# Patient Record
Sex: Male | Born: 1935
Health system: Southern US, Community
[De-identification: ages and names within clinical notes are randomized; demographics above are authoritative.]

## PROBLEM LIST (undated history)

## (undated) DIAGNOSIS — Z950 Presence of cardiac pacemaker: Secondary | ICD-10-CM

## (undated) DIAGNOSIS — H269 Unspecified cataract: Secondary | ICD-10-CM

## (undated) DIAGNOSIS — C801 Malignant (primary) neoplasm, unspecified: Secondary | ICD-10-CM

## (undated) DIAGNOSIS — I2510 Atherosclerotic heart disease of native coronary artery without angina pectoris: Secondary | ICD-10-CM

## (undated) DIAGNOSIS — Z8719 Personal history of other diseases of the digestive system: Secondary | ICD-10-CM

## (undated) DIAGNOSIS — N189 Chronic kidney disease, unspecified: Secondary | ICD-10-CM

## (undated) DIAGNOSIS — G4733 Obstructive sleep apnea (adult) (pediatric): Secondary | ICD-10-CM

## (undated) DIAGNOSIS — E785 Hyperlipidemia, unspecified: Secondary | ICD-10-CM

## (undated) DIAGNOSIS — I10 Essential (primary) hypertension: Secondary | ICD-10-CM

## (undated) DIAGNOSIS — G4730 Sleep apnea, unspecified: Secondary | ICD-10-CM

## (undated) DIAGNOSIS — C4490 Unspecified malignant neoplasm of skin, unspecified: Secondary | ICD-10-CM

## (undated) DIAGNOSIS — I4891 Unspecified atrial fibrillation: Secondary | ICD-10-CM

## (undated) DIAGNOSIS — M1990 Unspecified osteoarthritis, unspecified site: Secondary | ICD-10-CM

## (undated) DIAGNOSIS — K219 Gastro-esophageal reflux disease without esophagitis: Secondary | ICD-10-CM

## (undated) HISTORY — DX: Personal history of other diseases of the digestive system: Z87.19

## (undated) HISTORY — PX: JOINT REPLACEMENT: SHX530

## (undated) HISTORY — DX: Chronic kidney disease, unspecified: N18.9

## (undated) HISTORY — PX: UPPER GASTROINTESTINAL ENDOSCOPY: SHX188

## (undated) HISTORY — PX: CORONARY ARTERY BYPASS GRAFT: SHX141

## (undated) HISTORY — PX: TONSILLECTOMY: SUR1361

## (undated) HISTORY — PX: EYE SURGERY: SHX253

## (undated) HISTORY — PX: COLONOSCOPY: SHX174

## (undated) HISTORY — DX: Essential (primary) hypertension: I10

## (undated) HISTORY — DX: Unspecified atrial fibrillation: I48.91

## (undated) HISTORY — DX: Presence of cardiac pacemaker: Z95.0

## (undated) HISTORY — DX: Unspecified malignant neoplasm of skin, unspecified: C44.90

## (undated) HISTORY — DX: Gastro-esophageal reflux disease without esophagitis: K21.9

## (undated) HISTORY — DX: Malignant (primary) neoplasm, unspecified: C80.1

## (undated) HISTORY — DX: Unspecified osteoarthritis, unspecified site: M19.90

## (undated) HISTORY — DX: Obstructive sleep apnea (adult) (pediatric): G47.33

## (undated) HISTORY — PX: SPINE SURGERY: SHX786

## (undated) HISTORY — DX: Unspecified cataract: H26.9

## (undated) HISTORY — DX: Atherosclerotic heart disease of native coronary artery without angina pectoris: I25.10

## (undated) HISTORY — DX: Hyperlipidemia, unspecified: E78.5

## (undated) HISTORY — DX: Sleep apnea, unspecified: G47.30

---

## 2014-08-31 ENCOUNTER — Ambulatory Visit: Admit: 2014-08-31 | Payer: MEDICARE | Attending: Adult Health

## 2014-08-31 DIAGNOSIS — M48061 Spinal stenosis, lumbar region without neurogenic claudication: Secondary | ICD-10-CM

## 2014-08-31 LAB — ABO/RH: Rh Type: POSITIVE

## 2014-08-31 LAB — CBC
Hematocrit: 45.2 % (ref 38.5–50.0)
Hemoglobin: 15.1 g/dL (ref 13.2–17.1)
MCH: 33.7 pg (ref 27.0–33.0)
MCHC: 33.4 g/dL (ref 32.0–36.0)
MCV: 100.9 fL (ref 80.0–100.0)
MPV: 7.9 fL (ref 7.5–11.5)
Platelets: 180 10*3/uL (ref 140–400)
RBC: 4.47 10*6/uL (ref 4.20–5.80)
RDW: 12.9 % (ref 11.0–15.0)
WBC: 6.7 10*3/uL (ref 3.8–10.8)

## 2014-08-31 LAB — URINALYSIS, REFLEX TO CULTURE
Bilirubin, UA: NEGATIVE
Blood, UA: NEGATIVE
Glucose, UA: NEGATIVE mg/dL
Ketones, UA: NEGATIVE mg/dL
Leukocytes, UA: NEGATIVE
Nitrite, UA: NEGATIVE
Protein, UA: NEGATIVE mg/dL
Specific Gravity, UA: 1.015 (ref 1.005–1.035)
Urobilinogen, UA: <2 mg/dL (ref 0.2–1.9)
pH, UA: 7 (ref 5.0–8.0)

## 2014-08-31 LAB — BASIC METABOLIC PANEL
Anion Gap: 6 mmol/L (ref 3–16)
BUN: 22 mg/dL (ref 7–25)
CO2: 28 mmol/L (ref 21–33)
Calcium: 9.5 mg/dL (ref 8.6–10.3)
Chloride: 105 mmol/L (ref 98–110)
Creatinine: 1.14 mg/dL (ref 0.60–1.30)
GFR MDRD Af Amer: 75 See note.
GFR MDRD Non Af Amer: 62 See note.
Glucose: 98 mg/dL (ref 70–100)
Osmolality, Calculated: 291 mOsm/kg (ref 278–305)
Potassium: 4 mmol/L (ref 3.5–5.3)
Sodium: 139 mmol/L (ref 133–146)

## 2014-08-31 LAB — DIFFERENTIAL
Basophils Absolute: 13 /uL (ref 0–200)
Basophils Relative: 0.2 % (ref 0.0–1.0)
Eosinophils Absolute: 235 /uL (ref 15–500)
Eosinophils Relative: 3.5 % (ref 0.0–8.0)
Lymphocytes Absolute: 1869 /uL (ref 850–3900)
Lymphocytes Relative: 27.9 % (ref 15.0–45.0)
Monocytes Absolute: 744 /uL (ref 200–950)
Monocytes Relative: 11.1 % (ref 0.0–12.0)
Neutrophils Absolute: 3839 /uL (ref 1500–7800)
Neutrophils Relative: 57.3 % (ref 40.0–80.0)

## 2014-08-31 LAB — PROTIME-INR
INR: 1 (ref 0.9–1.1)
Protime: 12.8 seconds (ref 11.6–14.4)

## 2014-08-31 LAB — MRSA/STAPH AUREUS DNA - PRE-SURGICAL SCREEN ONLY
MRSA, PCR: NEGATIVE
Staph Aureus, PCR: NEGATIVE

## 2014-08-31 LAB — C-REACTIVE PROTEIN: CRP: <1 mg/L (ref 1.0–10.0)

## 2014-08-31 LAB — SED RATE: Sed Rate: 2 mm/hr (ref 0–20)

## 2014-08-31 LAB — APTT: aPTT: 27.3 seconds (ref 24.3–33.1)

## 2014-08-31 LAB — ANTIBODY SCREEN: Antibody Screen: NEGATIVE

## 2014-08-31 NOTE — Unmapped (Signed)
PRE-OPERATIVE HISTORY AND PHYSICAL       Subjective:     [facility] NP / PA:   Estill Batten, NP    Date of Surgery:  09/17/14  Surgeon:  Dr. Carlis Abbott  Diagnosis:  Spinal stenosis of lumbar region without neurogenic claudication  Procedure:  L4-5 decompression with fusion and fixation and (R) synovial cyst resection using minimally invasive techniques    Patient ID: Craig Foley is a 80 y.o. male.    Patient is being seen today at the request of Dr. Carlis Abbott to render an opinion on perioperative risk optimization and to coordinate medical care as necessary prior to the following procedure: L4-5 decompression with fusion and fixation and (R) synovial cyst resection using minimally invasive techniques.    Chief Complaint   Patient presents with   ??? Pre-op Exam     Spinal stenosis of lumbar region without neurogenic claudication       History of Present Illness:  79 yo man with spinal stenosis of lumbar region without neurogenic claudication. Back pain began earlier this year in May. He states he had mild symptoms 6 months prior to that and he started to take Meloxicam. Reports the pain is sciatica type pain and travels down RLE with associated numbness/tingling. He rates current pain at 1/10 in severity and burning and stabbing in quality. He states with ambulation pain can rise to 7-8/10. It is constant in duration. He states standing still or walking exacerbates the pain and only resting improves it. He denies falls and uses no ambulation assist devices. He takes NSAIDs on occasion for the pain with poor relief. He denies N/V, fevers/chills, and weight loss. He is to undergo L4-5 decompression with fusion and fixation and (R) synovial cyst resection using minimally invasive techniques and is seen today in pre-op.    Chronic Medical Conditions, Severity, Optimization:    1. Cardiac risk/functional status: Denies CHF. +CAD with CABG x4 in 2003. He is on ASA, BB, and statin. Per his cardiologist he is OK to hold ASA pre-op. He  follows with Dr. Stephanie Acre cardiologist with [facility] with recent OV 07/2014.  ECHO 06/2012 with EF 55%, patient was in atrial flutter at the time of test. Stress showed no reversible ischemia in 2013 with EF 63%.   1a. Atrial flutter: underwent successful ablation in 2013. He is on BB and ASA 81 mg. EKG 07/2014 SB at 56 bpm with 1st degree AVB.   2. HTN: on atenolol and lisinopril.   3. HLD: on statin.   4. GERD: on omeprazole with good control of symptoms.   5. Dysphagia: states he takes omeprazole for this as well with good control of symptoms but still has dysphagia and choking on occasion. He has had esophageal dilation in the past with the most recent episode in 2010.  6. OSA: on CPAP nightly.     Duke Activity Scale:  4 - Raking leaves; weeding or pushing a power mower.     Medical History:     Past Medical History   Diagnosis Date   ??? Coronary artery disease    ??? Hypertension    ??? HLD (hyperlipidemia)    ??? Atrial flutter    ??? GERD (gastroesophageal reflux disease)    ??? OSA on CPAP    ??? Cancer      skin- BCC on (L) ear     Surgical History:     Past Surgical History   Procedure Laterality Date   ??? Coronary  artery bypass graft  2003     x4   ??? Cardiac electrophysiology study and ablation  2013     atrial flutter   ??? Esophageal dilation  2010   ??? Eye surgery Bilateral      cataracts   ??? Endoscopic release transverse carpal ligament of hand Bilateral    ??? Vein ligation and stripping     ??? Skin cancer excision       Family History:     Family History   Problem Relation Age of Onset   ??? Cancer Mother      breast   ??? Heart failure Father    ??? Diabetes Other      Social History:     History     Social History   ??? Marital Status: Married     Spouse Name: N/A     Number of Children: N/A   ??? Years of Education: N/A     Occupational History   ??? Not on file.     Social History Main Topics   ??? Smoking status: Former Smoker -- 1.00 packs/day for 20 years     Quit date: 11/30/1973   ??? Smokeless tobacco: Not on file   ???  Alcohol Use: 1.2 oz/week     2 Glasses of wine per week   ??? Drug Use: No   ??? Sexual Activity: Not on file     Other Topics Concern   ??? Not on file     Social History Narrative   ??? No narrative on file     Allergies:   No Known Allergies    Medications:     Prior to Admission medications taking for visit date 08/31/14   Medication Sig Taking? Authorizing Provider   atenolol (TENORMIN) 25 MG tablet Take 25 mg by mouth daily. Yes Historical Provider, MD   atorvastatin (LIPITOR) 20 MG tablet Take 20 mg by mouth daily. Yes Historical Provider, MD   lisinopril (PRINIVIL,ZESTRIL) 2.5 MG tablet Take 2.5 mg by mouth daily. Yes Historical Provider, MD   LORazepam (ATIVAN) 1 MG tablet Take 1 mg by mouth 3 times a day as needed for Anxiety. Yes Historical Provider, MD   omeprazole (PRILOSEC) 20 MG capsule Take 20 mg by mouth every morning before breakfast. Yes Historical Provider, MD   aspirin 81 MG EC tablet Take 81 mg by mouth daily.  Historical Provider, MD   coenzyme Q10 (CO Q-10) 10 mg capsule Take 10 mg by mouth daily.  Historical Provider, MD   fish oil-omega-3 fatty acids 300-1,000 mg capsule Take 1 g by mouth daily.  Historical Provider, MD   meloxicam (MOBIC) 15 MG tablet Take 15 mg by mouth daily.  Historical Provider, MD      Review of Systems   Constitutional: Positive for activity change. Negative for fever, chills, weight loss, weight gain, appetite change and fatigue.   HENT: Positive for dental problem, tinnitus and trouble swallowing. Negative for congestion, ear pain, hearing loss, mouth sores, nosebleeds, postnasal drip, rhinorrhea, sinus pressure, sneezing and sore throat.         Chronic tinnitus  Missing teeth, none loose  H/o dysphagia- past dilation-omeprazole helps   Eyes: Negative for pain and visual disturbance.   Respiratory: Positive for apnea and choking. Negative for cough, chest tightness, shortness of breath and wheezing.         +OSA- on CPAP  Denies orthopnea- 1 pillow  Choking with esophageal  problem   Cardiovascular:  Negative for chest pain, palpitations and leg swelling.   Gastrointestinal: Positive for heartburn. Negative for nausea, vomiting, abdominal pain, diarrhea, constipation, blood in stool and bloating.        GERD well controlled on PPI   Genitourinary: Negative for dysuria, urgency, frequency, hematuria, decreased urine volume, difficulty urinating and nocturia.   Musculoskeletal: Positive for back pain. Negative for arthralgias, gait problem, neck pain and neck stiffness.   Skin: Negative for rash and wound.   Neurological: Positive for numbness. Negative for dizziness, tremors, seizures, syncope, weakness, light-headedness and headaches.   Hematological: Bruises/bleeds easily.        Bruise easily- on ASA     Objective:   Blood pressure 150/68, pulse 54, temperature 97.7 ??F (36.5 ??C), temperature source Oral, resp. rate 12, height 5' 10 (1.778 m), weight 233 lb (105.688 kg), SpO2 100.00%.    Physical Exam   Constitutional: He is oriented to person, place, and time. He appears well-developed and well-nourished. He is active.  Non-toxic appearance. He does not have a sickly appearance. He does not appear ill. No distress.   Body mass index is 33.43 kg/(m^2).  Wife present   HENT:   Head: Normocephalic and atraumatic.   Mouth/Throat: Uvula is midline, oropharynx is clear and moist and mucous membranes are normal. He does not have dentures. Abnormal dentition.   Missing teeth, none loose per pt   Eyes: Conjunctivae, EOM and lids are normal. Pupils are equal, round, and reactive to light. No scleral icterus.   Neck: Normal range of motion. Neck supple. Carotid bruit is not present. Normal range of motion present. No thyromegaly present.   Cardiovascular: Regular rhythm, S1 normal, S2 normal and normal heart sounds.  Bradycardia present.    No murmur heard.  Pulses:       Radial pulses are 2+ on the right side, and 2+ on the left side.        Posterior tibial pulses are 2+ on the right side,  and 2+ on the left side.   Trace ankle edema (L) > (R)   Pulmonary/Chest: Effort normal and breath sounds normal.   Abdominal: Soft. Bowel sounds are normal. There is no tenderness.   Musculoskeletal:   Strength 5/5 BUE/BLE   Lymphadenopathy:        Head (right side): No submental, no submandibular, no tonsillar, no preauricular, no posterior auricular and no occipital adenopathy present.        Head (left side): No submental, no submandibular, no tonsillar, no preauricular, no posterior auricular and no occipital adenopathy present.     He has no cervical adenopathy.   Neurological: He is alert and oriented to person, place, and time. He has normal strength. No cranial nerve deficit or sensory deficit. GCS eye subscore is 4. GCS verbal subscore is 5. GCS motor subscore is 6.   Skin: Skin is warm, dry and intact.   Psychiatric: He has a normal mood and affect. His speech is normal and behavior is normal. Judgment and thought content normal. Cognition and memory are normal.       Airway:  Mallampati III (soft and hard palate and base of uvula visible), Thyromental distance 2 finger breadths, opening 3 finger breadths. Slightly decreased neck extension. Missing teeth, none loose.     Lab Review:     Lab Results   Component Value Date    WBC 6.7 08/31/2014     Study Results:    TEE 07/2012:  Findings:  Aortic root  normal size   Aortic valve morphology normal trace regurgitation  Mitral and tricuspid valve morphology normal  Mild mitral regurgitation,two small jets noted  No tricuspid regurgitation  Interatrial septum shows lipomatous hypertrophy no evidence for   PFO On color doppler bubble study not done  L.v. Function normal  No pericardial effusion  Left atrial appendage seen well no evidence for thrombus  Pulmonary vein doppler ,no systolic reversal  IMP; normal lv function  Mild mitral regurgitation  No left atrial appendage thrombus  Recommendations:  proceeed with flutter ablation    TTE 06/2012:  FINDINGS   Real  time imaging shows normal LV size, wall motion and an ejection fraction of 55%. Biatrial enlargement is noted and left atrial enlargement is moderate in severity. There is questionable RV enlargement. Mitral and aortic valves open normally.   Doppler study shows trace mitral and tricuspid insufficiency. PA pressure was not estimated due to lack of adequate Doppler signal.   CONCLUSIONS   1. Normal left ventricular function.  2. Biatrial enlargement, moderate left atrial enlargement.  3. Questionable right ventricular enlargement.  4. No valve disease.   5. Patient is in atrial flutter during this study    Stress 2013:  Impression:  1. Nonischemic clinical response to regadenoson stress.  2. Nonischemic electrocardiographic response to regadenoson stress.  3. Abnormal myocardial perfusion imaging study.  - Small basal inferolateral infarct without ischemia.  Small apicolateral nontransmural infarct without ischemia.  - Normal left ventricular cavity size. LVEDV 107 ML.  - Normal wall motion.  - Ejection fraction was calculated at 63% poststress     ASA Physical Status:  3    Assessment and Recommendations:   80 yo man with  Spinal stenosis of lumbar region without neurogenic claudication seen preoperatively to L4-5 decompression with fusion and fixation and (R) synovial cyst resection using minimally invasive techniques under [place]. Concurrent medical conditions include:    1. Cardiac risk/functional status: Denies CHF. +CAD with CABG x4 in 2003. He is on ASA, BB, and statin. Per his cardiologist he is OK to hold ASA pre-op. He follows with Dr. Stephanie Acre cardiologist with [facility] with recent OV 07/2014.  ECHO 06/2012 with EF 55%, patient was in atrial flutter at the time of test. Stress showed no reversible ischemia in 2013 with EF 63%. Denies CP, SOB, and orthopnea. Duke activity score 4; he has stairs at home and states he can climb 2 flights without symptoms. Until the spring when back pain worsened he was walking  on the treadmill on occasion but not regularly without symptoms. RCRI=1 (ischemic HD).     1a. Atrial flutter: underwent successful ablation in 2013. He is on BB and ASA 81 mg. EKG 07/2014 SB at 56 bpm with 1st degree AVB. HR 54 bpm today and RRR.    2. HTN: on atenolol and lisinopril. BP today 150/68. Advised to take BB AM of OR and to hold ACEI.    3. HLD: on statin. Advised to continue perioperatively.    4. GERD: on omeprazole with good control of symptoms. Advised to take AM of OR.    5. Dysphagia: states he takes omeprazole for this as well with good control of symptoms but still has dysphagia and choking on occasion. He has had esophageal dilation in the past with the most recent episode in 2010.    6. OSA: on CPAP nightly. Advised to bring AM of OR.     This patient's medical history, assessment and plan of  care was discussed with Dr. Monica Becton. No further testing indicated prior to surgery.     Today I obtained T&S, CBC w/diff, BMP, coags, ESR, CRP, MRSA screen, and UA/C&S.  Pre-procedural instructions given, patient verbalized understanding.    Estill Batten, CNP

## 2014-08-31 NOTE — Unmapped (Signed)
Pre-Procedure Instructions    We???re pleased that you have chosen [facility] for your upcoming procedure.  The staff serving you is professionally trained to provide the highest quality care.  We encourage you to ask questions and to let the staff know your special needs.  We want your visit to be as comfortable as possible.    Your procedure is scheduled on 10/19 at 1230 PM.  Please arrive at 1030 AM and check in at the surgery waiting room on the second floor of the main hospital.      DO NOT EAT OR DRINK ANYTHING (including gum, mints, water, etc.) after midnight the night before your procedure.  You may brush your teeth and gargle on the morning of surgery, but do not swallow any water with the exception of the following medication: Atenolol (with a small sip of water).    YOU MAY TAKE YOUR GERD, SEIZURE, ANXIETY/DEPRESSION AND PAIN MEDICATIONS, WITH A SMALL SIP OF WATER, DAY OF SURGERY, AS PRESCRIBED BY YOUR PHYSICIANS.  FOLLOW YOUR PHYSICIAN'S INSTRUCTIONS CONCERNING DISCONTINUING ASPIRIN, IBUPROFEN, NSAIDS, SUPPLEMENTS, FISH OIL, VITAMINS, AND HERBAL SUPPLEMENTS.    Please make transportation arrangements and bring a responsible adult to accompany you home and remain with you for 24 hours.    Leave valuables (money, jewelry, credit cards) at home.  If you wear glasses or contacts, bring a case for safekeeping.    Wear casual, loose fitting, and comfortable clothing.  A gown will be provided.  If you are staying overnight, bring a small overnight bag.  (Storage space is limited.)    Please remove all makeup, jewelry, body piercings, powder, perfume, and nail polish before you arrive.    Bring a list of your medications and dose including herbal.  Do not bring any pills or medications to the hospital. (Exception: transplant patients.)    Bring a photo ID and your insurance card so we can bill your insurance company directly.    Please do not bring any children under the age of 38 to the hospital.     Do not shave in the area of the surgery for 2 days prior to surgery.  If needed, a trained staff member will clip the area immediately before your surgery.    Quit smoking as far in advance of surgery as possible.  Patients who quit at least 30 days before surgery may have better outcomes.    If you are diabetic, pay close attention to your blood sugar and try to keep it in the range your doctor wants it to be in.      Discuss discontinuing herbal medications with your doctor before surgery.    Talk to your doctor about taking medication such as Aspirin, Plavix, Pradaxa or Coumadin before surgery.    Please shower at home the evening before and the morning of surgery using an antibacterial soap.    If you have a cold or are sick prior to surgery, contact your doctor before surgery.    Additional instructions:    Patient/Family provided education about surgical site infection prevention.    IF, you are positive for Staph, &/or possibly MRSA, you will need to use Bactroban intranasally for 3 days prior to surgery.  Bactroban is used twice a day in each nares for 3 days prior to surgery.  You will be contacted by a Pre Admission Testing RN at [facility] to notify you of this, and a script will be called in to your pharmacy.  You will be provided Hibiclens for preop shower. You will use this soap to your operative site for 3 days in row prior to surgery.    Patient verbalized understanding of these instructions.    1. Enter main entrance of hospital.   2. Go straight back to set of elevators.  3. Take elevator to the second floor.  4. Left off elevator to the Surgical Services Desk.    Make sure all of your health care givers are checking your ID bracelet and verifying your name and date of birth.  You will actively be involved in verifying the type of surgery you are having and the correct site.  Your health care givers should be cleaning their hands with soap and water or antibacterial foam before taking  care of you and if they do not it is ok to remind them to do so.      Antibacterial showering and good hand hygiene are essential to prevent surgical site infections.    Patient given educational information/material on preventing surgical site infections.    Patient verbalized understanding of these instructions.    Contact information:    [facility] Pre-admission Testing, Monday - Friday 8:00 am - 4:30 pm, [phone].

## 2014-09-17 ENCOUNTER — Inpatient Hospital Stay
Admit: 2014-09-17 | Discharge: 2014-09-19 | Disposition: A | Discharge status: Home Health | Payer: MEDICARE | Source: Ambulatory Visit

## 2014-09-17 ENCOUNTER — Inpatient Hospital Stay: Admit: 2014-09-17 | Payer: MEDICARE

## 2014-09-17 ENCOUNTER — Inpatient Hospital Stay: Admit: 2014-09-18 | Payer: MEDICARE

## 2014-09-17 DIAGNOSIS — M4806 Spinal stenosis, lumbar region: Secondary | ICD-10-CM

## 2014-09-17 MED ORDER — phenylephrine (NEO-SYNEPHRINE) injection
10 | INTRAMUSCULAR | Status: AC | PRN
Start: 2014-09-17 — End: 2014-09-17
  Administered 2014-09-17: 20:00:00 100 via INTRAVENOUS
  Administered 2014-09-17: 21:00:00 200 via INTRAVENOUS
  Administered 2014-09-17: 20:00:00 100 via INTRAVENOUS
  Administered 2014-09-17: 22:00:00 200 via INTRAVENOUS
  Administered 2014-09-17: 19:00:00 50 via INTRAVENOUS
  Administered 2014-09-17 (×2): 200 via INTRAVENOUS
  Administered 2014-09-17 (×2): 50 via INTRAVENOUS
  Administered 2014-09-17 (×3): 200 via INTRAVENOUS
  Administered 2014-09-17: 20:00:00 100 via INTRAVENOUS
  Administered 2014-09-17: 20:00:00 200 via INTRAVENOUS

## 2014-09-17 MED ORDER — oxyCODONE-acetaminophen (PERCOCET) 5-325 mg per tablet 2 tablet
5-325 | ORAL | Status: AC | PRN
Start: 2014-09-17 — End: 2014-09-19
  Administered 2014-09-18 – 2014-09-19 (×3): 2 via ORAL

## 2014-09-17 MED ORDER — flosealhemostaticsealant
Status: AC
Start: 2014-09-17 — End: 2014-09-17

## 2014-09-17 MED ORDER — LORazepam (ATIVAN) tablet 1 mg
1 | Freq: Three times a day (TID) | ORAL | Status: AC | PRN
Start: 2014-09-17 — End: 2014-09-19

## 2014-09-17 MED ORDER — lisinopril (PRINIVIL,ZESTRIL) tablet 2.5 mg
2.5 | Freq: Every day | ORAL | Status: AC
Start: 2014-09-17 — End: 2014-09-19
  Administered 2014-09-19: 14:00:00 2.5 mg via ORAL

## 2014-09-17 MED ORDER — vancomycin (VANCOCIN) injection
1000 | INTRAVENOUS | Status: AC
Start: 2014-09-17 — End: 2014-09-17

## 2014-09-17 MED ORDER — thrombin (bovine) SolR
5000 | TOPICAL | Status: AC | PRN
Start: 2014-09-17 — End: 2014-09-17
  Administered 2014-09-17: 21:00:00 5000

## 2014-09-17 MED ORDER — atorvastatin (LIPITOR) tablet 20 mg
20 | Freq: Every evening | ORAL | Status: AC
Start: 2014-09-17 — End: 2014-09-19
  Administered 2014-09-19: 01:00:00 20 mg via ORAL

## 2014-09-17 MED ORDER — lactated ringers irrigation solution
Status: AC | PRN
Start: 2014-09-17 — End: 2014-09-17
  Administered 2014-09-17: 21:00:00 1000

## 2014-09-17 MED ORDER — remifentanil (ULTIVA) 1 mg in sodium chloride 0.9 % 20 mL IV
2 | INTRAVENOUS | Status: AC | PRN
Start: 2014-09-17 — End: 2014-09-17
  Administered 2014-09-17: 21:00:00 0.1 mg via INTRAVENOUS

## 2014-09-17 MED ORDER — ondansetron (ZOFRAN) 4 mg/2 mL injection 4 mg
4 | Freq: Three times a day (TID) | INTRAMUSCULAR | Status: AC | PRN
Start: 2014-09-17 — End: 2014-09-19
  Administered 2014-09-18 (×2): 4 mg via INTRAVENOUS

## 2014-09-17 MED ORDER — lactated ringers infusion
INTRAVENOUS | Status: AC
Start: 2014-09-17 — End: 2014-09-17
  Administered 2014-09-17: 16:00:00 20 mL/h via INTRAVENOUS
  Administered 2014-09-17: 19:00:00 via INTRAVENOUS

## 2014-09-17 MED ORDER — phenylephrine (NEO-SYNEPHRINE) 10 mg/mL injection
10 | INTRAMUSCULAR | Status: AC
Start: 2014-09-17 — End: ?

## 2014-09-17 MED ORDER — lidocaine-EPINEPHrine 1 %-1:100,000 injection
1 | INTRAMUSCULAR | Status: AC | PRN
Start: 2014-09-17 — End: 2014-09-17
  Administered 2014-09-17: 19:00:00 10

## 2014-09-17 MED ORDER — propofol 10 mg/ml (DIPRIVAN) 10 mg/mL injection
10 | INTRAVENOUS | Status: AC
Start: 2014-09-17 — End: ?

## 2014-09-17 MED ORDER — methocarbamol (ROBAXIN) injection 1,000 mg
100 | Freq: Four times a day (QID) | INTRAMUSCULAR | Status: AC
Start: 2014-09-17 — End: 2014-09-18
  Administered 2014-09-18 (×4): 1000 mg via INTRAVENOUS

## 2014-09-17 MED ORDER — ceFAZolin (ANCEF) IVPB 2 g in D5W (duplex)
2 | Freq: Three times a day (TID) | INTRAVENOUS | Status: AC
Start: 2014-09-17 — End: 2014-09-18
  Administered 2014-09-18 (×2): 2 g via INTRAVENOUS

## 2014-09-17 MED ORDER — HYDROmorphone (DILAUDID) injection Syrg
2 | INTRAMUSCULAR | Status: AC | PRN
Start: 2014-09-17 — End: 2014-09-17
  Administered 2014-09-17 (×3): 1 via INTRAVENOUS

## 2014-09-17 MED ORDER — atenolol (TENORMIN) tablet 25 mg
25 | Freq: Every day | ORAL | Status: AC
Start: 2014-09-17 — End: 2014-09-19
  Administered 2014-09-19: 14:00:00 25 mg via ORAL

## 2014-09-17 MED ORDER — EPINEPHRINE 50MCG/10ML INTRA-OP SYRINGE FOR BRADYCARDIA
Status: AC
Start: 2014-09-17 — End: ?

## 2014-09-17 MED ORDER — bacitracin 50,000 unit injection
50000 | INTRAMUSCULAR | Status: AC
Start: 2014-09-17 — End: 2014-09-17

## 2014-09-17 MED ORDER — HYDROmorphone (DILAUDID) injection Syrg 0.5 mg
0.5 | INTRAMUSCULAR | Status: AC | PRN
Start: 2014-09-17 — End: 2014-09-19
  Administered 2014-09-18: 15:00:00 0.5 mg via INTRAVENOUS

## 2014-09-17 MED ORDER — HYDROmorphone (DILAUDID) 2 mg/mL injection Syrg
2 | INTRAMUSCULAR | Status: AC
Start: 2014-09-17 — End: ?

## 2014-09-17 MED ORDER — lactated ringers infusion
INTRAVENOUS | Status: AC
Start: 2014-09-17 — End: 2014-09-17

## 2014-09-17 MED ORDER — EPINEPHRINE 50 MCG/ 10 ML SYRINGE FOR BRADYCARDIA
Status: AC | PRN
Start: 2014-09-17 — End: 2014-09-17
  Administered 2014-09-17 (×3): 10 via INTRAVENOUS

## 2014-09-17 MED ORDER — fentaNYL (SUBLIMAZE) injection 50 mcg
50 | INTRAMUSCULAR | Status: AC | PRN
Start: 2014-09-17 — End: 2014-09-17

## 2014-09-17 MED ORDER — senna (SENOKOT) tablet 1 tablet
8.6 | Freq: Two times a day (BID) | ORAL | Status: AC
Start: 2014-09-17 — End: 2014-09-19
  Administered 2014-09-19 (×2): 1 via ORAL

## 2014-09-17 MED ORDER — HYDROmorphone (DILAUDID) injection Syrg 0.2 mg
0.5 | INTRAMUSCULAR | Status: AC | PRN
Start: 2014-09-17 — End: 2014-09-17

## 2014-09-17 MED ORDER — glycopyrrolate (ROBINUL) injection
0.2 | INTRAMUSCULAR | Status: AC | PRN
Start: 2014-09-17 — End: 2014-09-17
  Administered 2014-09-17 (×2): 0.2 via INTRAVENOUS

## 2014-09-17 MED ORDER — sodium phosphates (FLEET'S) Enema 118 mL
19-7 | Freq: Every day | RECTAL | Status: AC | PRN
Start: 2014-09-17 — End: 2014-09-19

## 2014-09-17 MED ORDER — thrombin-fibrinogen (TISSEEL-VH) 4 mL Kit
4 | TOPICAL | Status: AC | PRN
Start: 2014-09-17 — End: 2014-09-17
  Administered 2014-09-17: 21:00:00 10

## 2014-09-17 MED ORDER — HYDROmorphone (DILAUDID) injection Syrg 0.4 mg
0.5 | INTRAMUSCULAR | Status: AC | PRN
Start: 2014-09-17 — End: 2014-09-17
  Administered 2014-09-17 (×2): 0.4 mg via INTRAVENOUS

## 2014-09-17 MED ORDER — fentaNYL (SUBLIMAZE) injection
50 | INTRAMUSCULAR | Status: AC | PRN
Start: 2014-09-17 — End: 2014-09-17
  Administered 2014-09-17: 19:00:00 100 via INTRAVENOUS

## 2014-09-17 MED ORDER — ondansetron (ZOFRAN) tablet 4 mg
4 | Freq: Three times a day (TID) | ORAL | Status: AC | PRN
Start: 2014-09-17 — End: 2014-09-19

## 2014-09-17 MED ORDER — floseal hemostatic sealant
Status: AC | PRN
Start: 2014-09-17 — End: 2014-09-17
  Administered 2014-09-17: 19:00:00 10 via TOPICAL

## 2014-09-17 MED ORDER — ceFAZolin (ANCEF) IVPB 2 g in D5W (duplex)
2 | INTRAVENOUS | Status: AC | PRN
Start: 2014-09-17 — End: 2014-09-17
  Administered 2014-09-17: 19:00:00 2 g via INTRAVENOUS

## 2014-09-17 MED ORDER — methocarbamol (ROBAXIN) injection
100 | INTRAMUSCULAR | Status: AC | PRN
Start: 2014-09-17 — End: 2014-09-17
  Administered 2014-09-17: 22:00:00 1000 via INTRAVENOUS

## 2014-09-17 MED ORDER — zolpidem (AMBIEN) tablet 5 mg
5 | Freq: Every evening | ORAL | Status: AC | PRN
Start: 2014-09-17 — End: 2014-09-19

## 2014-09-17 MED ORDER — lactated ringers infusion
INTRAVENOUS | Status: AC
Start: 2014-09-17 — End: ?

## 2014-09-17 MED ORDER — lidocaine (PF) 2% (20 mg/mL) 20 mg/mL (2 %) Soln
20 | INTRAMUSCULAR | Status: AC
Start: 2014-09-17 — End: ?

## 2014-09-17 MED ORDER — sodium chloride 0.9 % infusion
INTRAVENOUS | Status: AC
Start: 2014-09-17 — End: ?

## 2014-09-17 MED ORDER — oxyCODONE-acetaminophen (PERCOCET) 5-325 mg per tablet 1 tablet
5-325 | ORAL | Status: AC | PRN
Start: 2014-09-17 — End: 2014-09-19

## 2014-09-17 MED ORDER — lidocaine (PF) 20 mg/mL (2 %) Soln
20 | INTRAVENOUS | Status: AC | PRN
Start: 2014-09-17 — End: 2014-09-17
  Administered 2014-09-17: 19:00:00 60 via INTRAVENOUS

## 2014-09-17 MED ORDER — rocuronium (ZEMURON) 10 mg/mL injection
10 | INTRAVENOUS | Status: AC
Start: 2014-09-17 — End: ?

## 2014-09-17 MED ORDER — succinylcholine (QUELICIN) injection
20 | INTRAMUSCULAR | Status: AC | PRN
Start: 2014-09-17 — End: 2014-09-17
  Administered 2014-09-17: 19:00:00 100 via INTRAVENOUS

## 2014-09-17 MED ORDER — succinylcholine (QUELICIN) 20 mg/mL injection
20 | INTRAMUSCULAR | Status: AC
Start: 2014-09-17 — End: ?

## 2014-09-17 MED ORDER — fentaNYL (SUBLIMAZE) injection 12.5 mcg
50 | INTRAMUSCULAR | Status: AC | PRN
Start: 2014-09-17 — End: 2014-09-17

## 2014-09-17 MED ORDER — gelatin adsorbable (GELFOAM) 100 sponge
100 | TOPICAL | Status: AC
Start: 2014-09-17 — End: 2014-09-17

## 2014-09-17 MED ORDER — phenylephrine (NEO-SYNEPHRINE) 10 mg in sodium chloride 0.9 % 250 mL infusion
10 | INTRAMUSCULAR | Status: AC | PRN
Start: 2014-09-17 — End: 2014-09-17
  Administered 2014-09-17: 21:00:00 70 mg via INTRAVENOUS

## 2014-09-17 MED ORDER — methocarbamol (ROBAXIN) tablet 500-1,000 mg
500 | Freq: Four times a day (QID) | ORAL | Status: AC | PRN
Start: 2014-09-17 — End: 2014-09-19

## 2014-09-17 MED ORDER — sodium chloride 0.9% 0.9 %
INTRAMUSCULAR | Status: AC
Start: 2014-09-17 — End: ?

## 2014-09-17 MED ORDER — 0.9 % NaCl with KCl 20 mEq infusion
20 | INTRAVENOUS | Status: AC
Start: 2014-09-17 — End: 2014-09-19
  Administered 2014-09-18 (×2): 75 mL/h via INTRAVENOUS

## 2014-09-17 MED ORDER — docusate sodium (COLACE) capsule 100 mg
100 | Freq: Two times a day (BID) | ORAL | Status: AC
Start: 2014-09-17 — End: 2014-09-19
  Administered 2014-09-19 (×2): 100 mg via ORAL

## 2014-09-17 MED ORDER — propofol 10 mg/ml (DIPRIVAN) injection
10 | INTRAVENOUS | Status: AC | PRN
Start: 2014-09-17 — End: 2014-09-17
  Administered 2014-09-17: 19:00:00 20 via INTRAVENOUS
  Administered 2014-09-17: 19:00:00 150 via INTRAVENOUS

## 2014-09-17 MED ORDER — ondansetron (ZOFRAN) 4 mg/2 mL injection
4 | INTRAMUSCULAR | Status: AC
Start: 2014-09-17 — End: ?

## 2014-09-17 MED ORDER — ondansetron (ZOFRAN) 4 mg/2 mL injection 4 mg
4 | Freq: Three times a day (TID) | INTRAMUSCULAR | Status: AC | PRN
Start: 2014-09-17 — End: 2014-09-17

## 2014-09-17 MED ORDER — HYDROmorphone (DILAUDID) injection Syrg 0.6 mg
1 | INTRAMUSCULAR | Status: AC | PRN
Start: 2014-09-17 — End: 2014-09-17
  Administered 2014-09-17 (×2): 0.6 mg via INTRAVENOUS

## 2014-09-17 MED ORDER — heparin (porcine) injection 5,000 Units
5000 | Freq: Three times a day (TID) | INTRAMUSCULAR | Status: AC
Start: 2014-09-17 — End: 2014-09-19
  Administered 2014-09-18 – 2014-09-19 (×5): 5000 [IU] via SUBCUTANEOUS

## 2014-09-17 MED ORDER — thrombin-fibrinogen (TISSEEL-VH) 10 mL Kit
10 | TOPICAL | Status: AC
Start: 2014-09-17 — End: 2014-09-17

## 2014-09-17 MED ORDER — HYDROmorphone (DILAUDID) injection Syrg 1 mg
1 | INTRAMUSCULAR | Status: AC | PRN
Start: 2014-09-17 — End: 2014-09-19
  Administered 2014-09-18 – 2014-09-19 (×4): 1 mg via INTRAVENOUS

## 2014-09-17 MED ORDER — fentaNYL (SUBLIMAZE) injection 25 mcg
50 | INTRAMUSCULAR | Status: AC | PRN
Start: 2014-09-17 — End: 2014-09-17
  Administered 2014-09-17 (×3): 25 ug via INTRAVENOUS

## 2014-09-17 MED ORDER — bisacodyl (DULCOLAX) EC tablet 10 mg
5 | Freq: Every day | ORAL | Status: AC | PRN
Start: 2014-09-17 — End: 2014-09-19

## 2014-09-17 MED ORDER — bupivacaine (PF)(SENSORCAINE/MARCAINE) 0.5% injection
0.5 | INTRAMUSCULAR | Status: AC | PRN
Start: 2014-09-17 — End: 2014-09-17
  Administered 2014-09-17: 19:00:00 20
  Administered 2014-09-17: 22:00:00 30 via SUBCUTANEOUS

## 2014-09-17 MED ORDER — nalOXone (NARCAN) injection 0.04 mg
0.4 | INTRAMUSCULAR | Status: AC | PRN
Start: 2014-09-17 — End: 2014-09-17

## 2014-09-17 MED ORDER — methocarbamol (ROBAXIN) 100 mg/mL injection
100 | INTRAMUSCULAR | Status: AC
Start: 2014-09-17 — End: ?

## 2014-09-17 MED ORDER — gelatin adsorbable (GELFOAM) sponge
100 | TOPICAL | Status: AC | PRN
Start: 2014-09-17 — End: 2014-09-17
  Administered 2014-09-17 (×2): 1 via TOPICAL

## 2014-09-17 MED ORDER — bacitracin 50,000 Units in sodium chloride, irrigation 0.9 % 1,000 mL IRRIGATION
50000 | INTRAMUSCULAR | Status: AC | PRN
Start: 2014-09-17 — End: 2014-09-17
  Administered 2014-09-17: 19:00:00 50000 [IU]

## 2014-09-17 MED ORDER — magnesium hydroxide (MILK OF MAGNESIA) Susp 10 mL
2400 | Freq: Two times a day (BID) | ORAL | Status: AC | PRN
Start: 2014-09-17 — End: 2014-09-19

## 2014-09-17 MED ORDER — remifentanil (ULTIVA) 1 mg injection
1 | INTRAVENOUS | Status: AC
Start: 2014-09-17 — End: ?

## 2014-09-17 MED ORDER — sodium chloride 0.9 % flush 10 mL
INTRAMUSCULAR | Status: AC
Start: 2014-09-17 — End: 2014-09-19
  Administered 2014-09-18 – 2014-09-19 (×5): 10 mL via INTRAVENOUS

## 2014-09-17 MED ORDER — rocuronium (ZEMURON) injection
10 | INTRAVENOUS | Status: AC | PRN
Start: 2014-09-17 — End: 2014-09-17
  Administered 2014-09-17: 19:00:00 20 via INTRAVENOUS

## 2014-09-17 MED ORDER — sodium chloride 0.9 % infusion
INTRAVENOUS | Status: AC
Start: 2014-09-17 — End: 2014-09-17

## 2014-09-17 MED ORDER — glycopyrrolate (ROBINUL) 0.2 mg/mL injection
0.2 | INTRAMUSCULAR | Status: AC
Start: 2014-09-17 — End: ?

## 2014-09-17 MED ORDER — pantoprazole (PROTONIX) EC tablet 40 mg
40 | Freq: Every day | ORAL | Status: AC
Start: 2014-09-17 — End: 2014-09-19
  Administered 2014-09-18 – 2014-09-19 (×2): 40 mg via ORAL

## 2014-09-17 MED ORDER — calcium carbonate (TUMS) chewable tablet 1,000 mg
200 | ORAL | Status: AC | PRN
Start: 2014-09-17 — End: 2014-09-19

## 2014-09-17 MED ORDER — thrombin (bovine) 5,000 unit SolR
5000 | TOPICAL | Status: AC
Start: 2014-09-17 — End: 2014-09-17

## 2014-09-17 MED FILL — POTASSIUM CHLORIDE 20 MEQ/L IN 0.9 % SODIUM CHLORIDE INTRAVENOUS: 20 20 mEq/L | INTRAVENOUS | Qty: 1000

## 2014-09-17 MED FILL — PHENYLEPHRINE 10 MG/ML INJECTION SOLUTION: 10 10 mg/mL | INTRAMUSCULAR | Qty: 1

## 2014-09-17 MED FILL — BACITRACIN 50,000 UNIT INTRAMUSCULAR SOLUTION: 50000 50,000 unit | INTRAMUSCULAR | Qty: 1

## 2014-09-17 MED FILL — HYDROMORPHONE 2 MG/ML INJECTION SYRINGE: 2 2 mg/mL | INTRAMUSCULAR | Qty: 1

## 2014-09-17 MED FILL — SODIUM CHLORIDE 0.9 % INJECTION SOLUTION: INTRAMUSCULAR | Qty: 10

## 2014-09-17 MED FILL — ROCURONIUM 10 MG/ML INTRAVENOUS SOLUTION: 10 10 mg/mL | INTRAVENOUS | Qty: 5

## 2014-09-17 MED FILL — LACTATED RINGERS INTRAVENOUS SOLUTION: INTRAVENOUS | Qty: 1000

## 2014-09-17 MED FILL — HYDROMORPHONE 1 MG/ML INJECTION SYRINGE: 1 1 mg/mL | INTRAMUSCULAR | Qty: 1

## 2014-09-17 MED FILL — CEFAZOLIN 2 GRAM/50 ML IN DEXTROSE (ISO-OSMOTIC) INTRAVENOUS PIGGYBACK: 2 2 gram/50 mL | INTRAVENOUS | Qty: 50

## 2014-09-17 MED FILL — LACTATED RINGERS INTRAVENOUS SOLUTION: 20.00 20.00 mL/hr | INTRAVENOUS | Qty: 1000

## 2014-09-17 MED FILL — FLOSEAL HEMOSTATIC SEALANT: Qty: 10

## 2014-09-17 MED FILL — ONDANSETRON HCL (PF) 4 MG/2 ML INJECTION SOLUTION: 4 4 mg/2 mL | INTRAMUSCULAR | Qty: 2

## 2014-09-17 MED FILL — ROBAXIN 100 MG/ML INJECTION SOLUTION: 100 100 mg/mL | INTRAMUSCULAR | Qty: 10

## 2014-09-17 MED FILL — PROPOFOL 10 MG/ML INTRAVENOUS EMULSION: 10 10 mg/mL | INTRAVENOUS | Qty: 20

## 2014-09-17 MED FILL — LIDOCAINE (PF) 20 MG/ML (2 %) INJECTION SOLUTION: 20 20 mg/mL (2 %) | INTRAMUSCULAR | Qty: 5

## 2014-09-17 MED FILL — QUELICIN 20 MG/ML INJECTION SOLUTION: 20 20 mg/mL | INTRAMUSCULAR | Qty: 10

## 2014-09-17 MED FILL — EPINEPHRINE 50MCG/10ML INTRA-OP SYRINGE FOR BRADYCARDIA: Qty: 10

## 2014-09-17 MED FILL — SODIUM CHLORIDE 0.9 % INTRAVENOUS SOLUTION: INTRAVENOUS | Qty: 250

## 2014-09-17 MED FILL — ULTIVA 1 MG INTRAVENOUS SOLUTION: 1 1 mg | INTRAVENOUS | Qty: 1

## 2014-09-17 MED FILL — FENTANYL (PF) 50 MCG/ML INJECTION SOLUTION: 50 50 mcg/mL | INTRAMUSCULAR | Qty: 2

## 2014-09-17 MED FILL — VANCOMYCIN 1,000 MG INTRAVENOUS INJECTION: 1000 1000 mg | INTRAVENOUS | Qty: 1000

## 2014-09-17 MED FILL — THROMBIN-JMI 5,000 UNIT TOPICAL SOLUTION: 5000 5,000 unit | TOPICAL | Qty: 1

## 2014-09-17 MED FILL — GLYCOPYRROLATE 0.2 MG/ML INJECTION SOLUTION: 0.2 0.2 mg/mL | INTRAMUSCULAR | Qty: 2

## 2014-09-17 MED FILL — TISSEEL VHSD 10 ML TOPICAL KIT: 10 10 mL | TOPICAL | Qty: 1

## 2014-09-17 MED FILL — LACTATED RINGERS INTRAVENOUS SOLUTION: INTRAVENOUS | Qty: 2000

## 2014-09-17 MED FILL — GELFOAM SPONGE SIZE 100 100: 100 | TOPICAL | Qty: 1

## 2014-09-17 NOTE — Unmapped (Signed)
Anesthesia Extubation Criteria:    Airway Device: endotracheal tube    Emergence Details:      Smooth      _x_      Stormy       __       Prolonged   __     Extubation Criteria:      Motor strength intact       _x_      Follows commands        _x_      Good airway reflexes      _x_      OP suctioned                  _x_        Follows commands:  Yes     Patient extubated:  Yes

## 2014-09-17 NOTE — Unmapped (Signed)
Procedures  NEUROMONITORING REPORT  [facility]        PATIENT NAME: Craig Foley  DATE OF BIRTH: [birth year]  MEDICAL RECORD NO.: [id number]  CSN: [id number]  DATE OF PROCEDURE: 09/17/2014    Diagnosis: Lumbar spinal stenosis  Procedure: L4-5 decompression, fusion, fixation, minimally invasive       Anesthetic: Desflurane, Remifentanil  Surgeon: Lonny Prude, MD  Neuromonitoring physician: P. Accalia Rigdon  Monitoring Type: Intraoperative Neurophysiological Monitoring  Monitoring Time: Start 3:02 pm       Stop 5:41 pm  Modalities Requested By Surgeon: SSEP, EMG  Modalities Suggested by Monitoring Team: Same      Brief Clinical History:  The patient is a 80 year old man with back pain radiating down his right leg with associated numbness and tingling.      Assessment:  1. Baseline upper and lower extremity SSEP recordings were well defined and reproducible.  During surgery, there was a transient decrease in the right upper extremity SSEP responses that improved quickly with repositioning. There were no other clinically significant amplitude decreases or latency increases. At the end of surgery amplitude and latencies at all peaks remained near baseline with only slight changes due to anesthetic effect.  2. Free running EMG was recorded during surgery without difficulties. There were no prolonged bursts or trains of neurotonic discharges during surgery.  3. Triggered EMG was performed during surgery without difficulties. No low threshold CMAPs were recorded during pedicle screw stimulation.  Stimulation thresholds were: Left L4 20 mA, L5 20 mA; Right (tap) L4 20 mA, L5 20 mA  4. Train of four EMG responses were monitored during surgery without difficulty.  Amplitudes of the peaks remained similar without fade from the first to the fourth peak at the onset of surgery but were not obtainable during a portion of the case following administration of neuromuscular blockade.  Prior to pedicle screw stimulation adequate  responses were obtained.        Somatosensory Evoked Potentials (SSEP)    Somatosensory pathways were monitored during surgery by somatosensory evoked potentials.  Sensory responses were measured from peripheral nerve, brainstem, and somatosensory cortex.  Stimulation and recording parameters are noted in the technology report. Amplitude and latency were measured at appropriate peaks for each recording montage. The surgeon and anesthesiologist were immediately alerted if any amplitudes decreased by >50% or latencies increased by > 10% from baseline.    Lower Extremities  Frequent intermittent recordings were performed utilizing sequential right and left stimulation of the posterior tibial nerves using needle electrodes at the ankles. Each tracing was produced by an average of approximately 300 stimulations with recording montages PFleft-PFright, CSp2-Fz, C3???-C4??? and Fz-Cz???.    Upper Extremities  Frequent intermittent recordings were performed utilizing sequential right and left stimulation of the ulnar nerves using needle electrodes at the wrists.  Each tracing was produced by an average of approximately 300 stimulations with recording montages EPleft-EPright, CSp2-Fz, C3???-C4???, C3???-Cz??? and C4???-Cz???.          Free Running Electromyography (EMG)    Nerve roots near the spinal cord were monitored continuously during surgery with free running electromyography.  Neurogenic responses were measured from compound muscle action potentials in the upper and lower extremities and trunk. Recording parameters are noted in the technology report. The surgeon was immediately alerted if trains of neurotonic discharges or if high amplitude high frequency bursts appeared during surgery around the spine, spinal cord, or nerves.      Lower Extremities  Continuous  recording was performed utilizing needle electrodes in the following muscles bilaterally:  Vastus Lateralus (L2, L3, L4), Tibialis Anterior (L4, L5), Peroneus Longus (L5, S1),  Popliteus (L5, S1), Medial Gastrocnemius (S1, S2).          Triggered Electromyography (EMG) pedicle screw stimulation    The integrity of the vertebral bodies with respect to pedicle screw placements were monitored intermittently by direct stimulation of pedicle screws. Stimulation and recording parameters are noted in the technology report.  Compound muscle action potentials were recorded bilaterally from the muscles as noted in the section, Free Running EMG.     After placement of the pedicle screws, the monopolar stimulator probe was applied to the hexagonal port of the screw shank.  All efforts are made to keep the surgical field as dry as possible to avoid significant current shunting.  Before testing the pedicle screws, EMG was recorded to confirm integrity of the stimulating-recording equipment.  Pedicle screw stimulation started at 0.5 mAmp and was increased in 0.5 mAmp increments until 20 mAmp was reached.  If burst EMG activity was evoked with currents less than 8 mAmp, the surgeon was immediately alerted.  If no burst EMG activity was detected at current levels up to 20 mAmp the surgeon was so informed.  The surgeon was notified of the mAmp threshold that evoked burst EMG activity between 8 and 20 mAmp for all pedicle screws.         Train of Four responses (TOF)    The degree of neuromuscular blockade was monitored intermittently during surgery to confirm the reliability of EMG responses. Compound muscle action potential was recorded from the extensor hallucis longus after a train-of-four stimulation of the left posterior tibial nerve. Stimulation and recording parameters are noted in the technology report.  The anesthesiologist was immediately alerted if of the four amplitudes were absent or there was attenuation of the amplitude from the first to the fourth peaks.                         BASELINE UPPER SSEP      BASELINE LOWER SSEP      BASELINE EMG      BASELINE TOF      CLOSING UPPER  SSEP      CLOSING LOWER SSEP      CLOSING EMG      TOF                  Craig Foley  09/17/2014

## 2014-09-17 NOTE — Unmapped (Signed)
Anesthesia Transfer of Care Note    Patient: Craig Foley  Procedure(s) Performed: Procedure(s):  LUMBAR 4-5 DECOMPRESSION WITH FUSION AND FIXATION AND RIGHT SYNOVIAL CYST RESECTION USING MINIMALLY INVASIVE TECHNIQUES    Patient location: PACU    Post pain: Adequate analgesia    Post assessment: no apparent anesthetic complications, tolerated procedure well and no evidence of recall    Post vital signs:    Filed Vitals:    09/17/14 1825   BP: 121/78   Pulse: 77   Temp: 97.5   Resp: 23   SpO2: 98%       Level of consciousness: awake, alert  and oriented    Complications: None

## 2014-09-17 NOTE — Unmapped (Signed)
Anesthesia Post Note    Patient: Craig Foley    Procedure(s) Performed: Procedure(s):  LUMBAR 4-5 DECOMPRESSION WITH FUSION AND FIXATION AND RIGHT SYNOVIAL CYST RESECTION USING MINIMALLY INVASIVE TECHNIQUES    Anesthesia type: general endotracheal    Patient location: PACU    Post pain: Adequate analgesia    Post assessment: no apparent anesthetic complications and tolerated procedure well    Last Vitals:   Filed Vitals:    09/17/14 1915   BP: 135/65   Pulse: 74   Temp:    Resp: 18   SpO2: 100%       Post vital signs: stable    Level of consciousness: awake    Complications: None

## 2014-09-17 NOTE — Unmapped (Signed)
[place]  DEPARTMENT OF ANESTHESIOLOGY  PRE-PROCEDURAL EVALUATION    Craig Foley is a 80 y.o. year old male presenting for:    Procedure(s):  LUMBAR 4-5 DECOMPRESSION WITH FUSION AND FIXATION AND RIGHT SYNOVIAL CYST RESECTION USING MINIMALLY INVASIVE TECHNIQUES    Surgeon:   Lonny Prude, MD    Chief Complaint     <principal problem not specified>    Review of Systems     Anesthesia Evaluation    Patient summary reviewed.  All other systems reviewed and are negative.     No history of anesthetic complications   I have reviewed the History and Physical Exam, any relevant changes are noted in the anesthesia pre-operative evaluation.      Cardiovascular:    Exercise tolerance: good  Duke Met score: 4 - Raking leaves. Weeding or pushing a power mower.  (+) CAD, CABG/stent (CABG 2003) and dysrhythmias (hx atrial flutter s/p successful ablation).  Hypertension is well controlled.    (-) angina, CHF.  ROS comment: Echo 2013  CONCLUSIONS 1.  Normal left ventricular function.2.  Biatrial enlargement, moderate left atrial enlargement.3.  Questionable right ventricular enlargement.4.  No valve disease. 5.  Patient is in atrial flutter during this study.    Negative stress test 2013    Neuro/Muscoloskeletal/Psych:    (+) back problem.    (-) seizures, CVA.     Pulmonary:      (-) shortness of breath.       GI/Hepatic/Renal:    GERD is well controlled.    (-) liver disease, renal disease.    Endo/Other:        (-) diabetes mellitus.       Past Medical History     Past Medical History   Diagnosis Date   ??? Coronary artery disease    ??? Hypertension    ??? HLD (hyperlipidemia)    ??? Atrial flutter    ??? GERD (gastroesophageal reflux disease)    ??? OSA on CPAP    ??? Cancer      skin- BCC on (L) ear       Past Surgical History     Past Surgical History   Procedure Laterality Date   ??? Coronary artery bypass graft  2003     x4   ??? Cardiac electrophysiology study and ablation  2013     atrial flutter   ??? Esophageal dilation  2010   ??? Eye  surgery Bilateral      cataracts   ??? Endoscopic release transverse carpal ligament of hand Bilateral    ??? Vein ligation and stripping     ??? Skin cancer excision         Family History     Family History   Problem Relation Age of Onset   ??? Cancer Mother      breast   ??? Heart failure Father    ??? Diabetes Other        Social History     History     Social History   ??? Marital Status: Married     Spouse Name: N/A     Number of Children: N/A   ??? Years of Education: N/A     Occupational History   ??? Not on file.     Social History Main Topics   ??? Smoking status: Former Smoker -- 1.00 packs/day for 20 years     Quit date: 11/30/1973   ??? Smokeless tobacco: Never Used   ???  Alcohol Use: 1.2 oz/week     2 Glasses of wine per week   ??? Drug Use: No   ??? Sexual Activity: Not on file     Other Topics Concern   ??? Not on file     Social History Narrative   ??? No narrative on file       Medications     Allergies:  No Known Allergies    Home Meds:  Prior to Admission medications as of 09/17/14 1136   Medication Sig Taking?   atenolol (TENORMIN) 25 MG tablet Take 25 mg by mouth daily. Yes   atorvastatin (LIPITOR) 20 MG tablet Take 20 mg by mouth daily. Yes   lisinopril (PRINIVIL,ZESTRIL) 2.5 MG tablet Take 2.5 mg by mouth daily. Yes   LORazepam (ATIVAN) 1 MG tablet Take 1 mg by mouth 3 times a day as needed for Anxiety. Yes   omeprazole (PRILOSEC) 20 MG capsule Take 20 mg by mouth every morning before breakfast. Yes   aspirin 81 MG EC tablet Take 81 mg by mouth daily.    coenzyme Q10 (CO Q-10) 10 mg capsule Take 10 mg by mouth daily.    fish oil-omega-3 fatty acids 300-1,000 mg capsule Take 1 g by mouth daily.    meloxicam (MOBIC) 15 MG tablet Take 15 mg by mouth daily.        Inpatient Meds:  Scheduled:   ??? bacitracin       ??? floseal hemostatic sealant       ??? gelatin adsorbable       ??? thrombin (bovine)       ??? vancomycin         Continuous:   ??? lactated ringers 20 mL/hr (09/17/14 1156)   ??? sodium chloride         PRN: ceFAZolin (ANCEF)  IVPB    Vital Signs     Wt Readings from Last 3 Encounters:   09/17/14 225 lb 4.8 oz (102.195 kg)   09/17/14 225 lb 4.8 oz (102.195 kg)   08/31/14 233 lb (105.688 kg)     Ht Readings from Last 3 Encounters:   09/17/14 5' 10 (1.778 m)   09/17/14 5' 10 (1.778 m)   08/31/14 5' 10 (1.778 m)     Temp Readings from Last 3 Encounters:   09/17/14 98.4 ??F (36.9 ??C) Temporal   09/17/14 98.4 ??F (36.9 ??C) Temporal   08/31/14 97.7 ??F (36.5 ??C) Oral     BP Readings from Last 3 Encounters:   09/17/14 128/52   09/17/14 128/52   08/31/14 150/68     Pulse Readings from Last 3 Encounters:   09/17/14 51   09/17/14 51   08/31/14 54     SpO2 Readings from Last 3 Encounters:   09/17/14 99%   09/17/14 99%   08/31/14 100%       Physical Exam     Airway:     Mallampati: III  Mouth Opening: >2 FB  TM distance: > = 3 FB  Neck ROM: full    Dental:   - normal exam     Pulmonary:        Cardiovascular:       Neuro/Musculoskeletal/Psych:    Mental status: alert and oriented to person, place and time.          Abdominal:       Current OB Status:       Other Findings:        Laboratory Data  Lab Results   Component Value Date    WBC 6.7 08/31/2014    HGB 15.1 08/31/2014    HCT 45.2 08/31/2014    MCV 100.9* 08/31/2014    PLT 180 08/31/2014       No results found for this basename: ABORH       Lab Results   Component Value Date    GLUCOSE 98 08/31/2014    BUN 22 08/31/2014    CO2 28 08/31/2014    CREATININE 1.14 08/31/2014    K 4.0 08/31/2014    NA 139 08/31/2014    CL 105 08/31/2014    CALCIUM 9.5 08/31/2014       Lab Results   Component Value Date    INR 1.0 08/31/2014       No results found for this basename: PREGTESTUR, PREGSERUM, HCG, HCGQUANT       Anesthesia Plan     ASA 3     Anesthesia Type:  general endotracheal.     Intravenous induction.    Anesthetic plan and risks discussed with patient and spouse.    Plan, alternatives, and risks of anesthesia, including death, have been explained to and discussed with the patient/legal guardian.  By my  assessment, the patient/legal guardian understands and agrees.  Scenario presented in detail.  Questions answered.    Use of blood products discussed with patient and spouse whom consented to blood products.   Plan discussed with CRNA.

## 2014-09-17 NOTE — Unmapped (Signed)
Problem: Inadequate Airway Clearance  Goal: Patient will maintain patent airway  Assess and monitor breath sounds, cough and sputum (if present), and intake/output. Collaborate with respiratory therapy to administer medications and treatments.   Incentive Spirometry ordered to assist in lung expansion and improve oxygenation.

## 2014-09-17 NOTE — Unmapped (Signed)
Faxed brace order to [facility]. Left message for call back

## 2014-09-17 NOTE — Unmapped (Signed)
Ancef 2gm IVPB available at bedside for anesthesia.

## 2014-09-17 NOTE — Unmapped (Signed)
LUMBAR 4-5 DECOMPRESSION WITH FUSION AND FIXATION AND RIGHT SYNOVIAL CYST RESECTION USING MINIMALLY INVASIVE TECHNIQUES  Procedure Note    Craig Foley  09/17/2014      Pre-op Diagnosis: SPINAL STENOSIS OF LUMBAR REGION WITHOUT NEUROGENIC CLAUDICATION       Post-op Diagnosis: same as above, intraoperative CSF leak repaired    Procedure(s):  LUMBAR 4-5 DECOMPRESSION WITH FUSION AND FIXATION AND RIGHT SYNOVIAL CYST RESECTION USING MINIMALLY INVASIVE TECHNIQUES, Cerebrospinal fluid leak repair      Surgeon(s):  Lonny Prude, MD    Anesthesia: General    Staff:   Circulator: Rae Roam, RN  Radiology Technologist: Malena Catholic, RT  Relief Circulator: Ihor Dow, RN  Relief Scrub: Leonette Monarch, ST  Assistant: Dwain Sarna, CSA  Float: Renaldo Harrison, CSA  Resident: Rance Muir, MD  Preop Nurse: Jones Broom, RN    Estimated Blood Loss: less than 100 mL                 Specimens:   ID Type Source Tests Collected by Time Destination   A : Synovial cyst Tissue Tissue SURGICAL PATHOLOGY EXAM Lonny Prude, MD 09/17/2014  4:12 PM               Drains:             There were no complications unless listed below. Intraoperative CSF leak, primarily repaired        [name]     Date: 09/17/2014  Time: 6:09 PM

## 2014-09-17 NOTE — Unmapped (Signed)
Recovery care complete. No distress. Resting comfortably. 0 score obtained via OPAS scale. Tolerating ice chips. Lying flat per order. Sign out received from Dr. Doreene Burke. Report called to Olegario Messier, [place]

## 2014-09-17 NOTE — Unmapped (Signed)
Call back from technician on call from brace shop. Will apply brace in the am.

## 2014-09-18 ENCOUNTER — Inpatient Hospital Stay: Admit: 2014-09-18 | Payer: MEDICARE

## 2014-09-18 LAB — BASIC METABOLIC PANEL
Anion Gap: 6 mmol/L (ref 3–16)
BUN: 19 mg/dL (ref 7–25)
CO2: 25 mmol/L (ref 21–33)
Calcium: 8.6 mg/dL (ref 8.6–10.3)
Chloride: 105 mmol/L (ref 98–110)
Creatinine: 1.07 mg/dL (ref 0.60–1.30)
GFR MDRD Af Amer: 81 See note.
GFR MDRD Non Af Amer: 67 See note.
Glucose: 146 mg/dL (ref 70–100)
Osmolality, Calculated: 287 mOsm/kg (ref 278–305)
Potassium: 4 mmol/L (ref 3.5–5.3)
Sodium: 136 mmol/L (ref 133–146)

## 2014-09-18 LAB — HEMOGLOBIN AND HEMATOCRIT, BLOOD
Hematocrit: 40.4 % (ref 38.5–50.0)
Hemoglobin: 13.6 g/dL (ref 13.2–17.1)
MCH: 34.1 pg (ref 27.0–33.0)
MCHC: 33.8 g/dL (ref 32.0–36.0)
MCV: 101.1 fL (ref 80.0–100.0)
RDW: 12.5 % (ref 11.0–15.0)

## 2014-09-18 MED ORDER — sodium chloride 0.9 % 500 mL bolus
Freq: Once | INTRAVENOUS | Status: AC
Start: 2014-09-18 — End: 2014-09-18
  Administered 2014-09-18: 15:00:00 via INTRAVENOUS

## 2014-09-18 MED FILL — HEPARIN (PORCINE) 5,000 UNIT/ML INJECTION SOLUTION: 5000 5,000 unit/mL | INTRAMUSCULAR | Qty: 1

## 2014-09-18 MED FILL — ROBAXIN 100 MG/ML INJECTION SOLUTION: 100 100 mg/mL | INTRAMUSCULAR | Qty: 10

## 2014-09-18 MED FILL — HYDROMORPHONE 0.5 MG/0.5 ML INJECTION SYRINGE: 0.5 0.5 mg/0.5 mL | INTRAMUSCULAR | Qty: 0.5

## 2014-09-18 MED FILL — SODIUM CHLORIDE 0.9 % INTRAVENOUS SOLUTION: INTRAVENOUS | Qty: 500

## 2014-09-18 MED FILL — CEFAZOLIN 2 GRAM/50 ML IN DEXTROSE (ISO-OSMOTIC) INTRAVENOUS PIGGYBACK: 2 2 gram/50 mL | INTRAVENOUS | Qty: 50

## 2014-09-18 MED FILL — OXYCODONE-ACETAMINOPHEN 5 MG-325 MG TABLET: 5-325 5-325 mg | ORAL | Qty: 2

## 2014-09-18 MED FILL — ONDANSETRON HCL (PF) 4 MG/2 ML INJECTION SOLUTION: 4 4 mg/2 mL | INTRAMUSCULAR | Qty: 2

## 2014-09-18 MED FILL — LISINOPRIL 2.5 MG TABLET: 2.5 2.5 MG | ORAL | Qty: 1

## 2014-09-18 MED FILL — HYDROMORPHONE 1 MG/ML INJECTION SYRINGE: 1 1 mg/mL | INTRAMUSCULAR | Qty: 1

## 2014-09-18 MED FILL — DOCUSATE SODIUM 100 MG CAPSULE: 100 100 MG | ORAL | Qty: 1

## 2014-09-18 MED FILL — SENNA 8.6 MG TABLET: 8.6 8.6 mg | ORAL | Qty: 1

## 2014-09-18 MED FILL — ATORVASTATIN 20 MG TABLET: 20 20 MG | ORAL | Qty: 1

## 2014-09-18 MED FILL — POTASSIUM CHLORIDE 20 MEQ/L IN 0.9 % SODIUM CHLORIDE INTRAVENOUS: 20 20 mEq/L | INTRAVENOUS | Qty: 1000

## 2014-09-18 MED FILL — PANTOPRAZOLE 40 MG TABLET,DELAYED RELEASE: 40 40 MG | ORAL | Qty: 1

## 2014-09-18 MED FILL — ATENOLOL 25 MG TABLET: 25 25 MG | ORAL | Qty: 1

## 2014-09-18 NOTE — Unmapped (Addendum)
D: patient ambulated in halls x 3 today with gait belt and walker. No sx of acute distress. Fall precautions in place. Patient denies nausea with norco. No sx of acute distress. BP better. Patient denies dizziness with evening ambulation.

## 2014-09-18 NOTE — Unmapped (Signed)
Neurosurgery Progress Note    S: No acute events overnight. Back pain, leg pain is gone as far as he can tell.     O:  Temp:  [97.4 ??F (36.3 ??C)-98.4 ??F (36.9 ??C)] 98.2 ??F (36.8 ??C)  Heart Rate:  [48-86] 86  Resp:  [11-23] 16  BP: (103-155)/(51-83) 137/67 mmHg  FiO2:  [42 %-97 %] 94 %    NAD  A&O x 4, FCC  CN grossly intact  MAE well, 5/5 intact  Wound c/d/i    Labs/Imaging:  Heme  Recent Labs      09/18/14   0646   HGB  13.6   HCT  40.4       Chem  No results found for this basename: NA, K, CL, CO2, BUN, CREATININE, GLUCOSE, CALCIUM, PHOS, MG,  in the last 72 hours    Coag  No results found for this basename: PROTIME, INR, APTT,  in the last 72 hours    CT Lumbar spine wo contrast: official read pending, expected postoperative changes with good placement of screws and rods and interbody graft    A/P: POD#1 s/p L4-5 decompression/fixation/fusion with minimally invasive techniques    - Neurologically stable, pain controlled  - Pain control with PO medications today  - Okay to be upright today, okay to mobilize once brace arraives  - CT lumbar spine with expected postoperative changes  - Upright xrays pending in brace  - PT/OT/mobilize  - Advance diet as tolerated  - Bowel regimen  - DVT ppx with SCDS and SQH  - Dispo: pending mobility and pain control, likely d/c tomorrow    Rance Muir, MD  Neurosurgery Resident  [phone]  09/18/2014  7:06 AM

## 2014-09-18 NOTE — Unmapped (Signed)
---   CASE MANAGEMENT NOTE --  Met with patient to discuss discharge planning. Introduced self and role of Sports coach, provided contact information.    Type of Home: one level  Number of Entry Steps: 2 STE 0 HR  Patient Lives With: spouse  24/7 Supervision/Assistance Available at D/C if needed: spouse available at d/c  Level of Activity Prior to Admission: independent without an assistive device  DME Available at Home: RW, [facility]  Home Health Prior to Admission: no, agreeable to [facility] at d/c       Name of Home Health Company: [facility]       Phone: [phone]       Fax: [phone]  Transportation: spouse    Met with patient and spouse, discussed therapy recommendations, both agree with recommendations. Wife states she is available to provide 24/7 supervision/assistance at discharge. HHC choices offered, patient would like referral made to [facility] for SN/PT/OT. Diane at [facility] aware of referral and potential discharge tomorrow. No other needs identified. Patient/spouse understand and agree with discharge plan.    --- DISCHARGE PLAN SUMMARY ---  Home with [facility].  Thurmond Butts, RN, Case Manager, [phone]  CM will continue to follow and remain available for any changing discharge needs.    Patient/Family aware and taking part in the discharge plan.  Patient/family were offered a post-acute provider list as applicable to the discharge plan and insurance provider.  Patient/family were given the freedom to choose providers and financial interest(s) were disclosed as appropriate.

## 2014-09-18 NOTE — Unmapped (Signed)
Physical Therapy  Physical Therapy Initial Assessment and treatment     Name: Craig Foley  DOB: October 06, 1934  Attending Physician: Lonny Prude, MD  Admission Diagnosis: Spondylolisthesis of lumbar region [M43.16]  Date: 09/18/2014  Precautions: falls, foley, LSO when OOB donned while EOB, back precautions  Reviewed Pertinent hospital course: Yes  Hospital Course PT/OT: Pt is a 80 y/o male s/p lumbar 4-5 decompression with fusion and fixation and with R synovial cyst resection  Assessment  Assessment: Impaired Bed Mobility;Impaired Transfer Mobility;Impaired Ambulation;Impaired Balance;Impaired Strength  Prognosis: Good  Goals  Pt Will Go Supine To Sit: Modified Independent (with HOB slightly elevated without use of side rails via log roll technique)  Pt Will Ambulate: Modified Independent (142ft with RW)  Pt Will Go Up / Down Stairs: Contact Guard (2 steps with folded RW without use of handrails)  Pt Will Stand: Contact Guard (from various surfaces to RW while maintaining back precautions)  Miscellaneous Goal #1: Pt will perform HEP for increased activity tolerance LE strength independently  Time frame for goals to be met in: 3 days (09/21/14)   Recommendation  Plan  Treatment/Interventions: LE strengthening/ROM;Endurance training;Patient/family training;Gait Conservation officer, nature;Therapeutic Activity;Therapeutic Exercise  PT Frequency: 3-5x/wk    Recommendation  Recommendation: Home PT;Home with 24 hour supervision/assistance  Equipment Recommended: Patient has needed mobility DME  Problem List  Patient Active Problem List   Diagnosis   ??? Spondylolisthesis of lumbar region      Past Medical History  Past Medical History   Diagnosis Date   ??? Coronary artery disease    ??? Hypertension    ??? HLD (hyperlipidemia)    ??? Atrial flutter    ??? GERD (gastroesophageal reflux disease)    ??? OSA on CPAP    ??? Cancer      skin- BCC on (L) ear      Past Surgical History  Past Surgical History   Procedure  Laterality Date   ??? Coronary artery bypass graft  2003     x4   ??? Cardiac electrophysiology study and ablation  2013     atrial flutter   ??? Esophageal dilation  2010   ??? Eye surgery Bilateral      cataracts   ??? Endoscopic release transverse carpal ligament of hand Bilateral    ??? Vein ligation and stripping     ??? Skin cancer excision       Patient Stated Goals  Goal #1: to return home   Home Living/Prior Function  Type of Home: House  Home Layout: One level;Performs ADL's on one level;Able to live on main level with bedroom/bathroom (2 STE 0 HR 1 step down into bedroom)  Bathroom Shower/Tub: Pension scheme manager: Raised  Bathroom Equipment: Buyer, retail: Agricultural consultant  Level of Independence: Independent  Lives With: Spouse (24/7 assist available upon d/c)  ADL Assistance: Independent  Homemaking/IADL Assistance: Independent  Vocational: Retired Scientist, research (physical sciences))     Pain  Pain Score:   9  Pain Location: Back  Pain Descriptors: Aching  Pain Intervention(s): Medication (See eMAR);Ambulation/increased activity  Therapist reported pain to:: RN aware and notifying    Vision  Current Vision: Wears glasses only for reading    Cognition  Orientation Level: Oriented X4    Sensation  Light Touch: No apparent deficits  Sharp/Dull: No apparent deficits  Proprioception: No apparent deficits  Inattention/Neglect: Appears intact  Initiation: Appears intact  Motor Planning: Appears intact  Perseveration: Not present  Lower Extremity  RLE Assessment  RLE Assessment: Impaired (at least 3/5 based on functional movement screen)  LLE Assessment  LLE Assessment: Impaired (at least 3/5 based on functional movement screen)  Functional Mobility  Bed Mobility Eval  Rolling: Minimal  Supine to Sit: Contact Guard (HOB slightly elevated no use of siderails via log roll technique)  Sit to Supine: Contact Guard (HOB slightly elevated no use of siderails via log roll technique)  Transfers Eval  Sit to Stand: Contact Guard  (from bed level to RW with cues for hand palcement and adherence to back precautions)  Bed to Chair: Contact Guard  Gait Eval  Pattern Eval :  (step through with RW, narrow BOS, average cadence, equal step length)  Gait Assistance Eval: Engineer, manufacturing systems Device Eval: Rolling walker  Distance Eval: 37ft  Curb eval: Contact Guard (ascend/descend curb step with cues to adhere to back precautions)  Balance Eval  Sitting - Static: Supervision  Sitting-Dynamic: Supervision   Standing-Static: Advertising account executive Assistance  Standing-DynamicResearch officer, trade union Assistance  Treatment: Provided 8 minutes of Physical Therapy treatment in addition to the above Evaluation/Assessment.  Treatment provided includes the following:    Patient Education: back precautions, activity orders per MD, positional changes every 30-45 minutes. Pt/family verbalized understanding.    Gait Training: Patient ambulated 187ft with RW CGA demonstrating equal step length, narrow BOS, average cadence, step through pattern requiring verbal and tactile cues for maintaining upright posture.     Patient Education  Role of PT, POC and goals.  Position After Physical Therapy session:  Patient upright in bedside chair.  Call light and phone / communication device placed within patient's reach.  Chair pad in place without chair alarm box. Nursing notified.  Time  Start Time: 1340  Stop Time: 1414  Time Calculation (min): 34 min    Charges   $Initial PT Evaluation: 1 Procedure  $Gait/Mobility: 8-22 mins    Brantley Stage, Student Physical Therapist  09/18/2014           I attest that I was present during the above treatment. I directed the service and made a skilled judgement and assessment. The above documentation completed by the student has been reviewed and was addended as appropriate.     Zyiah Withington L. Ann [place], [place] [address]  Pager [phone]

## 2014-09-18 NOTE — Unmapped (Signed)
Occupational Therapy  Occupational Therapy Initial Assessment/Treatment     Name: Craig Foley  DOB: [birth year]  Attending Physician: Lonny Prude, MD  Admission Diagnosis: Spondylolisthesis of lumbar region [M43.16]  Date: 09/18/2014  Precautions: Spinal, LSO OOB, fall risk  Reviewed Pertinent hospital course: Yes  Hospital Course PT/OT: Pt is a 80 y/o male s/p lumbar 4-5 decompression with fusion and fixation and with R synovial cyst resection    Assessment  Assessment: Decreased ADL status;Decreased Functional Mobility;Decreased activity tolerance  Prognosis: Good  Goal Formulation: Patient  Pt very pleasant, cooperative, with c/o dizziness and nausea.  (Dr orders were for pt to lie flat in bed until 0800).  RN said OK to get pt up OOB.  Spinal Precaution Handout reviewed and Initial OT Evaluation completed this date.  Cont POC thru dc.  Pt would be safe to return home with wife assisting.                   BP      Supine            93/51  Sitting(EOB)  112/48  Sitting (chair)   92/50        Goals  Pt Will demonstrate supine to sit to prep for ADLs: Contact Guard  Pt Will demonstrate functional chair transfer: Supervision  Pt Will demonstrate toilet transfer: Supervision  Pt Will demonstrate grooming task: Supervision  Pt Will demonstrate LE ADLs: Supervision (per AE)  Miscellaneous Goal #1: Pt will demonstrate understanding of spinal precautions.  Time frame for goals to be met in: 7 days, 09-25-14    Recommendation  Plan  Treatment Interventions: ADL retraining;Energy Conservation;Neuro muscular reeducation;Patient/Family training;Functional transfer training;Therapeutic Activity  OT Frequency: 5-10x/wk  Recommendation  Recommendation: Home with 24 hour supervision/assistance  Equipment Recommended: Patient has needed bathroom DME     Problem List  Patient Active Problem List   Diagnosis   ??? Spondylolisthesis of lumbar region        Past Medical History  Past Medical History   Diagnosis Date   ??? Coronary  artery disease    ??? Hypertension    ??? HLD (hyperlipidemia)    ??? Atrial flutter    ??? GERD (gastroesophageal reflux disease)    ??? OSA on CPAP    ??? Cancer      skin- BCC on (L) ear        Past Surgical History  Past Surgical History   Procedure Laterality Date   ??? Coronary artery bypass graft  2003     x4   ??? Cardiac electrophysiology study and ablation  2013     atrial flutter   ??? Esophageal dilation  2010   ??? Eye surgery Bilateral      cataracts   ??? Endoscopic release transverse carpal ligament of hand Bilateral    ??? Vein ligation and stripping     ??? Skin cancer excision          Patient Stated Goals  Goal #1: walk, do everything      Home Living/Prior Function  Type of Home: House  Home Layout: One level;Performs ADL's on one level (one step down into bedroom)  Bathroom Shower/Tub: Pension scheme manager: Raised  Bathroom Equipment: Buyer, retail: Agricultural consultant  Prior Function  Level of Independence: Independent  Lives With: Spouse  ADL Assistance: Independent  Homemaking/IADL Assistance: Independent  Vocational: Retired     Pain  Pain Score:   8  Pain Location: Back  Pain Descriptors: Aching  Pain Intervention(s): Medication (See eMAR);Ambulation/increased activity  Therapist reported pain to:: RN aware and notifying     Vision  Current Vision: Wears glasses only for reading    Cognition  Overall Cognitive Status: Within Functional Limits  Orientation Level: Oriented X4    Sensation  Light Touch: No apparent deficits    Proprioception  Proprioception  Proprioception: No apparent deficits    Perception  Perception  Inattention/Neglect: Appears intact  Initiation: Appears intact  Motor Planning: Appears intact  Perseveration: Not present    Right Upper Extremity   RUE Assessment: Within Functional Limits         Left Upper Extremity  LUE Assessment: Within Functional Limits         Hand Function  Gross Grasp: Functional  Coordination: Functional        Functional Mobility  Bed Mobility  Eval  Rolling: Minimal  Supine to Sit: Minimal  Transfers Eval  Sit to Stand: Minimal  Bed to Chair: Contact Guard  Balance Eval  Sitting - Static: Supervision  Sitting-Dynamic: Contact Guard Assistance   Standing-Static: Advertising account executive Assistance  Standing-Dynamic: Minimal Assistance    ADL  Where Assessed: Chair  Eating Assistance: Independent  LE Dressing Assistance: Maximal  LE Dressing Deficit: Don/doff R sock    Treatment: Provided 40 minutes of Occupational Therapy treatment in addition to the above Evaluation/Assessment.  Treatment provided includes the following:    -Educated pt role of OT, spinal precautions and spinal precautions handout issued.  -Pt education re: how to purchase AE from [facility].    Mobility/Transfers  -Educated on log roll technique for bed mobility.  -Educated pt on brace application and wearing schedule.   -Educated pt on functional transfer techniques to maintain spinal precautions for bed, chair, car, toilet transfers.  -Educated pt on length of time for sitting tolerance and position changes.    ADLs  -Educated pt on UE dressing and brace application techniques.  -Educated pt on LE dressing techniques to maintain precautions. Recommend the  following AE: sock aid, reacher  -Educated pt on bathing/showering techniques/safety,  and tub/shower transfers to maintain precautions.   Recommend the following DME/AE: LH bath sponge  -Educated pt on toilet transfers and compensatory strategies for peri care.   -Educated pt on maintaining spinal precautions while performing grooming.      Home Modification  -Educated pt on item retrieval techniques and recommended reacher for home use.   -Educated pt on ways to modify home and arrange needed items in order to avoid bending/lifting/twisting and to maintain precautions.   -Educated pt on ways to modify home tasks and workstations (in sitting or standing) in order to maintain spinal precautions.     Pt demonstrates understanding of above  education.      Patient in chair, SCD's on, wife present after treatment.  Call light,phone and needs in reach.           Time  Start Time: 0905  Stop Time: 1006  Time Calculation (min): 61 min    Charges  $OT Evaluation: 1 Procedure    $Therapeutic Activity: 38-52 mins       Thayer Headings, OTR/L  09/18/2014

## 2014-09-18 NOTE — Unmapped (Signed)
Problem: Discharge Planning  Goal: Identify discharge needs  Outcome: Completed Date Met:  09/18/14  Home with [facility].

## 2014-09-18 NOTE — Unmapped (Signed)
Patient received from PACU to 430. A/o X3, calm and cooperative, mild drowsy but awakens easily. Patient c/o nausea, did have a small emesis after sipping water. Medicated for n/v per MAR. Pt to be flat till AM per MD order, patient and wife states an understanding. Medicated for pain per MAR. Pt reported need to void, attempted to position in bed to utilize urinal, after multiple attempts patient was unable to void. Reports having issues prior to surgery have issues urinating at home. Foley cath placed d/t bedrest order, inability to void, distention, and pain reported by patient; immediate 800+mL clear/yellow urine received, pt stated significant relief. Will continue to monitor.    Clovis Pu, RN

## 2014-09-19 MED ORDER — bisacodyl (DULCOLAX) 5 mg EC tablet
5 | ORAL_TABLET | Freq: Every day | ORAL | Status: AC | PRN
Start: 2014-09-19 — End: ?

## 2014-09-19 MED ORDER — senna (SENOKOT) 8.6 mg tablet
8.6 | ORAL_TABLET | Freq: Two times a day (BID) | ORAL | Status: AC
Start: 2014-09-19 — End: ?

## 2014-09-19 MED ORDER — methocarbamol (ROBAXIN) 500 MG tablet
500 | Freq: Four times a day (QID) | ORAL | Status: AC | PRN
Start: 2014-09-19 — End: ?

## 2014-09-19 MED ORDER — oxyCODONE-acetaminophen (PERCOCET) 5-325 mg per tablet
5-325 | ORAL_TABLET | ORAL | Status: AC | PRN
Start: 2014-09-19 — End: ?

## 2014-09-19 MED FILL — HYDROMORPHONE 1 MG/ML INJECTION SYRINGE: 1 1 mg/mL | INTRAMUSCULAR | Qty: 1

## 2014-09-19 MED FILL — PANTOPRAZOLE 40 MG TABLET,DELAYED RELEASE: 40 40 MG | ORAL | Qty: 1

## 2014-09-19 MED FILL — ATENOLOL 25 MG TABLET: 25 25 MG | ORAL | Qty: 1

## 2014-09-19 MED FILL — OXYCODONE-ACETAMINOPHEN 5 MG-325 MG TABLET: 5-325 5-325 mg | ORAL | Qty: 2

## 2014-09-19 MED FILL — SENNA 8.6 MG TABLET: 8.6 8.6 mg | ORAL | Qty: 1

## 2014-09-19 MED FILL — HEPARIN (PORCINE) 5,000 UNIT/ML INJECTION SOLUTION: 5000 5,000 unit/mL | INTRAMUSCULAR | Qty: 1

## 2014-09-19 MED FILL — DOCUSATE SODIUM 100 MG CAPSULE: 100 100 MG | ORAL | Qty: 1

## 2014-09-19 MED FILL — LISINOPRIL 2.5 MG TABLET: 2.5 2.5 MG | ORAL | Qty: 1

## 2014-09-19 NOTE — Unmapped (Signed)
REFERRAL FOR HOME HEALTH SERVICES FORM     Patient name: Craig Foley  Patient MRN: [id number]  DOB: [birth year]  Age: 80 y.o.  Gender: male  SSN: [id number]  Address: [address] [place], [place] [address]     Phone number: [phone] (home)    Patient emergency contact: Extended Emergency Contact Information  Primary Emergency Contact: Brekke,Veronica  Address: [address]           [place], [place] [address] [place]  Home Phone: [phone]  Relation: Spouse    Date of admission: 09/17/2014  Date of discharge: 09/19/2014  Attending provider: Lonny Prude, MD  Primary care physician: Non Staff Referal Provider Gen    Code status: Full Code  Allergies: No Known Allergies    Insurance Information     Insurance Information               MEDICARE/MEDICARE A AND B Phone:     Subscriber: [name], [name] Subscriber#: [id number]    Group#:  Precert#:         GENERIC COMMERCIAL/GENERIC COMMERCIAL Phone: [phone]    Subscriber: [name], [name] Subscriber#: [id number]    Group#: [id number] Precert#:           Diagnoses Present on Admission   Primary Diagnosis: <principal problem not specified>  Discharge Diagnosis :   Active Hospital Problems    Diagnosis Date Noted   ??? Spondylolisthesis of lumbar region [738.4] 09/17/2014      Resolved Hospital Problems    Diagnosis Date Noted Date Resolved   No resolved problems to display.     Prognosis: excellent  Rehabilitation potential: excellent    Diet     Diet Orders         Diet regular starting at 10/19 2048           As listed above    Services Required   Physical Therapy: Plan  Treatment/Interventions: LE strengthening/ROM;Endurance training;Patient/family training;Gait Conservation officer, nature;Therapeutic Activity;Therapeutic Exercise  PT Frequency: 3-5x/wk  Recommendation  Recommendation: Home PT;Home with 24 hour supervision/assistance  Equipment Recommended: Patient has needed mobility DME    PT/OT eval and treat          Weight bearing status: full      Needs 24 hour supervision due to cognitive impairment: No    Discharge Medications   Medications:  Current Discharge Medication List      START taking these medications    Details   bisacodyl (DULCOLAX) 5 mg EC tablet Take 2 tablets (10 mg total) by mouth daily as needed for Constipation.  Qty: 30 tablet, Refills: 0      methocarbamol (ROBAXIN) 500 MG tablet Take 1-2 tablets (500-1,000 mg total) by mouth every 6 hours as needed (muscle spasm).      oxyCODONE-acetaminophen (PERCOCET) 5-325 mg per tablet Take 1-2 tablets by mouth every 4 hours as needed for Pain.  Qty: 30 tablet, Refills: 0      senna (SENOKOT) 8.6 mg tablet Take 1 tablet by mouth 2 times a day.  Qty: 30 tablet, Refills: 0         CONTINUE these medications which have NOT CHANGED    Details   atenolol (TENORMIN) 25 MG tablet Take 25 mg by mouth daily.      atorvastatin (LIPITOR) 20 MG tablet Take 20 mg by mouth daily.      lisinopril (PRINIVIL,ZESTRIL) 2.5 MG tablet Take 2.5 mg by mouth daily.  LORazepam (ATIVAN) 1 MG tablet Take 1 mg by mouth 3 times a day as needed for Anxiety.      omeprazole (PRILOSEC) 20 MG capsule Take 20 mg by mouth every morning before breakfast.      coenzyme Q10 (CO Q-10) 10 mg capsule Take 10 mg by mouth daily.         STOP taking these medications       aspirin 81 MG EC tablet Comments:   Reason for Stopping:         fish oil-omega-3 fatty acids 300-1,000 mg capsule Comments:   Reason for Stopping:         meloxicam (MOBIC) 15 MG tablet Comments:   Reason for Stopping:                 Discharge Specific Orders   Discharge specific orders:  -No bending, twisting, or lifting > 5 pounds  -No strenuous activity, nothing more strenuous than walking. Short, frequent walks are encouraged  -Wear brace at all time when out of bed  -wash incision daily. Otherwise Leave incision open to air. May shower. Do not soak incision, no tub baths for at least 14 days  -Do not drive or operate heavy  machinery while on narcotics   -Do not exceed 4,000mg  acetaminophen in a 24 hour period  -No aspirin, advil, ibuprofen or other blood thinners   -Please call the office or return to ED for fever >101.5 or purulent drainage from wound   -May call nurse practitioner with concerns or questions at [phone]    Isolation     Active Isolation    None      Removed Isolation    None          Vitals   Patient Vitals for the past 4 hrs:   BP Temp Temp src Pulse Resp SpO2   09/19/14 0745 134/50 mmHg 98.5 ??F (36.9 ??C) Oral 78 18 94 %       Equipment/Supplies       Physician Certification   Further, I certify that my clinical findings support that this patient is homebound (i.e. absences from home require considerable and taxing effort and are for medical reasons or religious services or infrequently or short duration when for other reasons) due to deconditioning it would be a taxing effort to receive outpatient services.    My signature below is to certify that this patient is under my care and that I, or nurse practitioner, or a physician assistant working with me, had a face-to-face encounter with this is patient on: 09/19/2014     Follow-up Appointments and [facility] Discharge Physician Name   No future appointments.  Lonny Prude, MD  [phone]  [place]. [address] [place] [address]  [phone]    Schedule an appointment as soon as possible for a visit in 2 weeks        Discharging Physician Signature and Credentials   Discharging Physician: Electronically signed by Illene Labrador  09/19/2014, 8:45 AM    Physician to follow up Information   PCP: Non Staff Referal Provider Gen  PCP address: [address] / [place] [address]  PCP phone number: None  PCP fax number: None    If PCP is not following patient, type physician contact information here:           Physician to follow is: Dr. Carlis Abbott and his phone/fax numbers [phone] and fax [phone]    Discharge Planner  and Credentials          Provider/Company Name and Contact Number:          Home Health Company Name: [facility]    Home Health Company Contact Number: [phone]                                    Discharge Planner Name and Telephone Number:      Reminders (do not erase information below until ready to sign the note):  (1) Update the actual date/time of this note so that it appears at the appropriate place in the medical record    (2) Refresh smart links before signing, but after running the discharge navigator  (particularly the medication reconciliation list)

## 2014-09-19 NOTE — Unmapped (Signed)
Neurosurgery Progress Note    S: patient doing well, mobilizing well     O:  Temp:  [98.5 ??F (36.9 ??C)-100.8 ??F (38.2 ??C)] 98.5 ??F (36.9 ??C)  Heart Rate:  [68-84] 78  Resp:  [16-18] 18  BP: (91-134)/(45-58) 134/50 mmHg    NAD  A&O x 4, FCC  CN grossly intact  MAE well, 5/5 intact  Wound c/d/i    Labs/Imaging:  Heme  Recent Labs      09/18/14   0646   HGB  13.6   HCT  40.4       Chem  Recent Labs      09/18/14   0646   NA  136   K  4.0   CL  105   CO2  25   BUN  19   CREATININE  1.07   GLUCOSE  146*   CALCIUM  8.6       Coag  No results found for this basename: PROTIME, INR, APTT,  in the last 72 hours    CT Lumbar spine wo contrast: official read pending, expected postoperative changes with good placement of screws and rods and interbody graft    A/P: POD#2 s/p L4-5 decompression/fixation/fusion with minimally invasive techniques    - Neurologically stable, pain controlled  - Pain control with PO medications today  - Upright xrays stable   - PT/OT/mobilize  - Advance diet as tolerated  - Bowel regimen  - DVT ppx with SCDS and SQH  - Dispo: likely dc home later today with HHC     Illene Labrador NP  Neurosurgery   [phone]  09/19/2014  8:09 AM

## 2014-09-19 NOTE — Unmapped (Signed)
Physical Therapy                                              Physical Therapy Treatment/Discharge    Name: Craig Foley  DOB: [birth year]  Attending Physician: Lonny Prude, MD  Admission Diagnosis: Spondylolisthesis of lumbar region [M43.16]  Date: 09/19/2014  Precautions: falls, LSO when upright, back precautions  Reviewed Pertinent hospital course: Yes  Hospital Course PT/OT: Pt is a 80 y/o male s/p lumbar 4-5 decompression with fusion and fixation and with R synovial cyst resection    Assessment  Assessment: Impaired Bed Mobility;Impaired Transfer Mobility;Impaired Ambulation;Impaired Balance;Impaired Strength  Prognosis: Good    Goals  Pt Will Go Supine To Sit: Modified Independent (with HOB slightly elevated without use of side rails via log roll technique) Goal not met 09/19/2014 (CGA)  Pt Will Ambulate: Modified Independent (153ft with RW) Goal not met 09/19/2014 (CGA-SBA)  Pt Will Go Up / Down Stairs: Contact Guard (2 steps with folded RW without use of handrails) Goal Met 09/19/2014  Pt Will Stand: Contact Guard (from various surfaces to RW while maintaining back precautions) Goal Met 09/19/2014  Miscellaneous Goal #1: Pt will perform HEP for increased activity tolerance LE strength independently Goal Met 09/19/2014  Time frame for goals to be met in: 3 days (09/21/14)    If patient is d/c from hospital prior to next PT session, this note will serve as a discharge summary with goal status as noted above. D/c acute care PT services pending d/c from hospital.    Recommendation  Plan  Treatment/Interventions: LE strengthening/ROM;Endurance training;Patient/family training;Gait Conservation officer, nature;Therapeutic Activity;Therapeutic Exercise  PT Frequency: 3-5x/wk    Recommendation  Recommendation: Home PT;Home with 24 hour supervision/assistance  Equipment Recommended: Patient has needed mobility DME    Problem List  Patient Active Problem List   Diagnosis   ??? Spondylolisthesis  of lumbar region        Past Medical History  Past Medical History   Diagnosis Date   ??? Coronary artery disease    ??? Hypertension    ??? HLD (hyperlipidemia)    ??? Atrial flutter    ??? GERD (gastroesophageal reflux disease)    ??? OSA on CPAP    ??? Cancer      skin- BCC on (L) ear        Past Surgical History  Past Surgical History   Procedure Laterality Date   ??? Coronary artery bypass graft  2003     x4   ??? Cardiac electrophysiology study and ablation  2013     atrial flutter   ??? Esophageal dilation  2010   ??? Eye surgery Bilateral      cataracts   ??? Endoscopic release transverse carpal ligament of hand Bilateral    ??? Vein ligation and stripping     ??? Skin cancer excision     ??? Lumbar fusion N/A 09/17/2014     Procedure: LUMBAR 4-5 DECOMPRESSION WITH FUSION AND FIXATION AND RIGHT SYNOVIAL CYST RESECTION USING MINIMALLY INVASIVE TECHNIQUES;  Surgeon: Lonny Prude, MD;  Location: [facility] OR;  Service: Neurosurgery;  Laterality: N/A;       Cognition:  Overall Cognitive Status: Within Functional Limits  Orientation Level: Oriented X4     Pain:  Pain Score:   6  Pain Location: Back  Pain Descriptors: Aching  Pain Intervention(s): Repositioned;Ambulation/increased activity  Therapist reported pain to:: nurse aware      Mobility:  Bed Mobility  Rolling: Supervision  Supine to Sit: Contact Guard (with HOB flat via log roll technique with vc/tc to adhere to precautions)  Sit to Supine: Contact Guard (with HOB flat via log roll technique with vc/tc to adhere to precautions)  Transfers  Sit to Stand: Supervision (from bed and chair level to RW with cues for hand placement and technique)  Bed to Chair: Supervision  Chair to Bed: Supervision  Gait  Pattern: Within Functional Limits (demonstrating even step length, narrow BOS, no path deviation and no LOB noted)  Gait Assistance: Contact Guard (to SBA)  Assistive Device: Rolling walker  Distance: 189ft  Stair Management Technique: No rails;Step to pattern;With walker (folded walker)  Stair  Management Assistance: Contact Guard  Number of Stairs: 1 (2 trials to mimic home setup)  Balance  Sitting - Static: Good  Sitting - Dynamic: Good  Standing - Static: Fair (with B UE support)  Standing - Dynamic: Fair (with B UE support)    Patient Education  AD management/technique for in/out of home, increased activity with assistance, safe mobility practice, back precautions to include no reaching/pulling, lifting, bending and twisting. Patient/spouse verbalized understanding. Pt educated on proper body mechanics and carryover of back precautions for safe car transfer. Pt verbalized understanding and returned demonstration of simulation.    Pt in bedside chair at end of session with needs in reach.     Time  Start Time: 1146  Stop Time: 1213  Time Calculation (min): 27 min    Charges  $Gait/Mobility: 23-37 mins    Brantley Stage, Student Physical Therapist  09/19/2014        I attest that I was present during the above treatment. I directed the service and made a skilled judgement and assessment. The above documentation completed by the student has been reviewed and was addended as appropriate.     Ericson Nafziger [address] [place], [place] [address]  Pager [phone]

## 2014-09-19 NOTE — Unmapped (Signed)
[place]  Inpatient Discharge Summary     Patient: Craig Foley  Age: 80 y.o.    MRN: [id number]   CSN: [id number]    Date of Admission: 09/17/2014  Date of Discharge: 09/19/2014  Attending Physician: Lonny Prude, MD   Primary Care Physician: Non Staff Referal Provider Gen     Diagnoses Present on Admission     Past Medical History   Diagnosis Date   ??? Coronary artery disease    ??? Hypertension    ??? HLD (hyperlipidemia)    ??? Atrial flutter    ??? GERD (gastroesophageal reflux disease)    ??? OSA on CPAP    ??? Cancer      skin- BCC on (L) ear        Discharge Diagnoses     Active Hospital Problems    Diagnosis Date Noted   ??? Spondylolisthesis of lumbar region [738.4] 09/17/2014      Resolved Hospital Problems    Diagnosis Date Noted Date Resolved   No resolved problems to display.       Operations/Procedures Performed (include dates)     Surgeries:  Surgical/Procedural Cases on this Admission    Case IDs Date Procedure Surgeon Location Status    [id number] 09/17/14 LUMBAR 4-5 DECOMPRESSION WITH FUSION AND FIXATION AND RIGHT SYNOVIAL CYST RESECTION USING MINIMALLY INVASIVE TECHNIQUES Lonny Prude, MD [facility] OR [facility]          Lines and tubes:  Active Line / PIV Line    Name:   Placement date:   Placement time:   Site:   Days:    Peripheral IV 09/17/14 Left Forearm  09/17/14   1158   Forearm   1    Peripheral IV 09/17/14 Right Hand  09/17/14      Hand   2          Other Procedures / Pertinent Imaging:  CT LUMBAR SPINE WO CONTRAST performed on 09/17/2014 9:15 PM   Indications:Status post lumbar fusion.   Comparisons: None   Technique: Multidetector CT images were obtained of the lumbar spine without contrast.   Findings:   There is transitional lumbosacral anatomy, which for the purposes of counting will be considered a partially sacralized L5 with ankylosis involving the right transverse process.   There are postsurgical changes from a right-sided laminectomy at L4 and right L4-L5 facetectomy. This is stabilized by bilateral  transpedicular screws and vertical rods as well as interbody L4-L5 spacer. There is no CT evidence of orthopedic hardware complication. Placement of bone grafting material is seen on the left adjacent to the screw heads.   The vertebral body alignment is maintained. No acute fractures are evident. There are the expected postsurgical changes with fluid and gas in the laminectomy defect and epidural gas within the central canal.   At L1-L2, L2-L3, and L3-L4: There is mild disc bulging but no significant central canal or neuroforaminal stenosis.   At L4-L5: There is been decompression of the central canal and right neuroforamina. There is severe left-sided facet disease and mild left L4 neuroforaminal stenosis.   At L5-S1: There is no central canal or neuroforaminal stenosis.   IMPRESSION:   1. Transitional lumbosacral anatomy, which for the purposes of counting, will be considered a partially sacralized L5 with ankylosis on the right.   2. Postsurgical changes from right L4 laminectomy and right L4-L5 facetectomy. Stabilization with bilateral L4-L5 transpedicular screws and interbody spacer is present. There is no CT evidence of orthopedic  hardware complication.   3. Severe left L4-L5 facet disease and mild left L4 neuroforaminal narrowing.     EXAM: XR LUMBAR SPINE 2 OR 3-VIEWS dated 09/18/2014 3:55 PM   CLINICAL HISTORY: Other - Must specify in Comments field; uprights in brace;   COMPARISON: 09/17/2014   FINDINGS:   2 views were submitted. There are 6 nonrib-bearing lumbar vertebral bodies with partial lumbarization of S1. A small disc space is seen at S1-S2. The lowest normal-appearing disc space which is fusiform PD and L5-S1 for the purposes of this examination. Patient is status post L5-S1 posterior spinal fusion with vertical rods and intrapedicular screws. An interbody disc spacer is seen at the fused level. The vertebral body heights are aligned and maintained. There is mild disc space narrowing at the fused  site. The bones are diffusely osteopenic. There is calcific atherosclerosis of the abdominal aorta.   IMPRESSION:   Status post L5-S1 posterior spinal fusion; in anatomic alignment.      Consulting Services (include reason)         Allergies     No Known Allergies    Discharge Medications        Medication List      TAKE these medications, which are NEW      Quantity/Refills    bisacodyl 5 mg EC tablet   Commonly known as:  DULCOLAX   Take 2 tablets (10 mg total) by mouth daily as needed for Constipation.    Quantity:  30 tablet   Refills:  0       methocarbamol 500 MG tablet   Commonly known as:  ROBAXIN   Take 1-2 tablets (500-1,000 mg total) by mouth every 6 hours as needed (muscle spasm).    Refills:  0       oxyCODONE-acetaminophen 5-325 mg per tablet   Commonly known as:  PERCOCET   Take 1-2 tablets by mouth every 4 hours as needed for Pain.    Quantity:  30 tablet   Refills:  0       senna 8.6 mg tablet   Commonly known as:  SENOKOT   Take 1 tablet by mouth 2 times a day.    Quantity:  30 tablet   Refills:  0         TAKE these medications, which you were ALREADY TAKING      Quantity/Refills    atenolol 25 MG tablet   Commonly known as:  TENORMIN   Take 25 mg by mouth daily.    Refills:  0       atorvastatin 20 MG tablet   Commonly known as:  LIPITOR   Take 20 mg by mouth daily.    Refills:  0       CO Q-10 10 mg capsule   Generic drug:  coenzyme Q10   Take 10 mg by mouth daily.    Refills:  0       lisinopril 2.5 MG tablet   Commonly known as:  PRINIVIL,ZESTRIL   Take 2.5 mg by mouth daily.    Refills:  0       LORazepam 1 MG tablet   Commonly known as:  ATIVAN   Take 1 mg by mouth 3 times a day as needed for Anxiety.    Refills:  0       omeprazole 20 MG capsule   Commonly known as:  PRILOSEC   Take 20 mg by mouth every morning before breakfast.  Refills:  0         STOP taking these medications         aspirin 81 MG EC tablet       fish oil-omega-3 fatty acids 300-1,000 mg capsule       meloxicam 15 MG  tablet   Commonly known as:  MOBIC         Where to Get Your Medications    Information on where to get these meds is not yet available. Ask your nurse or doctor.        -  bisacodyl 5 mg EC tablet   -  methocarbamol 500 MG tablet   -  oxyCODONE-acetaminophen 5-325 mg per tablet   -  senna 8.6 mg tablet                      Discharge Exam     Physical Exam      Reason for Admission     Craig Foley is a 80 y.o. male with PMH of lumbar stenosis who presented for elective decompression fusion fixation.       Hospital Course     Active Hospital Problems    Diagnosis Date Noted   ??? Spondylolisthesis of lumbar region [738.4] 09/17/2014      Resolved Hospital Problems    Diagnosis Date Noted Date Resolved   No resolved problems to display.     Patient tolerated the procedure well and was post-operatively admitted to the Neurosurgical ward. Pain was well controlled on PO opiates and muscle relaxants. Diet was progressively advanced. Patient underwent CT lumbar spine on POD 1 which demonstrated proper positioning of surgical construct and no evidence of complication. He was mobilized on POD 1 with PT/OT with recommendations for 24 hour supervision. Patient reported improvement in pre-operative symptoms.  He was discharged home in stable condition on POD 2.         Condition on Discharge     1. Functional Status: normal   Describe limitations, if any:     2. Mental Status: Alert/Oriented   Describe limitations, if any:     3. Dietary Restrictions / Tube Feeding / TPN  Diet Orders         Diet regular starting at 10/19 2048        As listed above    4. Discharge specific orders:   See AVS     5. Core measures followed: (if this is a core measure patient)  Discharge Weight: 225 lb 4.8 oz (102.195 kg)          Disposition     Home with Home Health      Follow-Up Appointments     No future appointments.  Lonny Prude, MD  [phone] [address]  Laurell Josephs. [address] [place] [address]  [phone]    Schedule an appointment as soon as  possible for a visit in 2 weeks        Signed:    Illene Labrador, CNP  09/19/2014, 8:43 AM     Reminders (do not erase information below until ready to sign the note):  (1) Utilize the route function within Epic to deliver this summary to the primary care physician, Non Staff Referal Provider Gen, and any other physician who will be involved in transitional care    (2) Update the actual date/time of this note so that it appears at the appropriate place in the medical record    (  3) Refresh smart links before signing, but after running the discharge navigator  (particularly the medication reconciliation list)

## 2014-09-19 NOTE — Unmapped (Signed)
Occupational Therapy  Occupational Therapy Treatment/discharge     Name: Craig Foley  DOB: Jul 27, 1934  Attending Physician: Lonny Prude, MD  Admission Diagnosis: Spondylolisthesis of lumbar region [M43.16]  Date: 09/19/2014  Precautions:  Back precautions, back brace when OOB  Reviewed Pertinent hospital course: Yes   Hospital Course PT/OT: Pt is a 80 y/o male s/p lumbar 4-5 decompression with fusion and fixation and with R synovial cyst resection      Assessment  Assessment: Decreased ADL status;Decreased Functional Mobility;Decreased activity tolerance  Prognosis: Good  Goal Formulation: Patient    Goals  Pt Will demonstrate supine to sit to prep for ADLs:  (met)  Pt Will demonstrate functional chair transfer:  (met)  Pt Will demonstrate toilet transfer:  (met)  Pt Will demonstrate grooming task:  (met)  Pt Will demonstrate LE ADLs:  (d/c goal, wife plans to assist as needed)  Miscellaneous Goal #1:  (met)  Time frame for goals to be met in: 7 days, 09-25-14    Recommendation  Plan  Treatment Interventions: ADL retraining;Energy Conservation;Neuro muscular reeducation;Patient/Family training;Functional transfer training;Therapeutic Activity  OT Frequency: 5-10x/wk  Recommendation  Recommendation: Home with 24 hour supervision/assistance  Equipment Recommended: Patient has needed bathroom DME      Problem List  Patient Active Problem List   Diagnosis   ??? Spondylolisthesis of lumbar region        Past Medical History  Past Medical History   Diagnosis Date   ??? Coronary artery disease    ??? Hypertension    ??? HLD (hyperlipidemia)    ??? Atrial flutter    ??? GERD (gastroesophageal reflux disease)    ??? OSA on CPAP    ??? Cancer      skin- BCC on (L) ear        Past Surgical History  Past Surgical History   Procedure Laterality Date   ??? Coronary artery bypass graft  2003     x4   ??? Cardiac electrophysiology study and ablation  2013     atrial flutter   ??? Esophageal dilation  2010   ??? Eye surgery Bilateral      cataracts      ??? Endoscopic release transverse carpal ligament of hand Bilateral    ??? Vein ligation and stripping     ??? Skin cancer excision     ??? Lumbar fusion N/A 09/17/2014     Procedure: LUMBAR 4-5 DECOMPRESSION WITH FUSION AND FIXATION AND RIGHT SYNOVIAL CYST RESECTION USING MINIMALLY INVASIVE TECHNIQUES;  Surgeon: Lonny Prude, MD;  Location: [facility] OR;  Service: Neurosurgery;  Laterality: N/A;       Cognition  Overall Cognitive Status: Within Functional Limits    Pain  Pain Score:   5  Pain Location: Back  Pain Descriptors: Sore  Pain Intervention(s): Ambulation/increased activity;Repositioned     Mobility  Bed Mobility  Rolling: Supervision  Supine to Sit: Supervision  Functional Transfers  Sit to Stand: Supervision  Bed to Chair: Supervision  Chair to Bed: Supervision  Toilet Transfers: Supervision  Shower Transfers: Supervision (simulated)  Car Transfers: Supervision (simulated)        ADL  Eating Assistance: Independent  Grooming Assistance: Independent  UE Dressing Assistance:  (SUPV with donning brace, min VC needed for orientation of brace)  LE Dressing Assistance: Minimal (for donning socks, pt declining AE, wife states she will assist pt as needed with LE dressing at home)    Patient Education  Car transfers, shower transfers, donning back brace,  home modifications post spine surgery    Pt and wife demonstrate good understanding of back precautions for ADLs.  Pt and wife have no concerns regarding ADLs for returning home. Will have assist from family as needed. Recommend home with family at time of D/C. No further acute OT indicated. Goals met. D/C acute OT.    Pt in chair with needs in reach at end of session.     Time  Start Time: 0926  Stop Time: 0955  Time Calculation (min): 29 min    Charges       $Self Care/ADL/Home Management Training: 23-37 mins         Ronalee Red OTR/L #[id number]

## 2014-09-19 NOTE — Unmapped (Signed)
-  No bending, twisting, or lifting > 5 pounds  -No strenuous activity, nothing more strenuous than walking. Short, frequent walks are encouraged  -Wear brace at all time when out of bed  -wash incision daily. Otherwise Leave incision open to air. May shower. Do not soak incision, no tub baths for at least 14 days  -Do not drive or operate heavy machinery while on narcotics   -Do not exceed 4,000mg  acetaminophen in a 24 hour period  -No aspirin, advil, ibuprofen or other blood thinners   -Please call the office or return to ED for fever >101.5 or purulent drainage from wound   -May call nurse practitioner with concerns or questions at [phone]

## 2014-09-19 NOTE — Unmapped (Signed)
Agree 

## 2014-09-19 NOTE — Unmapped (Signed)
Discharge instructions reviewed with patient, all questions answered. Prescriptions and patient belongings sent with patient. No further needs. LINDSEY CALLAHAN, RN

## 2014-09-21 NOTE — Unmapped (Signed)
[place]                              [facility]     PATIENT NAME:   Craig Foley, Craig Foley               MRN: [id number]  DATE OF BIRTH:  1933/12/11                     CSN: [id number]  SURGEON:        Lonny Prude, M.D.            ADMIT DATE: 09/17/2014  SERVICE:        General Surgery  DICTATED BY:    Lonny Prude, M.D.            SURGERY DATE: 09/17/2014                                    OPERATIVE REPORT     COMPLICATIONS:  None.     SPECIMEN(S):  None.     DRAIN(S):  None.     ANESTHESIA:   General endotracheal anesthesia.     ESTIMATED BLOOD LOSS:  Minimal.     SURGEON:  Lonny Prude, M.D.     ASSISTANT(S):  Morley Kos, M.D., resident neurosurgeon.     PREOPERATIVE DIAGNOSIS(ES):  1. L4-5 spondylolisthesis.  2. Synovial cyst L4-5 on the right.  3. Lumbar stenosis with radiculopathy.     POSTOPERATIVE DIAGNOSIS(ES):  1. L4-5 spondylolisthesis.  2. Synovial cyst L4-5 on the right.  3. Lumbar stenosis with radiculopathy.     PROCEDURE(S) PERFORMED:  1. Posterior lumbar approach paramedian.  2. Posterior lumbar decompressive laminectomy L4-5 for decompression of     thecal sac and nerve roots.  3. L4-5 standard transforaminal lumbar interbody fusion with PEEK graft     packed with autograft harvested from the surgical site and demineralized     bone matrix.  4. Pedicle screw fixation L4-5.  5. L4-5 synovial cyst resection.  6. Microscope for careful microdissection.  7. Fluoroscopy for fluoroscopic guidance.     INDICATION(S):  The patient is a very pleasant gentleman with a history of  progressive low back and leg pain associated with severe stenosis at L4-5  secondary to a spondylolisthesis and a right-sided synovial cyst.  Despite  conservative therapy he had persistent symptoms and after discussion of  treatment options he elected to undergo lumbar decompression with fusion and  fixation.     DETAILS OF PROCEDURE(S):  The patient was taken to the operating room  and  after the induction of successful endotracheal intubation, the appropriate  vascular catheters were placed for the duration of the procedure.  He had  initiation of spinal cord monitoring begun at this time. He was given  preoperative antibiotics and placed in sequential decompression devices for  DVT prophylaxis.  He was then positioned prone on a Jackson table with hip  and chest pads.  All pressure points were padded prior to final positioning.  The skin over the lumbar spine was washed with alcohol and sterilely prepped  and draped in the usual fashion.     Using biplanar AP and lateral fluoroscopy, paramedian incisions were made and  planned just lateral to the L4-5 pedicle.  Sharp dissection was taken down  through the subcutaneous tissues to the fascia which  was opened in a vertical  fashion with blunt finger dissection exposing the L4-5 lamina and facet,  transverse process bilaterally.   Location was confirmed with fluoroscopic  imaging and I turned my attention to pedicle screw targeting.     Using standard transpedicular pedicle targeting techniques, Jamshedis were  used to target the L4-5 pedicles bilaterally, placed K-wires in appropriate  trajectories.  The left-sided pedicle screws were placed over the K-wires and  confirmed to be in good position on AP and lateral fluoroscopy.  All screws  were eventually electronically tested and confirmed to be in good position on  AP and lateral fluoroscopy.   Once the left-sided pedicle screws were placed,  a series of dilators were used to place an Harmony port retractor over the  L4-5 interspace.  Then the microscope was brought into the field and I turned  my attention to decompression, synovial cyst resection, and interbody fusion.     Under the operating microscope using careful microscopic dissection  techniques, a total L4-5 laminectomy with medial facetectomy was performed to  expose the ligamentum flavum to its entire length.  The ligament  was  carefully freed and dissected from the thecal sac and nerve roots and removed  using a series of 2 and 3 mm punches to allow for excellent central and  lateral recess and foraminal decompression at the L4-5 level.  The synovial  cyst was identified and resected after careful dissection from the thecal sac  and nerve roots.  Once the decompression and synovial cyst resection were  completed, the L4-5 disc was identified and incised and cleaned of all loose  disc material.  A series of curettes, pituitaries, and rasps were used to  remove the disc material and prepare the endplates for interbody grafting.  Using a series of trials, an appropriate PEEK graft was selected and packed  with autograft harvested from the surgical site and demineralized bone matrix  and placed in the midline at L4-5 for standard transforaminal lumbar  interbody fusion after total facetectomy was performed on the right.  AP and  lateral fluoroscopy confirmed excellent position of the interbody graft.  Prior to graft placement, the interspace was irrigated with antibiotic  solution and prepacked with autograft harvested from the surgical site and  demineralized bone matrix.  Once the interbody fusion was complete, the  Harmony port retractor was removed from the field.  Of note, during the  interbody fusion portion of the procedure an incidental durotomy was  identified and repaired with a series of Nurolon stitches and Tisseel.  Once  the Harmony port retractor was removed, the pedicle screws were placed on the  right for bilateral L4-5 pedicle screw fixation.  AP and lateral fluoroscopy  confirmed excellent position of all instrumentation and all the screws were  electronically test and confirmed to be in good position.  Then, prebent  lordotic rods were fixed to the screws at L4-5, and final tightened and  locked in good position for reduction of the spondylolisthesis and fixation  across the L4-5 interspace.  The available cortical  surfaces on the left were  decorticated for posterolateral fusion at L4-5 on the left with a mixture of  autograft harvested from the surgical site, demineralized bone matrix and  allograft chips. Then the wounds were copiously irrigated with antibiotic  solution and closed in a standard layered fashion with Dermabond to the skin.     According to CMS guidelines I was  present for the entire  duration of the  procedure.                                               Lonny Prude, M.D.  BC/kp  D:  09/21/2014 17:44  T:  09/23/2014 11:10  Job #:  [id number]     c:   [facility]     OPERATIVE REPORT                                             PAGE    1 of   1

## 2014-10-30 ENCOUNTER — Inpatient Hospital Stay: Admit: 2014-10-30 | Payer: MEDICARE

## 2014-10-30 DIAGNOSIS — Z981 Arthrodesis status: Secondary | ICD-10-CM

## 2014-12-18 ENCOUNTER — Inpatient Hospital Stay: Admit: 2014-12-18 | Payer: MEDICARE

## 2014-12-18 DIAGNOSIS — Z4789 Encounter for other orthopedic aftercare: Secondary | ICD-10-CM

## 2015-03-27 ENCOUNTER — Inpatient Hospital Stay: Admit: 2015-03-27 | Payer: MEDICARE

## 2015-03-27 DIAGNOSIS — M438X4 Other specified deforming dorsopathies, thoracic region: Secondary | ICD-10-CM

## 2020-02-19 ENCOUNTER — Other Ambulatory Visit: Payer: Self-pay

## 2020-02-19 ENCOUNTER — Ambulatory Visit: Payer: Medicare Other | Admitting: Family Medicine

## 2020-02-19 ENCOUNTER — Encounter: Payer: Self-pay | Admitting: Family Medicine

## 2020-02-19 VITALS — BP 148/76 | HR 67 | Ht 69.5 in | Wt 225.8 lb

## 2020-02-19 DIAGNOSIS — I10 Essential (primary) hypertension: Secondary | ICD-10-CM

## 2020-02-19 DIAGNOSIS — I480 Paroxysmal atrial fibrillation: Secondary | ICD-10-CM

## 2020-02-19 DIAGNOSIS — I4892 Unspecified atrial flutter: Secondary | ICD-10-CM

## 2020-02-19 DIAGNOSIS — E785 Hyperlipidemia, unspecified: Secondary | ICD-10-CM

## 2020-02-19 DIAGNOSIS — F419 Anxiety disorder, unspecified: Secondary | ICD-10-CM

## 2020-02-19 DIAGNOSIS — N400 Enlarged prostate without lower urinary tract symptoms: Secondary | ICD-10-CM

## 2020-02-19 DIAGNOSIS — Z950 Presence of cardiac pacemaker: Secondary | ICD-10-CM | POA: Insufficient documentation

## 2020-02-19 DIAGNOSIS — Z951 Presence of aortocoronary bypass graft: Secondary | ICD-10-CM

## 2020-02-19 DIAGNOSIS — G4700 Insomnia, unspecified: Secondary | ICD-10-CM

## 2020-02-19 DIAGNOSIS — I440 Atrioventricular block, first degree: Secondary | ICD-10-CM

## 2020-02-19 DIAGNOSIS — Z981 Arthrodesis status: Secondary | ICD-10-CM | POA: Diagnosis not present

## 2020-02-19 DIAGNOSIS — E669 Obesity, unspecified: Secondary | ICD-10-CM

## 2020-02-19 DIAGNOSIS — I340 Nonrheumatic mitral (valve) insufficiency: Secondary | ICD-10-CM

## 2020-02-19 MED ORDER — WARFARIN SODIUM 5 MG PO TABS: 5.0000 mg | ORAL_TABLET | Freq: Every day | ORAL | 3 refills | Status: DC

## 2020-02-19 MED ORDER — TAMSULOSIN HCL 0.4 MG PO CAPS: 0.4000 mg | ORAL_CAPSULE | Freq: Every day | ORAL | 3 refills | Status: DC

## 2020-02-19 MED ORDER — FUROSEMIDE 40 MG PO TABS: 40.0000 mg | ORAL_TABLET | Freq: Every day | ORAL | 3 refills | Status: DC

## 2020-02-19 MED ORDER — ATORVASTATIN CALCIUM 20 MG PO TABS: 20.0000 mg | ORAL_TABLET | Freq: Every day | ORAL | 3 refills | Status: DC

## 2020-02-19 MED ORDER — POTASSIUM CHLORIDE ER 10 MEQ PO TBCR: 10.0000 meq | EXTENDED_RELEASE_TABLET | Freq: Every day | ORAL | 3 refills | Status: DC

## 2020-02-19 NOTE — Patient Instructions (Signed)
   Considerations for sleep:  L-Theanine:  100-200 mg one hour before bed time  Phosphatidylserine:  300-600 mg one hour before bed time  Melatonin:  2-10 mg one hour before bed time  Magnesium:  400 mg one hour before bed time

## 2020-02-19 NOTE — Progress Notes (Signed)
Office Visit Note   Patient: Harry Kline           Date of Birth: 03-15-1934           MRN: [id number] Visit Date: 02/19/2020 Requested by: No referring provider defined for this encounter. PCP: Eunice Blase, MD  Subjective: Chief Complaint  Patient presents with  . establish primary care    HPI: He is here to establish care.  He recently moved here from [place] after his wife died due to a lengthy illness.  His son Aaron Edelman is a patient of mine.  He is a retired Software engineer.  He has struggled intermittently with atrial fibrillation and atrial flutter.  This year he developed bradycardia and was taken off metoprolol.  The plan was to have cardioversion, but his blood pressure and pulse were too low so a Micra pacemaker was implanted in January.  He has done well with this so far.  He needs to establish with a cardiologist who is familiar with this new technique.  He is on Coumadin but is hopeful that he can be taken off this at some point.  He has history of coronary artery disease and is status post CABG x4 about 15 years ago.  He would like to establish with a general cardiologist closer to where he lives.  He has hyperlipidemia treated with Lipitor.  Blood pressure has generally been well controlled when he checks it on his own.  He has a history of BPH which is well controlled with Flomax.  He has been having troubles with sleep related to the stress of being the primary caretaker for his wife.  He has weaned off hypnotics and is now on Benadryl combined with melatonin.  Ultimately he would like to get off Benadryl as well.  He brought a summary of his medical conditions, his medications, and his most recent labs.  From a lab standpoint, he has had intermittently elevated blood sugar readings.  His A1c was normal.               ROS:   All other systems were reviewed and are negative.  Objective: Vital Signs: BP (!) 148/76   Pulse 67   Ht 5' 9.5" (1.765 m)   Wt 225 lb 12.8 oz  (102.4 kg)   BMI 32.87 kg/m   Physical Exam:  General:  Alert and oriented, in no acute distress. Pulm:  Breathing unlabored. Psy:  Normal mood, congruent affect.  Neck: No carotid bruits, no thyromegaly or nodules. CV: Regular rate and rhythm without murmurs, rubs, or gallops.  No peripheral edema.  2+ radial and posterior tibial pulses. Lungs: Clear to auscultation throughout with no wheezing or areas of consolidation.    Imaging: None today  Assessment & Plan: 1.  Status post pacemaker implant for atrial fibrillation -Clinically doing well.  We will refer him to [facility] for monitoring of his new pacemaker, and to Dr. Stanford Breed for routine cardiology monitoring.  2.  Hypertension, generally well controlled -No changes today.  We will see him back in 3 to 4 months for recheck.  3.  Sleep disturbance -He will try theanine in addition to melatonin.  Magnesium as well.  4.  BPH -Doing well, refilled Flomax.  5.  Hyperlipidemia, tolerating Lipitor.  6.  Status post lumbar fusion, clinically doing well.  7.  Hyperglycemia -He plans to start eating better and working on weight loss with exercise.  Recheck labs with A1c in about 3 to 4 months.  Procedures: No procedures performed  No notes on file     PMFS History: Patient Active Problem List   Diagnosis Date Noted  . Pacemaker 02/19/2020  . History of lumbar fusion 02/19/2020  . Hyperlipidemia 02/19/2020  . BPH (benign prostatic hyperplasia) 02/19/2020  . Hypertension 02/19/2020  . Atrial flutter ([place]) 02/19/2020  . Paroxysmal atrial fibrillation ([place]) 02/19/2020  . Mitral valve regurgitation 02/19/2020  . First degree AV block 02/19/2020  . Anxiety 02/19/2020  . Obesity (BMI 30.0-34.9) 02/19/2020   History reviewed. No pertinent past medical history.  History reviewed. No pertinent family history.  History reviewed. No pertinent surgical history. Social History   Occupational History  . Not on file    Tobacco Use  . Smoking status: Not on file  Substance and Sexual Activity  . Alcohol use: Not on file  . Drug use: Not on file  . Sexual activity: Not on file

## 2020-02-21 ENCOUNTER — Encounter: Payer: Self-pay | Admitting: Cardiology

## 2020-02-21 ENCOUNTER — Ambulatory Visit: Payer: Medicare Other | Admitting: Cardiology

## 2020-02-21 ENCOUNTER — Other Ambulatory Visit: Payer: Self-pay

## 2020-02-21 VITALS — BP 163/87 | HR 65 | Ht 69.5 in | Wt 228.1 lb

## 2020-02-21 DIAGNOSIS — I2510 Atherosclerotic heart disease of native coronary artery without angina pectoris: Secondary | ICD-10-CM | POA: Diagnosis not present

## 2020-02-21 DIAGNOSIS — E7800 Pure hypercholesterolemia, unspecified: Secondary | ICD-10-CM

## 2020-02-21 DIAGNOSIS — I10 Essential (primary) hypertension: Secondary | ICD-10-CM

## 2020-02-21 DIAGNOSIS — I480 Paroxysmal atrial fibrillation: Secondary | ICD-10-CM | POA: Diagnosis not present

## 2020-02-21 NOTE — Patient Instructions (Signed)
Medication Instructions:  STOP ASPIRIN  *If you need a refill on your cardiac medications before your next appointment, please call your pharmacy*   Lab Work: If you have labs (blood work) drawn today and your tests are completely normal, you will receive your results only by: Marland Kitchen MyChart Message (if you have MyChart) OR . A paper copy in the mail If you have any lab test that is abnormal or we need to change your treatment, we will call you to review the results.  Follow-Up: At [facility], you and your health needs are our priority.  As part of our continuing mission to provide you with exceptional heart care, we have created designated Provider Care Teams.  These Care Teams include your primary Cardiologist (physician) and Advanced Practice Providers (APPs -  Physician Assistants and Nurse Practitioners) who all work together to provide you with the care you need, when you need it.  We recommend signing up for the patient portal called "MyChart".  Sign up information is provided on this After Visit Summary.  MyChart is used to connect with patients for Virtual Visits (Telemedicine).  Patients are able to view lab/test results, encounter notes, upcoming appointments, etc.  Non-urgent messages can be sent to your provider as well.   To learn more about what you can do with MyChart, go to [email or web address].    Your next appointment:   3 month(s)  The format for your next appointment:   In Person

## 2020-02-21 NOTE — Progress Notes (Signed)
Referring-Michael Hilts MD Reason for referral-coronary artery disease  HPI: 87 year old male for evaluation of coronary artery disease at the request of Eunice Blase MD.  He lived in [place].  Is status post coronary artery bypass and graft approximately 15 years ago.  Also with history of paroxysmal atrial fibrillation/flutter and has had previous pacemaker.  Patient moved to this area to be close to family.  He denies dyspnea on exertion, orthopnea, PND, palpitations, syncope or chest pain.  Occasional minimal pedal edema.  Cardiology now asked to evaluate.  Current Outpatient Medications  Medication Sig Dispense Refill  . atorvastatin (LIPITOR) 20 MG tablet Take 1 tablet (20 mg total) by mouth daily. 90 tablet 3  . Coenzyme Q10 (CO Q 10) 100 MG CAPS Take by mouth daily.    . diphenhydrAMINE (BENADRYL) 25 MG tablet Take 25 mg by mouth at bedtime as needed.    . docusate sodium (COLACE) 100 MG capsule Take 100 mg by mouth daily.    . furosemide (LASIX) 40 MG tablet Take 1 tablet (40 mg total) by mouth daily. 90 tablet 3  . Melatonin 10 MG CAPS Take by mouth at bedtime.    . Multiple Vitamin (MULTIVITAMIN) tablet Take 1 tablet by mouth daily.    . Omega 3 1000 MG CAPS Take by mouth daily.    . potassium chloride (KLOR-CON) 10 MEQ tablet Take 10 mEq by mouth daily.    . potassium chloride (KLOR-CON) 10 MEQ tablet Take 1 tablet (10 mEq total) by mouth daily. 90 tablet 3  . tamsulosin (FLOMAX) 0.4 MG CAPS capsule Take 1 capsule (0.4 mg total) by mouth daily. 90 capsule 3  . warfarin (COUMADIN) 5 MG tablet Take 1 tablet (5 mg total) by mouth daily. 90 tablet 3   No current facility-administered medications for this visit.    No Known Allergies   Past Medical History:  Diagnosis Date  . Atrial fibrillation ([place])   . CAD (coronary artery disease)   . Hyperlipidemia   . Hypertension   . OSA (obstructive sleep apnea)   . Pacemaker     Past Surgical History:  Procedure Laterality  Date  . CORONARY ARTERY BYPASS GRAFT    . TONSILLECTOMY      Social History   Socioeconomic History  . Marital status: Widowed    Spouse name: Not on file  . Number of children: 3  . Years of education: Not on file  . Highest education level: Not on file  Occupational History  . Not on file  Tobacco Use  . Smoking status: Former Research scientist (life sciences)  . Smokeless tobacco: Never Used  Substance and Sexual Activity  . Alcohol use: Yes    Comment: Occasional  . Drug use: Never  . Sexual activity: Not on file  Other Topics Concern  . Not on file  Social History Narrative  . Not on file   Social Determinants of Health   Financial Resource Strain:   . Difficulty of Paying Living Expenses:   Food Insecurity:   . Worried About Charity fundraiser in the Last Year:   . Arboriculturist in the Last Year:   Transportation Needs:   . Film/video editor (Medical):   Marland Kitchen Lack of Transportation (Non-Medical):   Physical Activity:   . Days of Exercise per Week:   . Minutes of Exercise per Session:   Stress:   . Feeling of Stress :   Social Connections:   . Frequency of  Communication with Friends and Family:   . Frequency of Social Gatherings with Friends and Family:   . Attends Religious Services:   . Active Member of Clubs or Organizations:   . Attends Archivist Meetings:   Marland Kitchen Marital Status:   Intimate Partner Violence:   . Fear of Current or Ex-Partner:   . Emotionally Abused:   Marland Kitchen Physically Abused:   . Sexually Abused:     Family History  Problem Relation Age of Onset  . Heart failure Mother     ROS: no fevers or chills, productive cough, hemoptysis, dysphasia, odynophagia, melena, hematochezia, dysuria, hematuria, rash, seizure activity, orthopnea, PND, pedal edema, claudication. Remaining systems are negative.  Physical Exam:   Blood pressure (!) 163/87, pulse 65, height 5' 9.5" (1.765 m), weight 228 lb 1.9 oz (103.5 kg).  General:  Well developed/well nourished  in NAD Skin warm/dry Patient not depressed No peripheral clubbing Back-normal HEENT-normal/normal eyelids Neck supple/normal carotid upstroke bilaterally; no bruits; no JVD; no thyromegaly chest - CTA/ normal expansion CV - RRR/normal S1 and S2; no murmurs, rubs or gallops;  PMI nondisplaced Abdomen -NT/ND, no HSM, no mass, + bowel sounds, no bruit 2+ femoral pulses, no bruits Ext-no edema, chords, 2+ DP Neuro-grossly nonfocal  ECG -probable ventricular pacing with underlying atrial fibrillation.  Personally reviewed  A/P  1 coronary artery disease status post coronary artery bypass graft-plan to continue statin.  Given need for anticoagulation will discontinue aspirin.  Patient denies chest pain.  2 persistent atrial fibrillation-patient apparently has been in atrial fibrillation since last fall.  I reviewed options including rate control versus rhythm control.  He is asymptomatic with no increased dyspnea or fatigue.  I would therefore favor rate control and anticoagulation.  His rate is controlled on no medications.  We will continue with Coumadin with goal INR 2-3.  We will arrange follow-up in the Coumadin clinic.  I discussed apixaban with him today.  Cost was prohibitive previously.  We will look into patient assistance and transition if possible.  3 prior pacemaker-we will arrange for patient to be followed by Dr. Curt Bears in [facility].  4 hyperlipidemia-continue statin.  We will have his most recent labs forwarded to [place] for review.  5 hypertension-blood pressure elevated; however he states typically controlled.  We will continue present medical regimen and follow.  6 lower extremity edema-continue present dose of Lasix.  Kirk Ruths, MD

## 2020-02-22 NOTE — Telephone Encounter (Signed)
Spoke with pt, will mail the patient the savings card for the 1st 30 day free trail. He will send me a message when he gets the card and then I will send the prescription to the pharmacy. He is working on his application for patient assistance. Per the pharm md, he will start eliquis the day after he stops warfarin. Pt agreed with this plan.

## 2020-02-22 NOTE — Telephone Encounter (Signed)
If he decides on apixaban dose would 5 mg twice daily.  Kirk Ruths   Message text

## 2020-03-01 ENCOUNTER — Encounter: Payer: Self-pay | Admitting: *Deleted

## 2020-03-01 NOTE — Progress Notes (Signed)
MD part of the patient assistance complete and waiting for the patient to bring his part by the office so can be faxed to the program.

## 2020-03-07 ENCOUNTER — Telehealth: Payer: Self-pay | Admitting: Cardiology

## 2020-03-07 MED ORDER — APIXABAN 5 MG PO TABS: 5.0000 mg | ORAL_TABLET | Freq: Two times a day (BID) | ORAL | 11 refills | Status: DC

## 2020-03-07 NOTE — Telephone Encounter (Signed)
Patient calling in reference to his mychart message he sent today. He states he was supposed to receive the medication eliquis, but his pharmacy has not received it. He also states he spoke with [facility] about his eliquis application and they said they have not received anything. Please advise.

## 2020-03-07 NOTE — Telephone Encounter (Signed)
Patient states that he has received his 30 day trial for Eliquis and that he needs the prescription sent in. Also states that he spoke with [employer] and they have not received Dr. Jacalyn Lefevre part of the application yet. I advised the patient that I would send in Rx for Eliquis, however Fredia Beets, RN is off today so I am unsure of the status of his application being faxed. I advised him that I would send a message to her and the NL team to follow up on.

## 2020-03-11 ENCOUNTER — Encounter: Payer: Self-pay | Admitting: Cardiology

## 2020-03-11 ENCOUNTER — Other Ambulatory Visit: Payer: Self-pay

## 2020-03-11 ENCOUNTER — Ambulatory Visit: Payer: Medicare Other | Admitting: Cardiology

## 2020-03-11 VITALS — BP 126/82 | HR 81 | Ht 70.5 in | Wt 228.2 lb

## 2020-03-11 DIAGNOSIS — I4821 Permanent atrial fibrillation: Secondary | ICD-10-CM | POA: Diagnosis not present

## 2020-03-11 NOTE — Progress Notes (Signed)
Electrophysiology Office Note   Date:  03/11/2020   ID:  Harry Kline, DOB 07/25/1934, MRN [id number]  PCP:  Eunice Blase, MD  Cardiologist:  Stanford Breed Primary Electrophysiologist:  Jamesetta Greenhalgh Meredith Leeds, MD    Chief Complaint: pacemaker   History of Present Illness: Harry Kline is a 86 y.o. male who is being seen today for the evaluation of pacemaker at the request of Kirk Ruths. Presenting today for electrophysiology evaluation.  He has a history of atrial fibrillation, coronary artery disease, hypertension, hyperlipidemia, OSA.  He is status post Medtronic Micra pacemaker.  Approximately 15 years ago, he had a CABG.  He recently moved from [place] to be closer to his family.  According to the patient, he had a cardioversion last fall.  He was told that he was bradycardic at that time.  He went back into atrial fibrillation and has since had a Medtronic Micra implanted.  He does not have any chest pain or shortness of breath.  He feels well despite his atrial fibrillation.  He appears to be asymptomatic.  Today, he denies symptoms of palpitations, chest pain, shortness of breath, orthopnea, PND, lower extremity edema, claudication, dizziness, presyncope, syncope, bleeding, or neurologic sequela. The patient is tolerating medications without difficulties.    Past Medical History:  Diagnosis Date  . Atrial fibrillation ([place])   . CAD (coronary artery disease)   . Hyperlipidemia   . Hypertension   . OSA (obstructive sleep apnea)   . Pacemaker    Past Surgical History:  Procedure Laterality Date  . CORONARY ARTERY BYPASS GRAFT    . TONSILLECTOMY       Current Outpatient Medications  Medication Sig Dispense Refill  . apixaban (ELIQUIS) 5 MG TABS tablet Take 1 tablet (5 mg total) by mouth 2 (two) times daily. 60 tablet 11  . atorvastatin (LIPITOR) 20 MG tablet Take 1 tablet (20 mg total) by mouth daily. 90 tablet 3  . Coenzyme Q10 (CO Q 10) 100 MG CAPS Take by mouth daily.     . diphenhydrAMINE (BENADRYL) 25 MG tablet Take 25 mg by mouth at bedtime as needed.    . docusate sodium (COLACE) 100 MG capsule Take 100 mg by mouth daily.    . furosemide (LASIX) 40 MG tablet Take 1 tablet (40 mg total) by mouth daily. 90 tablet 3  . Melatonin 10 MG CAPS Take by mouth at bedtime.    . Multiple Vitamin (MULTIVITAMIN) tablet Take 1 tablet by mouth daily.    . Omega 3 1000 MG CAPS Take by mouth daily.    . potassium chloride (KLOR-CON) 10 MEQ tablet Take 1 tablet (10 mEq total) by mouth daily. 90 tablet 3  . tamsulosin (FLOMAX) 0.4 MG CAPS capsule Take 1 capsule (0.4 mg total) by mouth daily. 90 capsule 3   No current facility-administered medications for this visit.    Allergies:   Patient has no known allergies.   Social History:  The patient  reports that he has quit smoking. He has never used smokeless tobacco. He reports current alcohol use. He reports that he does not use drugs.   Family History:  The patient's family history includes Heart failure in his mother.    ROS:  Please see the history of present illness.   Otherwise, review of systems is positive for none.   All other systems are reviewed and negative.    PHYSICAL EXAM: VS:  BP 126/82   Pulse 81   Ht 5' 10.5" (1.791 m)  Wt 228 lb 3.2 oz (103.5 kg)   SpO2 99%   BMI 32.28 kg/m  , BMI Body mass index is 32.28 kg/m. GEN: Well nourished, well developed, in no acute distress  HEENT: normal  Neck: no JVD, carotid bruits, or masses Cardiac: RRR; no murmurs, rubs, or gallops,no edema  Respiratory:  clear to auscultation bilaterally, normal work of breathing GI: soft, nontender, nondistended, + BS MS: no deformity or atrophy  Skin: warm and dry, device pocket is well healed Neuro:  Strength and sensation are intact Psych: euthymic mood, full affect  EKG:  EKG is not ordered today. Personal review of the ekg ordered 02/21/20 shows atrial fibrillation, ventricular paced  Device interrogation is  reviewed today in detail.  See PaceArt for details.   Recent Labs: No results found for requested labs within last 8760 hours.    Lipid Panel  No results found for: CHOL, TRIG, HDL, CHOLHDL, VLDL, LDLCALC, LDLDIRECT   Wt Readings from Last 3 Encounters:  03/11/20 228 lb 3.2 oz (103.5 kg)  02/21/20 228 lb 1.9 oz (103.5 kg)  02/19/20 225 lb 12.8 oz (102.4 kg)      Other studies Reviewed: Additional studies/ records that were reviewed today include: Epic notes    ASSESSMENT AND PLAN:  1.  Permanent atrial fibrillation: Currently asymptomatic.  On Coumadin.  He has been enrolled in Eliquis clinic.    CHA2DS2-VASc of at least 4.  To the patient, he had a Medtronic Micra implanted for slow atrial fibrillation.  Device functioning appropriately.  No changes.  2.  Coronary artery disease status post CABG: Currently on Coumadin without aspirin.  No current chest pain.  3.  Hyperlipidemia: Continue statin  4.  Hypertension: Currently well controlled    Current medicines are reviewed at length with the patient today.   The patient does not have concerns regarding his medicines.  The following changes were made today:  none  Labs/ tests ordered today include:  No orders of the defined types were placed in this encounter.    Disposition:   FU with Skylah Delauter 9 months  Signed, Shawnie Nicole Meredith Leeds, MD  03/11/2020 3:53 PM     [facility] [address] [place] [address] [phone] (office) [phone] (fax)

## 2020-03-11 NOTE — Patient Instructions (Signed)
Medication Instructions:  Your physician recommends that you continue on your current medications as directed. Please refer to the Current Medication list given to you today.  *If you need a refill on your cardiac medications before your next appointment, please call your pharmacy*   Lab Work: None ordered If you have labs (blood work) drawn today and your tests are completely normal, you will receive your results only by: Marland Kitchen MyChart Message (if you have MyChart) OR . A paper copy in the mail If you have any lab test that is abnormal or we need to change your treatment, we will call you to review the results.   Testing/Procedures: None ordered   Follow-Up: At [facility], you and your health needs are our priority.  As part of our continuing mission to provide you with exceptional heart care, we have created designated Provider Care Teams.  These Care Teams include your primary Cardiologist (physician) and Advanced Practice Providers (APPs -  Physician Assistants and Nurse Practitioners) who all work together to provide you with the care you need, when you need it.  We recommend signing up for the patient portal called "MyChart".  Sign up information is provided on this After Visit Summary.  MyChart is used to connect with patients for Virtual Visits (Telemedicine).  Patients are able to view lab/test results, encounter notes, upcoming appointments, etc.  Non-urgent messages can be sent to your provider as well.   To learn more about what you can do with MyChart, go to [email or web address].    Your next appointment:   9 month(s)  The format for your next appointment:   In Person  Provider:   Allegra Lai, MD   Thank you for choosing [facility]!!   Trinidad Curet, RN [phone]    Other Instructions

## 2020-03-20 NOTE — Telephone Encounter (Signed)
Left message for patient, received fax from [employer] asking for more information, information needed re-faxed to the company.

## 2020-03-22 ENCOUNTER — Encounter: Payer: Self-pay | Admitting: *Deleted

## 2020-03-22 NOTE — Telephone Encounter (Signed)
Patient aware via my chart, application received by company.

## 2020-03-25 ENCOUNTER — Encounter: Payer: Self-pay | Admitting: *Deleted

## 2020-03-25 ENCOUNTER — Encounter: Payer: Self-pay | Admitting: Family Medicine

## 2020-03-25 NOTE — Telephone Encounter (Signed)
Fax received and reports the patient's household income is over our current eligible criteria.

## 2020-04-26 ENCOUNTER — Other Ambulatory Visit: Payer: Self-pay

## 2020-04-26 ENCOUNTER — Ambulatory Visit: Payer: Medicare Other | Admitting: Family Medicine

## 2020-04-26 ENCOUNTER — Encounter: Payer: Self-pay | Admitting: Family Medicine

## 2020-04-26 VITALS — BP 97/51 | HR 53

## 2020-04-26 DIAGNOSIS — I10 Essential (primary) hypertension: Secondary | ICD-10-CM

## 2020-04-26 DIAGNOSIS — E7800 Pure hypercholesterolemia, unspecified: Secondary | ICD-10-CM

## 2020-04-26 DIAGNOSIS — M25561 Pain in right knee: Secondary | ICD-10-CM

## 2020-04-26 DIAGNOSIS — G8929 Other chronic pain: Secondary | ICD-10-CM | POA: Diagnosis not present

## 2020-04-26 DIAGNOSIS — N400 Enlarged prostate without lower urinary tract symptoms: Secondary | ICD-10-CM

## 2020-04-26 DIAGNOSIS — Z8639 Personal history of other endocrine, nutritional and metabolic disease: Secondary | ICD-10-CM

## 2020-04-26 NOTE — Progress Notes (Signed)
   Office Visit Note   Patient: Harry Kline           Date of Birth: 10-Aug-1934           MRN: [id number] Visit Date: 04/26/2020 Requested by: Eunice Blase, MD [address] [place],  [place] [address] PCP: Eunice Blase, MD  Subjective: Chief Complaint  Patient presents with  . Right Knee - Pain    Knee has been feeling inflamed/painful medial aspect of knee for quite a while (years). No improvement with voltaren gel, icy hot and other topical creams. Constant dull pain. Hurts with walking up inclines.    HPI: He is here with right knee pain.  Chronic intermittent pain, mostly on the medial aspect.  He had x-rays done in [place] and does not remember the results.  He has tried Voltaren gel, ice, and other topical remedies with minimal improvement.  Naproxen helps but he is concerned about taking it with anticoagulants.               ROS:   All other systems were reviewed and are negative.  Objective: Vital Signs: BP (!) 97/51   Pulse (!) 53   Physical Exam:  General:  Alert and oriented, in no acute distress. Pulm:  Breathing unlabored. Psy:  Normal mood, congruent affect. Skin: No rash or erythema Right knee: 2+ patellofemoral crepitus, trace effusion.  Ligaments feel stable except he has very slight laxity with valgus stress.  He is tender on the medial joint line, no palpable click with McMurray's.  Imaging: No results found.  Assessment & Plan: 1.  Chronic right knee pain, probably due to DJD -Discussed options with him and elected to inject with cortisone today.  If symptoms recur he will contact me and I will try to get approval for gel injections for future use.     Procedures: Right knee steroid injection: After sterile prep with Betadine, injected 5 cc 1% lidocaine without epinephrine and 40 mg methylprednisolone from medial midpatellar approach.   PMFS History: Patient Active Problem List   Diagnosis Date Noted  . Pacemaker 02/19/2020  . History of lumbar  fusion 02/19/2020  . Hyperlipidemia 02/19/2020  . BPH (benign prostatic hyperplasia) 02/19/2020  . Hypertension 02/19/2020  . Atrial flutter ([place]) 02/19/2020  . Paroxysmal atrial fibrillation ([place]) 02/19/2020  . Mitral valve regurgitation 02/19/2020  . First degree AV block 02/19/2020  . Anxiety 02/19/2020  . Obesity (BMI 30.0-34.9) 02/19/2020  . Insomnia 02/19/2020   Past Medical History:  Diagnosis Date  . Atrial fibrillation ([place])   . CAD (coronary artery disease)   . Hyperlipidemia   . Hypertension   . OSA (obstructive sleep apnea)   . Pacemaker     Family History  Problem Relation Age of Onset  . Heart failure Mother     Past Surgical History:  Procedure Laterality Date  . CORONARY ARTERY BYPASS GRAFT    . TONSILLECTOMY     Social History   Occupational History  . Not on file  Tobacco Use  . Smoking status: Former Research scientist (life sciences)  . Smokeless tobacco: Never Used  Substance and Sexual Activity  . Alcohol use: Yes    Comment: Occasional  . Drug use: Never  . Sexual activity: Not on file

## 2020-04-27 ENCOUNTER — Encounter: Payer: Self-pay | Admitting: Family Medicine

## 2020-04-27 NOTE — Addendum Note (Signed)
Addended by: Hortencia Pilar on: 04/27/2020 12:36 PM   Modules accepted: Orders

## 2020-04-30 NOTE — Progress Notes (Signed)
HPI: FU CAD and atrial fibrillation.  He previously lived in [place]; S/P coronary artery bypass and graft approximately 15 years ago. Also with history of paroxysmal atrial fibrillation/flutter and has had previous pacemaker.    Since last seen patient denies dyspnea, chest pain, palpitations or syncope.  No bleeding.  Current Outpatient Medications  Medication Sig Dispense Refill  . apixaban (ELIQUIS) 5 MG TABS tablet Take 1 tablet (5 mg total) by mouth 2 (two) times daily. 60 tablet 11  . atorvastatin (LIPITOR) 20 MG tablet Take 1 tablet (20 mg total) by mouth daily. 90 tablet 3  . Coenzyme Q10 (CO Q 10) 100 MG CAPS Take by mouth daily.    . diphenhydrAMINE (BENADRYL) 25 MG tablet Take 25 mg by mouth at bedtime as needed.    . docusate sodium (COLACE) 100 MG capsule Take 100 mg by mouth daily.    . furosemide (LASIX) 40 MG tablet Take 1 tablet (40 mg total) by mouth daily. 90 tablet 3  . Melatonin 10 MG CAPS Take by mouth at bedtime.    . Multiple Vitamin (MULTIVITAMIN) tablet Take 1 tablet by mouth daily.    . Omega 3 1000 MG CAPS Take by mouth daily.    . potassium chloride (KLOR-CON) 10 MEQ tablet Take 1 tablet (10 mEq total) by mouth daily. 90 tablet 3  . tamsulosin (FLOMAX) 0.4 MG CAPS capsule Take 1 capsule (0.4 mg total) by mouth daily. 90 capsule 3   No current facility-administered medications for this visit.     Past Medical History:  Diagnosis Date  . Atrial fibrillation ([place])   . CAD (coronary artery disease)   . Hyperlipidemia   . Hypertension   . OSA (obstructive sleep apnea)   . Pacemaker     Past Surgical History:  Procedure Laterality Date  . CORONARY ARTERY BYPASS GRAFT    . TONSILLECTOMY      Social History   Socioeconomic History  . Marital status: Widowed    Spouse name: Not on file  . Number of children: 3  . Years of education: Not on file  . Highest education level: Not on file  Occupational History  . Not on file  Tobacco Use  .  Smoking status: Former Research scientist (life sciences)  . Smokeless tobacco: Never Used  Substance and Sexual Activity  . Alcohol use: Yes    Comment: Occasional  . Drug use: Never  . Sexual activity: Not on file  Other Topics Concern  . Not on file  Social History Narrative  . Not on file   Social Determinants of Health   Financial Resource Strain:   . Difficulty of Paying Living Expenses:   Food Insecurity:   . Worried About Charity fundraiser in the Last Year:   . Arboriculturist in the Last Year:   Transportation Needs:   . Film/video editor (Medical):   Marland Kitchen Lack of Transportation (Non-Medical):   Physical Activity:   . Days of Exercise per Week:   . Minutes of Exercise per Session:   Stress:   . Feeling of Stress :   Social Connections:   . Frequency of Communication with Friends and Family:   . Frequency of Social Gatherings with Friends and Family:   . Attends Religious Services:   . Active Member of Clubs or Organizations:   . Attends Archivist Meetings:   Marland Kitchen Marital Status:   Intimate Partner Violence:   . Fear of  Current or Ex-Partner:   . Emotionally Abused:   Marland Kitchen Physically Abused:   . Sexually Abused:     Family History  Problem Relation Age of Onset  . Heart failure Mother     ROS: no fevers or chills, productive cough, hemoptysis, dysphasia, odynophagia, melena, hematochezia, dysuria, hematuria, rash, seizure activity, orthopnea, PND, pedal edema, claudication. Remaining systems are negative.  Physical Exam: Well-developed well-nourished in no acute distress.  Skin is warm and dry.  HEENT is normal.  Neck is supple.  Chest is clear to auscultation with normal expansion.  Cardiovascular exam is regular rate and rhythm.  Abdominal exam nontender or distended. No masses palpated. Extremities show trace edema. neuro grossly intact  ECG-ventricular pacing with underlying atrial fibrillation.  Personally reviewed  A/P  1 coronary artery disease status post  coronary artery bypass graft-patient denies chest pain.  Continue statin.  No aspirin given need for apixaban.  We will obtain all records from [place] concerning previous bypass surgery and LV function.  2 Permanent atrial fibrillation-patient remains asymptomatic.  We will therefore plan rate control and anticoagulation long-term.  Continue apixaban.  He is to have blood work drawn by his primary care physician in the near future.  We will have his most recent hemoglobin and renal function forwarded to [place].  3 prior pacemaker-now followed by electrophysiology.  4 hyperlipidemia-continue statin.  We will have most recent lipids and liver forwarded to [place].  5 hypertension-patient's blood pressure is controlled.  Continue present medical regimen.  6 History of lower extremity edema-continue Lasix.  Kirk Ruths, MD

## 2020-05-01 ENCOUNTER — Ambulatory Visit (INDEPENDENT_AMBULATORY_CARE_PROVIDER_SITE_OTHER): Payer: Medicare Other | Admitting: Cardiology

## 2020-05-01 ENCOUNTER — Encounter: Payer: Self-pay | Admitting: Cardiology

## 2020-05-01 ENCOUNTER — Other Ambulatory Visit: Payer: Self-pay

## 2020-05-01 VITALS — BP 143/80 | HR 63 | Ht 70.0 in | Wt 229.1 lb

## 2020-05-01 DIAGNOSIS — I480 Paroxysmal atrial fibrillation: Secondary | ICD-10-CM | POA: Diagnosis not present

## 2020-05-01 DIAGNOSIS — I4821 Permanent atrial fibrillation: Secondary | ICD-10-CM | POA: Diagnosis not present

## 2020-05-01 DIAGNOSIS — I2510 Atherosclerotic heart disease of native coronary artery without angina pectoris: Secondary | ICD-10-CM | POA: Diagnosis not present

## 2020-05-01 DIAGNOSIS — E7800 Pure hypercholesterolemia, unspecified: Secondary | ICD-10-CM

## 2020-05-01 DIAGNOSIS — I10 Essential (primary) hypertension: Secondary | ICD-10-CM

## 2020-05-01 NOTE — Patient Instructions (Signed)
Medication Instructions:  NO CHANGE *If you need a refill on your cardiac medications before your next appointment, please call your pharmacy*   Lab Work: If you have labs (blood work) drawn today and your tests are completely normal, you will receive your results only by: . MyChart Message (if you have MyChart) OR . A paper copy in the mail If you have any lab test that is abnormal or we need to change your treatment, we will call you to review the results.   Follow-Up: At [facility], you and your health needs are our priority.  As part of our continuing mission to provide you with exceptional heart care, we have created designated Provider Care Teams.  These Care Teams include your primary Cardiologist (physician) and Advanced Practice Providers (APPs -  Physician Assistants and Nurse Practitioners) who all work together to provide you with the care you need, when you need it.  We recommend signing up for the patient portal called "MyChart".  Sign up information is provided on this After Visit Summary.  MyChart is used to connect with patients for Virtual Visits (Telemedicine).  Patients are able to view lab/test results, encounter notes, upcoming appointments, etc.  Non-urgent messages can be sent to your provider as well.   To learn more about what you can do with MyChart, go to [email or web address].    Your next appointment:   6 month(s)  The format for your next appointment:   In Person  Provider:   Brian Crenshaw, MD    

## 2020-05-14 ENCOUNTER — Encounter: Payer: Self-pay | Admitting: Family Medicine

## 2020-05-15 ENCOUNTER — Encounter: Payer: Self-pay | Admitting: Family Medicine

## 2020-05-20 ENCOUNTER — Encounter: Payer: Self-pay | Admitting: Family Medicine

## 2020-05-20 ENCOUNTER — Telehealth: Payer: Self-pay

## 2020-05-20 NOTE — Telephone Encounter (Signed)
I called the patient: he will come in tomorrow morning for fasting blood work (order in the chart already).

## 2020-05-20 NOTE — Telephone Encounter (Signed)
Patient called in wanting to get blood work done but wants to the results to show on his mychart account. Patient just wants to know can he get his blood work here or have to go to another facility

## 2020-05-21 ENCOUNTER — Ambulatory Visit (INDEPENDENT_AMBULATORY_CARE_PROVIDER_SITE_OTHER): Payer: Medicare Other

## 2020-05-21 ENCOUNTER — Other Ambulatory Visit: Payer: Self-pay

## 2020-05-21 DIAGNOSIS — I10 Essential (primary) hypertension: Secondary | ICD-10-CM | POA: Diagnosis not present

## 2020-05-21 DIAGNOSIS — Z8639 Personal history of other endocrine, nutritional and metabolic disease: Secondary | ICD-10-CM

## 2020-05-21 DIAGNOSIS — N400 Enlarged prostate without lower urinary tract symptoms: Secondary | ICD-10-CM

## 2020-05-21 DIAGNOSIS — E7800 Pure hypercholesterolemia, unspecified: Secondary | ICD-10-CM

## 2020-05-21 NOTE — Progress Notes (Signed)
Labs drawn per  Dr. Junius Roads: PSA, thyroid panel + TSH, Hgb A1C, lipid panel, CMET & CBC/diff.

## 2020-05-22 ENCOUNTER — Telehealth: Payer: Self-pay | Admitting: Family Medicine

## 2020-05-22 LAB — THYROID PANEL WITH TSH
Free Thyroxine Index: 2 (ref 1.4–3.8)
T3 Uptake: 30 % (ref 22–35)
T4, Total: 6.6 ug/dL (ref 4.9–10.5)
TSH: 2.25 mIU/L (ref 0.40–4.50)

## 2020-05-22 LAB — LIPID PANEL
Cholesterol: 110 mg/dL (ref <200)
HDL: 41 mg/dL (ref >=40)
LDL Cholesterol (Calc): 51 mg/dL (calc)
Non-HDL Cholesterol (Calc): 69 mg/dL (calc) (ref <130)
Total CHOL/HDL Ratio: 2.7 (calc) (ref <5.0)
Triglycerides: 98 mg/dL (ref <150)

## 2020-05-22 LAB — COMPREHENSIVE METABOLIC PANEL
AG Ratio: 1.8 (calc) (ref 1.0–2.5)
ALT: 20 U/L (ref 9–46)
AST: 21 U/L (ref 10–35)
Albumin: 4.5 g/dL (ref 3.6–5.1)
Alkaline phosphatase (APISO): 68 U/L (ref 35–144)
BUN/Creatinine Ratio: 16 (calc) (ref 6–22)
BUN: 20 mg/dL (ref 7–25)
CO2: 26 mmol/L (ref 20–32)
Calcium: 9.5 mg/dL (ref 8.6–10.3)
Chloride: 103 mmol/L (ref 98–110)
Creat: 1.24 mg/dL — ABNORMAL HIGH (ref 0.70–1.11)
Globulin: 2.5 g/dL (calc) (ref 1.9–3.7)
Glucose, Bld: 98 mg/dL (ref 65–99)
Potassium: 4 mmol/L (ref 3.5–5.3)
Sodium: 140 mmol/L (ref 135–146)
Total Bilirubin: 1.5 mg/dL — ABNORMAL HIGH (ref 0.2–1.2)
Total Protein: 7 g/dL (ref 6.1–8.1)

## 2020-05-22 LAB — CBC WITH DIFFERENTIAL/PLATELET
Absolute Monocytes: 866 cells/uL (ref 200–950)
Basophils Absolute: 31 cells/uL (ref 0–200)
Basophils Relative: 0.4 %
Eosinophils Absolute: 117 cells/uL (ref 15–500)
Eosinophils Relative: 1.5 %
HCT: 44.5 % (ref 38.5–50.0)
Hemoglobin: 15.5 g/dL (ref 13.2–17.1)
Lymphs Abs: 1677 cells/uL (ref 850–3900)
MCH: 35.1 pg — ABNORMAL HIGH (ref 27.0–33.0)
MCHC: 34.8 g/dL (ref 32.0–36.0)
MCV: 100.9 fL — ABNORMAL HIGH (ref 80.0–100.0)
MPV: 9.7 fL (ref 7.5–12.5)
Monocytes Relative: 11.1 %
Neutro Abs: 5109 cells/uL (ref 1500–7800)
Neutrophils Relative %: 65.5 %
Platelets: 182 10*3/uL (ref 140–400)
RBC: 4.41 10*6/uL (ref 4.20–5.80)
RDW: 12.4 % (ref 11.0–15.0)
Total Lymphocyte: 21.5 %
WBC: 7.8 10*3/uL (ref 3.8–10.8)

## 2020-05-22 LAB — HEMOGLOBIN A1C
Hgb A1c MFr Bld: 5.5 % of total Hgb (ref <5.7)
Mean Plasma Glucose: 111 (calc)
eAG (mmol/L): 6.2 (calc)

## 2020-05-22 LAB — PSA: PSA: 2.8 ng/mL (ref <=4.0)

## 2020-05-22 NOTE — Telephone Encounter (Signed)
Labs are notable for the following:  Complete blood count shows a slightly elevated MCV and MCH.  This is probably insignificant, but can sometimes indicate vitamin B12 deficiency.  Could contemplate taking a B complex vitamin once daily to see if it improves energy levels.  Prostate PSA and thyroid function all look normal.  Hemoglobin A1c looks normal.  Kidney function/creatinine is slightly abnormal at 1.24.  I am not able to find the results of your last creatinine level, but I seem to remember it being normal.  We should recheck in a few months to be sure it normalizes or remains stable.  Lipid numbers are on the low side.  With cholesterol too low, other hormone levels can be affected.  Can contribute to depression symptoms, memory troubles, muscle aches, etc.  No changes needed if you're doing ok, but if anything changes, we may need to decrease the statin dosage a little.  All else looks good.

## 2020-05-23 NOTE — Telephone Encounter (Signed)
Noted  

## 2020-06-05 ENCOUNTER — Telehealth: Payer: Self-pay

## 2020-06-05 NOTE — Telephone Encounter (Signed)
Submitted VOB, SynviscOne, right knee. 

## 2020-06-07 ENCOUNTER — Telehealth: Payer: Self-pay

## 2020-06-07 NOTE — Telephone Encounter (Signed)
Patient is aware that he is approved for gel injection.  Approved, SynviscOne, right knee. Lynn Patient will be responsible for 20% OOP. Co-pay of $40.00 required No PA required

## 2020-06-18 ENCOUNTER — Encounter: Payer: Self-pay | Admitting: Family Medicine

## 2020-06-18 ENCOUNTER — Ambulatory Visit: Payer: Medicare Other | Admitting: Family Medicine

## 2020-06-18 ENCOUNTER — Ambulatory Visit: Payer: Self-pay

## 2020-06-18 ENCOUNTER — Other Ambulatory Visit: Payer: Self-pay

## 2020-06-18 DIAGNOSIS — G8929 Other chronic pain: Secondary | ICD-10-CM

## 2020-06-18 DIAGNOSIS — M25561 Pain in right knee: Secondary | ICD-10-CM | POA: Diagnosis not present

## 2020-06-18 DIAGNOSIS — M1711 Unilateral primary osteoarthritis, right knee: Secondary | ICD-10-CM

## 2020-06-18 NOTE — Progress Notes (Signed)
Subjective: He is here for planned ultrasound-guided right knee Synvisc 1 injection.  Objective: 1+ effusion with no warmth.  Procedure: Ultrasound-guided injection: After sterile prep Betadine, injected 3 cc 1% lidocaine without epinephrine and Synvisc-1 from superolateral approach into the superior joint recess.  No immediate complications.

## 2020-08-01 ENCOUNTER — Other Ambulatory Visit: Payer: Self-pay

## 2020-08-19 ENCOUNTER — Encounter: Payer: Self-pay | Admitting: Family Medicine

## 2020-08-19 ENCOUNTER — Ambulatory Visit: Payer: Medicare Other | Admitting: Family Medicine

## 2020-08-19 ENCOUNTER — Other Ambulatory Visit: Payer: Self-pay

## 2020-08-19 DIAGNOSIS — M79674 Pain in right toe(s): Secondary | ICD-10-CM

## 2020-08-19 NOTE — Progress Notes (Signed)
I saw and examined the patient with Dr. Elouise Munroe and agree with assessment and plan as outlined.    Right 2nd toe black on end, no pain.  On anticoagulant chronically due to atrial fib.  Doing a lot of walking for exercise.  Exam reveals a blood blister with good circulation and cap refill.  No heart murmur heard.    Reassurance, obtain better-fitting walking shoes.  Return as needed.

## 2020-08-19 NOTE — Progress Notes (Signed)
Office Visit Note   Patient: Harry Kline           Date of Birth: Dec 08, 1933           MRN: [id number] Visit Date: 08/19/2020 Requested by: Eunice Blase, MD [place],  [place] [address] PCP: Eunice Blase, MD  Subjective: Chief Complaint  Patient presents with  . Right 2nd Toe - Skin Discoloration    No known injury. He has been doing some recent downhill walking. Some tenderness to touch this morning.    HPI: 86yo M presenting to clinic with concerns of right second toe discoloration for the past few days. Patient states that he had a similar lesion on his left foot- which was a small confined blood blister and disappeared within a few days. This toe, however, the discoloration has spread downward to involve his nailbed, and he was scared there was something much more serious going on. He admits that he walks 3-5 mi daily, with several hills on his path; and hypothesizes that his foot slips forward in his shoe when walking downhill. He wears narrow shoes when walking, as he felt as though this made his foot more secure. He can't think of any other trauma to his feet.  His toe is not painful, he denies any systemic symptoms. He has been compliant with his eloquis therapy.               ROS:   All other systems were reviewed and are negative.  Objective: Vital Signs: There were no vitals taken for this visit.  Physical Exam:  General:  Alert and oriented, in no acute distress. Card: Regular rate and rhythm with no murmurs, rubs or gallops.  Pulm:  Breathing unlabored. Psy:  Normal mood, congruent affect. Skin:  Right second toe with red/purple discoloration on the distal tip, with proximal involvement of the nailbed. Brisk capillary refill surrounding this lesion, with no significant surrounding erythema. Overlying skin intact, with no lesions or drainage.  Full ROM of all toes, with intact sensation to light touch. Good ROM at ankle, with brisk pulses. No tenderness  with palpation of affected toe. Full strength is all toes.   Imaging: No results found.  Assessment & Plan: 86yo M with PMHx of Afib, on eloquis, with right second toe discoloration. Examination appears consistent with hemorrhagic blistering vs bruising- likely due to trauma from his shoe while walking downhill. Discussed buying new footwear (Current shoes are very worn), as this may improve his ergonomics with downhill walking and prevent future lesions.  Return precautions discussed.  Patient had no further questions or concerns today.      Procedures: No procedures performed  No notes on file     PMFS History: Patient Active Problem List   Diagnosis Date Noted  . Pacemaker 02/19/2020  . History of lumbar fusion 02/19/2020  . Hyperlipidemia 02/19/2020  . BPH (benign prostatic hyperplasia) 02/19/2020  . Hypertension 02/19/2020  . Atrial flutter ([place]) 02/19/2020  . Paroxysmal atrial fibrillation ([place]) 02/19/2020  . Mitral valve regurgitation 02/19/2020  . First degree AV block 02/19/2020  . Anxiety 02/19/2020  . Obesity (BMI 30.0-34.9) 02/19/2020  . Insomnia 02/19/2020   Past Medical History:  Diagnosis Date  . Atrial fibrillation ([place])   . CAD (coronary artery disease)   . Hyperlipidemia   . Hypertension   . OSA (obstructive sleep apnea)   . Pacemaker     Family History  Problem Relation Age of Onset  . Heart failure  Mother     Past Surgical History:  Procedure Laterality Date  . CORONARY ARTERY BYPASS GRAFT    . TONSILLECTOMY     Social History   Occupational History  . Not on file  Tobacco Use  . Smoking status: Former Research scientist (life sciences)  . Smokeless tobacco: Never Used  Substance and Sexual Activity  . Alcohol use: Yes    Comment: Occasional  . Drug use: Never  . Sexual activity: Not on file

## 2020-09-02 DIAGNOSIS — L570 Actinic keratosis: Secondary | ICD-10-CM | POA: Diagnosis not present

## 2020-09-02 DIAGNOSIS — D1801 Hemangioma of skin and subcutaneous tissue: Secondary | ICD-10-CM | POA: Diagnosis not present

## 2020-09-02 DIAGNOSIS — L821 Other seborrheic keratosis: Secondary | ICD-10-CM | POA: Diagnosis not present

## 2020-09-09 ENCOUNTER — Other Ambulatory Visit: Payer: Self-pay

## 2020-09-09 ENCOUNTER — Encounter: Payer: Self-pay | Admitting: Family Medicine

## 2020-09-09 ENCOUNTER — Ambulatory Visit: Payer: Medicare Other | Admitting: Family Medicine

## 2020-09-09 VITALS — BP 151/74 | HR 80 | Ht 70.0 in | Wt 234.8 lb

## 2020-09-09 DIAGNOSIS — E669 Obesity, unspecified: Secondary | ICD-10-CM

## 2020-09-09 DIAGNOSIS — Z0000 Encounter for general adult medical examination without abnormal findings: Secondary | ICD-10-CM | POA: Diagnosis not present

## 2020-09-09 DIAGNOSIS — Z951 Presence of aortocoronary bypass graft: Secondary | ICD-10-CM

## 2020-09-09 DIAGNOSIS — N400 Enlarged prostate without lower urinary tract symptoms: Secondary | ICD-10-CM

## 2020-09-09 DIAGNOSIS — I10 Essential (primary) hypertension: Secondary | ICD-10-CM

## 2020-09-09 DIAGNOSIS — F419 Anxiety disorder, unspecified: Secondary | ICD-10-CM

## 2020-09-09 DIAGNOSIS — Z8639 Personal history of other endocrine, nutritional and metabolic disease: Secondary | ICD-10-CM

## 2020-09-09 DIAGNOSIS — I4892 Unspecified atrial flutter: Secondary | ICD-10-CM

## 2020-09-09 DIAGNOSIS — E7800 Pure hypercholesterolemia, unspecified: Secondary | ICD-10-CM | POA: Diagnosis not present

## 2020-09-09 DIAGNOSIS — Z950 Presence of cardiac pacemaker: Secondary | ICD-10-CM

## 2020-09-09 DIAGNOSIS — K5900 Constipation, unspecified: Secondary | ICD-10-CM

## 2020-09-09 DIAGNOSIS — R944 Abnormal results of kidney function studies: Secondary | ICD-10-CM | POA: Diagnosis not present

## 2020-09-09 DIAGNOSIS — Z8589 Personal history of malignant neoplasm of other organs and systems: Secondary | ICD-10-CM

## 2020-09-09 NOTE — Progress Notes (Signed)
Office Visit Note   Patient: Harry Kline           Date of Birth: 1934-04-06           MRN: [id number] Visit Date: 09/09/2020 Requested by: Eunice Blase, MD [address] [place],  [place] [address] PCP: Eunice Blase, MD  Subjective: Chief Complaint  Patient presents with   Medicare Wellness    HPI: He is here for annual wellness exam.  He is feeling well overall, no specific concerns other than some troubles with constipation.  He is having bowel movements about every 3 days.  This has been going on for a while.  He has tried increasing his fiber intake.  He does not think he drinks enough fluids.  He is exercising most days of the week.  He does note that he still takes Benadryl every night to help him sleep.  He is sleeping very well.  Symptoms are manageable from a musculoskeletal standpoint.  He recently saw his dermatologist and had several precancerous lesions frozen from his scalp.  Doing well from a cardiology standpoint.  He checks his blood pressure at home and it is always in normal range.  BPH symptoms are minimal.  Immunizations are up-to-date.  He is due for an eye exam.  Dental visits are up-to-date.  He no longer needs colonoscopy.  Family history was reviewed in detail.                ROS:   All other systems were reviewed and are negative.  Objective: Vital Signs: BP (!) 151/74    Pulse 80    Ht 5\' 10"  (1.778 m)    Wt 234 lb 12.8 oz (106.5 kg)    BMI 33.69 kg/m   Physical Exam:  General:  Alert and oriented, in no acute distress. Pulm:  Breathing unlabored. Psy:  Normal mood, congruent affect. Skin:  No worrisome lesions.  HEENT:  [place]/AT, PERRLA, EOM Full, no nystagmus.  Funduscopic examination within normal limits.  No conjunctival erythema.  Tympanic membranes are pearly gray with normal landmarks.  External ear canals are normal.  Nasal passages are clear.  Oropharynx is clear.  No significant lymphadenopathy.  No thyromegaly or nodules.  2+  carotid pulses without bruits. CV: Regular rate and rhythm without murmurs, rubs, or gallops.  No peripheral edema.  2+ radial and posterior tibial pulses. Lungs: Clear to auscultation throughout with no wheezing or areas of consolidation. Abd:  Protuberant, BS active, no HSM. Ext:  Slight ridging of the fingernails.   Imaging: No results found.  Assessment & Plan: 1.  Wellness exam - Call for refills when needed. - Return in 6 months for routine visit.  2.  Constipation - Try stopping benadryl, increasing fluid intake.  3.  Sleep disturbance - If sleep worsens off benadryl, we'll try other options.  4.  History of elevated MCV - Recheck today.  Currently on B vitamins.  5.  Elevated creatinine - Recheck today.  6.  Hyperlipidemia - Recheck today.  If too low, may need to adjust meds.      Procedures: No procedures performed  No notes on file     PMFS History: Patient Active Problem List   Diagnosis Date Noted   History of squamous cell carcinoma 09/09/2020   Pacemaker 02/19/2020   History of lumbar fusion 02/19/2020   Hyperlipidemia 02/19/2020   BPH (benign prostatic hyperplasia) 02/19/2020   Hypertension 02/19/2020   Atrial flutter ([place]) 02/19/2020   Paroxysmal  atrial fibrillation ([place]) 02/19/2020   Mitral valve regurgitation 02/19/2020   First degree AV block 02/19/2020   Anxiety 02/19/2020   Obesity (BMI 30.0-34.9) 02/19/2020   Insomnia 02/19/2020   Past Medical History:  Diagnosis Date   Atrial fibrillation ([facility])    CAD (coronary artery disease)    Hyperlipidemia    Hypertension    OSA (obstructive sleep apnea)    Pacemaker     Family History  Problem Relation Age of Onset   Breast cancer Mother    Cancer Mother    Heart failure Father    Healthy Sister    Diabetes Paternal Grandmother    Heart failure Paternal Grandfather     Past Surgical History:  Procedure Laterality Date   CORONARY ARTERY BYPASS GRAFT       TONSILLECTOMY     Social History   Occupational History   Not on file  Tobacco Use   Smoking status: Former Smoker   Smokeless tobacco: Never Used  Substance and Sexual Activity   Alcohol use: Yes    Comment: Occasional   Drug use: Never   Sexual activity: Not on file

## 2020-09-10 ENCOUNTER — Telehealth: Payer: Self-pay | Admitting: Family Medicine

## 2020-09-10 DIAGNOSIS — D539 Nutritional anemia, unspecified: Secondary | ICD-10-CM

## 2020-09-10 NOTE — Telephone Encounter (Signed)
Test added.   

## 2020-09-10 NOTE — Telephone Encounter (Signed)
Labs show:  Stable kidney function with creatinine of 1.23.  Lipids unchanged.  Can keep as-is since you feel ok, or could try splitting dosage in half and rechecking in 6 months.  CBC shows unchanged MCV and MCH levels.  Will see if I can add B12 and folate levels to yesterday's labs.

## 2020-09-11 ENCOUNTER — Telehealth: Payer: Self-pay | Admitting: Family Medicine

## 2020-09-11 LAB — LIPID PANEL
Cholesterol: 110 mg/dL (ref <200)
HDL: 41 mg/dL (ref >=40)
LDL Cholesterol (Calc): 51 mg/dL (calc)
Non-HDL Cholesterol (Calc): 69 mg/dL (calc) (ref <130)
Total CHOL/HDL Ratio: 2.7 (calc) (ref <5.0)
Triglycerides: 92 mg/dL (ref <150)

## 2020-09-11 LAB — CBC WITH DIFFERENTIAL/PLATELET
Absolute Monocytes: 604 cells/uL (ref 200–950)
Basophils Absolute: 40 cells/uL (ref 0–200)
Basophils Relative: 0.7 %
Eosinophils Absolute: 171 cells/uL (ref 15–500)
Eosinophils Relative: 3 %
HCT: 43 % (ref 38.5–50.0)
Hemoglobin: 15 g/dL (ref 13.2–17.1)
Lymphs Abs: 1636 cells/uL (ref 850–3900)
MCH: 35.5 pg — ABNORMAL HIGH (ref 27.0–33.0)
MCHC: 34.9 g/dL (ref 32.0–36.0)
MCV: 101.9 fL — ABNORMAL HIGH (ref 80.0–100.0)
MPV: 10 fL (ref 7.5–12.5)
Monocytes Relative: 10.6 %
Neutro Abs: 3249 cells/uL (ref 1500–7800)
Neutrophils Relative %: 57 %
Platelets: 155 10*3/uL (ref 140–400)
RBC: 4.22 10*6/uL (ref 4.20–5.80)
RDW: 12.1 % (ref 11.0–15.0)
Total Lymphocyte: 28.7 %
WBC: 5.7 10*3/uL (ref 3.8–10.8)

## 2020-09-11 LAB — BASIC METABOLIC PANEL
BUN/Creatinine Ratio: 24 (calc) — ABNORMAL HIGH (ref 6–22)
BUN: 29 mg/dL — ABNORMAL HIGH (ref 7–25)
CO2: 29 mmol/L (ref 20–32)
Calcium: 9.4 mg/dL (ref 8.6–10.3)
Chloride: 107 mmol/L (ref 98–110)
Creat: 1.23 mg/dL — ABNORMAL HIGH (ref 0.70–1.11)
Glucose, Bld: 95 mg/dL (ref 65–99)
Potassium: 4.1 mmol/L (ref 3.5–5.3)
Sodium: 142 mmol/L (ref 135–146)

## 2020-09-11 LAB — B12 AND FOLATE PANEL
Folate: 20 ng/mL
Vitamin B-12: 815 pg/mL (ref 200–1100)

## 2020-09-11 NOTE — Telephone Encounter (Signed)
B12 and folate levels were normal, so I'm not sure why the MCV level is elevated.  Since it's only borderline-high, I am not concerned about anything bad.  We'll recheck in about 6 months.

## 2020-10-16 ENCOUNTER — Encounter: Payer: Self-pay | Admitting: Family Medicine

## 2020-10-16 DIAGNOSIS — Z23 Encounter for immunization: Secondary | ICD-10-CM | POA: Diagnosis not present

## 2020-10-28 NOTE — Progress Notes (Signed)
HPI: FU CAD and atrial fibrillation. He previously lived in [place]; S/P coronary artery bypass and graft approximately 15 years ago. Also with history of paroxysmal atrial fibrillation/flutter and has had previous pacemaker.Since last seen he denies dyspnea, chest pain, palpitations or syncope.  Current Outpatient Medications  Medication Sig Dispense Refill   apixaban (ELIQUIS) 5 MG TABS tablet Take 1 tablet (5 mg total) by mouth 2 (two) times daily. 60 tablet 11   atorvastatin (LIPITOR) 20 MG tablet Take 1 tablet (20 mg total) by mouth daily. 90 tablet 3   B Complex-Biotin-FA (B-100 COMPLEX CR PO)      Coenzyme Q10 (CO Q 10) 100 MG CAPS Take by mouth daily.     diphenhydrAMINE (BENADRYL) 25 MG tablet Take 25 mg by mouth at bedtime as needed.     docusate sodium (COLACE) 100 MG capsule Take 100 mg by mouth daily.     furosemide (LASIX) 40 MG tablet Take 1 tablet (40 mg total) by mouth daily. 90 tablet 3   Melatonin 10 MG CAPS Take by mouth at bedtime.     Multiple Vitamin (MULTIVITAMIN) tablet Take 1 tablet by mouth daily.     Omega 3 1000 MG CAPS Take by mouth daily.     potassium chloride (KLOR-CON) 10 MEQ tablet Take 1 tablet (10 mEq total) by mouth daily. 90 tablet 3   tamsulosin (FLOMAX) 0.4 MG CAPS capsule Take 1 capsule (0.4 mg total) by mouth daily. 90 capsule 3   No current facility-administered medications for this visit.     Past Medical History:  Diagnosis Date   Atrial fibrillation ([facility])    CAD (coronary artery disease)    Hyperlipidemia    Hypertension    OSA (obstructive sleep apnea)    Pacemaker     Past Surgical History:  Procedure Laterality Date   CORONARY ARTERY BYPASS GRAFT     TONSILLECTOMY      Social History   Socioeconomic History   Marital status: Widowed    Spouse name: Not on file   Number of children: 3   Years of education: Not on file   Highest education level: Not on file  Occupational History   Not on  file  Tobacco Use   Smoking status: Former Smoker   Smokeless tobacco: Never Used  Substance and Sexual Activity   Alcohol use: Yes    Comment: Occasional   Drug use: Never   Sexual activity: Not on file  Other Topics Concern   Not on file  Social History Narrative   Not on file   Social Determinants of Health   Financial Resource Strain:    Difficulty of Paying Living Expenses: Not on file  Food Insecurity:    Worried About [place] in the Last Year: Not on file   Ran Out of Food in the Last Year: Not on file  Transportation Needs:    Lack of Transportation (Medical): Not on file   Lack of Transportation (Non-Medical): Not on file  Physical Activity:    Days of Exercise per Week: Not on file   Minutes of Exercise per Session: Not on file  Stress:    Feeling of Stress : Not on file  Social Connections:    Frequency of Communication with Friends and Family: Not on file   Frequency of Social Gatherings with Friends and Family: Not on file   Attends Religious Services: Not on file   Active Member of Clubs  or Organizations: Not on file   Attends Archivist Meetings: Not on file   Marital Status: Not on file  Intimate Partner Violence:    Fear of Current or Ex-Partner: Not on file   Emotionally Abused: Not on file   Physically Abused: Not on file   Sexually Abused: Not on file    Family History  Problem Relation Age of Onset   Breast cancer Mother    Cancer Mother    Heart failure Father    Healthy Sister    Diabetes Paternal Grandmother    Heart failure Paternal Grandfather     ROS: no fevers or chills, productive cough, hemoptysis, dysphasia, odynophagia, melena, hematochezia, dysuria, hematuria, rash, seizure activity, orthopnea, PND, pedal edema, claudication. Remaining systems are negative.  Physical Exam: Well-developed well-nourished in no acute distress.  Skin is warm and dry.  HEENT is normal.  Neck is  supple.  Chest is clear to auscultation with normal expansion.  Cardiovascular exam is regular rate and rhythm.  Abdominal exam nontender or distended. No masses palpated. Extremities show no edema. neuro grossly intact  A/P  1 coronary artery disease status post coronary artery bypass graft-patient denies chest pain.  Continue statin.  He is not on aspirin given need for apixaban.  Schedule echocardiogram to assess LV function.  Note we do not have records from [place] concerning his previous cardiac history.  We will continue to try and obtain.  2 permanent atrial fibrillation-continue apixaban.  3 hypertension-blood pressure controlled.  Continue present medications and follow.  4 hyperlipidemia-continue statin.  5 lower extremity edema-continue Lasix at present dose.  6 previous pacemaker-followed by electrophysiology.  Kirk Ruths, MD

## 2020-10-30 ENCOUNTER — Encounter: Payer: Self-pay | Admitting: Cardiology

## 2020-10-30 ENCOUNTER — Ambulatory Visit: Payer: Medicare Other | Admitting: Cardiology

## 2020-10-30 ENCOUNTER — Other Ambulatory Visit: Payer: Self-pay

## 2020-10-30 VITALS — BP 144/74 | HR 87 | Ht 70.0 in | Wt 234.0 lb

## 2020-10-30 DIAGNOSIS — E7800 Pure hypercholesterolemia, unspecified: Secondary | ICD-10-CM

## 2020-10-30 DIAGNOSIS — I10 Essential (primary) hypertension: Secondary | ICD-10-CM

## 2020-10-30 DIAGNOSIS — I4821 Permanent atrial fibrillation: Secondary | ICD-10-CM

## 2020-10-30 DIAGNOSIS — I2510 Atherosclerotic heart disease of native coronary artery without angina pectoris: Secondary | ICD-10-CM

## 2020-10-30 NOTE — Patient Instructions (Signed)
  Testing/Procedures:  Your physician has requested that you have an echocardiogram. Echocardiography is a painless test that uses sound waves to create images of your heart. It provides your doctor with information about the size and shape of your heart and how well your heart's chambers and valves are working. This procedure takes approximately one hour. There are no restrictions for this procedure.[facility] [place] =[address] ST FLOOR IMAGING DEPARTMENT     Follow-Up: At [facility], you and your health needs are our priority.  As part of our continuing mission to provide you with exceptional heart care, we have created designated Provider Care Teams.  These Care Teams include your primary Cardiologist (physician) and Advanced Practice Providers (APPs -  Physician Assistants and Nurse Practitioners) who all work together to provide you with the care you need, when you need it.  We recommend signing up for the patient portal called "MyChart".  Sign up information is provided on this After Visit Summary.  MyChart is used to connect with patients for Virtual Visits (Telemedicine).  Patients are able to view lab/test results, encounter notes, upcoming appointments, etc.  Non-urgent messages can be sent to your provider as well.   To learn more about what you can do with MyChart, go to [email or web address].    Your next appointment:   12 month(s)  The format for your next appointment:   In Person  Provider:   Kirk Ruths, MD

## 2020-11-11 ENCOUNTER — Encounter: Payer: Self-pay | Admitting: *Deleted

## 2020-11-27 ENCOUNTER — Ambulatory Visit (HOSPITAL_BASED_OUTPATIENT_CLINIC_OR_DEPARTMENT_OTHER)
Admission: RE | Admit: 2020-11-27 | Discharge: 2020-11-27 | Disposition: A | Discharge status: Home / Self Care | Payer: Medicare Other | Source: Ambulatory Visit | Attending: Cardiology | Admitting: Cardiology

## 2020-11-27 ENCOUNTER — Other Ambulatory Visit: Payer: Self-pay

## 2020-11-27 DIAGNOSIS — I2510 Atherosclerotic heart disease of native coronary artery without angina pectoris: Secondary | ICD-10-CM | POA: Diagnosis not present

## 2020-11-27 LAB — ECHOCARDIOGRAM COMPLETE
AR max vel: 3.3 cm2
AV Area VTI: 3.38 cm2
AV Area mean vel: 3.21 cm2
AV Mean grad: 3 mmHg
AV Peak grad: 5.2 mmHg
Ao pk vel: 1.14 m/s
Area-P 1/2: 2.93 cm2
S' Lateral: 3.68 cm

## 2020-11-27 MED ORDER — PERFLUTREN LIPID MICROSPHERE
1.0000 mL | INTRAVENOUS | Status: AC | PRN
  Administered 2020-12-26: 2 mL via INTRAVENOUS
  Filled 2020-11-27: qty 10

## 2020-12-04 ENCOUNTER — Telehealth: Payer: Self-pay | Admitting: Emergency Medicine

## 2020-12-04 ENCOUNTER — Encounter: Payer: Self-pay | Admitting: Family Medicine

## 2020-12-04 NOTE — Telephone Encounter (Signed)
Need remote transmission to establish 91 day remotes.

## 2020-12-05 ENCOUNTER — Encounter: Payer: Self-pay | Admitting: Family Medicine

## 2020-12-05 DIAGNOSIS — M25561 Pain in right knee: Secondary | ICD-10-CM

## 2020-12-06 ENCOUNTER — Ambulatory Visit: Payer: Self-pay

## 2020-12-06 ENCOUNTER — Other Ambulatory Visit: Payer: Self-pay

## 2020-12-10 ENCOUNTER — Other Ambulatory Visit: Payer: Self-pay

## 2020-12-10 ENCOUNTER — Encounter: Payer: Self-pay | Admitting: Cardiology

## 2020-12-10 ENCOUNTER — Ambulatory Visit: Payer: Medicare Other | Admitting: Cardiology

## 2020-12-10 ENCOUNTER — Ambulatory Visit (INDEPENDENT_AMBULATORY_CARE_PROVIDER_SITE_OTHER): Payer: Medicare Other

## 2020-12-10 VITALS — BP 136/70 | HR 88 | Ht 70.0 in | Wt 232.0 lb

## 2020-12-10 DIAGNOSIS — I4821 Permanent atrial fibrillation: Secondary | ICD-10-CM | POA: Diagnosis not present

## 2020-12-10 LAB — CUP PACEART REMOTE DEVICE CHECK
Battery Remaining Longevity: 96 mo
Battery Voltage: 3.04 V
Brady Statistic RV Percent Paced: 70.95 %
Date Time Interrogation Session: 20220111124900
Implantable Pulse Generator Implant Date: 20210128
Lead Channel Impedance Value: 550 Ohm
Lead Channel Pacing Threshold Amplitude: 0.375 V
Lead Channel Pacing Threshold Pulse Width: 0.24 ms
Lead Channel Sensing Intrinsic Amplitude: 9.338 mV
Lead Channel Setting Pacing Amplitude: 0.875
Lead Channel Setting Pacing Pulse Width: 0.24 ms
Lead Channel Setting Sensing Sensitivity: 2 mV

## 2020-12-10 NOTE — Progress Notes (Signed)
Electrophysiology Office Note   Date:  12/10/2020   ID:  Harry Kline, DOB 04/27/1934, MRN [id number]  PCP:  Eunice Blase, MD  Cardiologist:  Stanford Breed Primary Electrophysiologist:  Saavi Mceachron Meredith Leeds, MD    Chief Complaint: pacemaker   History of Present Illness: Harry Kline is a 87 y.o. male who is being seen today for the evaluation of pacemaker at the request of Kirk Ruths. Presenting today for electrophysiology evaluation.  He has a history of atrial fibrillation, coronary artery disease, hypertension, hyperlipidemia, OSA.  He has a Tourist information centre manager.  Approximately 15 years ago he had a CABG.  He moved from [place] to be closer to family.  Today, denies symptoms of palpitations, chest pain, shortness of breath, orthopnea, PND, lower extremity edema, claudication, dizziness, presyncope, syncope, bleeding, or neurologic sequela. The patient is tolerating medications without difficulties.  He overall feels well.  He has no chest pain or shortness of breath.  He is unaware of his atrial fibrillation.  He has been working out and has lost 6 pounds in the last 2 weeks.  He is doing the Silver sneakers program at [employer].  He is also eating less calories and cutting out carbs with a goal weight less than 200 pounds.   Past Medical History:  Diagnosis Date  . Atrial fibrillation ([place])   . CAD (coronary artery disease)   . Hyperlipidemia   . Hypertension   . OSA (obstructive sleep apnea)   . Pacemaker    Past Surgical History:  Procedure Laterality Date  . CORONARY ARTERY BYPASS GRAFT    . TONSILLECTOMY       Current Outpatient Medications  Medication Sig Dispense Refill  . apixaban (ELIQUIS) 5 MG TABS tablet Take 1 tablet (5 mg total) by mouth 2 (two) times daily. 60 tablet 11  . atorvastatin (LIPITOR) 20 MG tablet Take 1 tablet (20 mg total) by mouth daily. 90 tablet 3  . B Complex-Biotin-FA (B-100 COMPLEX CR PO)     . Coenzyme Q10 (CO Q 10) 100 MG CAPS Take  by mouth daily.    . diphenhydrAMINE (BENADRYL) 25 MG tablet Take 25 mg by mouth at bedtime as needed.    . docusate sodium (COLACE) 100 MG capsule Take 100 mg by mouth daily.    . furosemide (LASIX) 40 MG tablet Take 1 tablet (40 mg total) by mouth daily. 90 tablet 3  . Melatonin 10 MG CAPS Take by mouth at bedtime.    . Multiple Vitamin (MULTIVITAMIN) tablet Take 1 tablet by mouth daily.    . Omega 3 1000 MG CAPS Take by mouth daily.    . potassium chloride (KLOR-CON) 10 MEQ tablet Take 1 tablet (10 mEq total) by mouth daily. 90 tablet 3  . tamsulosin (FLOMAX) 0.4 MG CAPS capsule Take 1 capsule (0.4 mg total) by mouth daily. 90 capsule 3   No current facility-administered medications for this visit.    Allergies:   Patient has no known allergies.   Social History:  The patient  reports that he has quit smoking. He has never used smokeless tobacco. He reports current alcohol use. He reports that he does not use drugs.   Family History:  The patient's family history includes Breast cancer in his mother; Cancer in his mother; Diabetes in his paternal grandmother; Healthy in his sister; Heart failure in his father and paternal grandfather.    ROS:  Please see the history of present illness.   Otherwise, review of systems  is positive for none.   All other systems are reviewed and negative.   PHYSICAL EXAM: VS:  BP 136/70   Pulse 88   Ht 5\' 10"  (1.778 m)   Wt 232 lb (105.2 kg)   SpO2 98%   BMI 33.29 kg/m  , BMI Body mass index is 33.29 kg/m. GEN: Well nourished, well developed, in no acute distress  HEENT: normal  Neck: no JVD, carotid bruits, or masses Cardiac: RRR; no murmurs, rubs, or gallops,no edema  Respiratory:  clear to auscultation bilaterally, normal work of breathing GI: soft, nontender, nondistended, + BS MS: no deformity or atrophy  Skin: warm and dry, device site well healed Neuro:  Strength and sensation are intact Psych: euthymic mood, full affect  EKG:  EKG is  ordered today. Personal review of the ekg ordered shows atrial fibrillation, ventricular paced  Personal review of the device interrogation today. Results in [place]: 05/21/2020: ALT 20; TSH 2.25 09/09/2020: BUN 29; Creat 1.23; Hemoglobin 15.0; Platelets 155; Potassium 4.1; Sodium 142    Lipid Panel     Component Value Date/Time   CHOL 110 09/09/2020 0850   TRIG 92 09/09/2020 0850   HDL 41 09/09/2020 0850   CHOLHDL 2.7 09/09/2020 0850   LDLCALC 51 09/09/2020 0850     Wt Readings from Last 3 Encounters:  12/10/20 232 lb (105.2 kg)  10/30/20 234 lb (106.1 kg)  09/09/20 234 lb 12.8 oz (106.5 kg)      Other studies Reviewed: Additional studies/ records that were reviewed today include: TTE 11/27/20 1. TDS. Left ventricular ejection fraction, by estimation, is 55 to 60%.  The left ventricle has normal function. The left ventricle has no regional  wall motion abnormalities. Left ventricular diastolic parameters are  indeterminate.  2. Right ventricular systolic function is normal. The right ventricular  size is normal.  3. Left atrial size was severely dilated.  4. Right atrial size was mildly dilated.  5. The mitral valve is normal in structure. Mild mitral valve  regurgitation. No evidence of mitral stenosis.  6. The aortic valve is normal in structure. Aortic valve regurgitation is  not visualized. No aortic stenosis is present.  7. The inferior vena cava is normal in size with greater than 50%  respiratory variability, suggesting right atrial pressure of 3 mmHg.    ASSESSMENT AND PLAN:  1.  Permanent atrial fibrillation: Currently asymptomatic.  On Coumadin.  CHA2DS2-VASc of at least 4.  Is status post Medtronic Micra implanted for slow atrial fibrillation.  Device functioning appropriately.  No changes.    2.  Coronary artery disease status post CABG: Currently on Coumadin.  No current chest pain.  3.  Hyperlipidemia: Continue statin per primary  cardiology.  4. Hypertension: Currently well controlled    Current medicines are reviewed at length with the patient today.   The patient does not have concerns regarding his medicines.  The following changes were made today:  none  Labs/ tests ordered today include:  Orders Placed This Encounter  Procedures  . EKG 12-Lead     Disposition:   FU with Reef Achterberg 12 months  Signed, Hagan Vanauken Meredith Leeds, MD  12/10/2020 2:45 PM     [address] [place] [address] [phone] (office) [phone] (fax)

## 2020-12-10 NOTE — Telephone Encounter (Signed)
Assisted with remote transmission and transmission received.

## 2020-12-10 NOTE — Patient Instructions (Signed)
Medication Instructions:  Your physician recommends that you continue on your current medications as directed. Please refer to the Current Medication list given to you today.  *If you need a refill on your cardiac medications before your next appointment, please call your pharmacy*   Lab Work: None ordered If you have labs (blood work) drawn today and your tests are completely normal, you will receive your results only by: . MyChart Message (if you have MyChart) OR . A paper copy in the mail If you have any lab test that is abnormal or we need to change your treatment, we will call you to review the results.   Testing/Procedures: None ordered   Follow-Up: At [facility], you and your health needs are our priority.  As part of our continuing mission to provide you with exceptional heart care, we have created designated Provider Care Teams.  These Care Teams include your primary Cardiologist (physician) and Advanced Practice Providers (APPs -  Physician Assistants and Nurse Practitioners) who all work together to provide you with the care you need, when you need it.  We recommend signing up for the patient portal called "MyChart".  Sign up information is provided on this After Visit Summary.  MyChart is used to connect with patients for Virtual Visits (Telemedicine).  Patients are able to view lab/test results, encounter notes, upcoming appointments, etc.  Non-urgent messages can be sent to your provider as well.   To learn more about what you can do with MyChart, go to [email or web address].    Your next appointment:   1 year(s)  The format for your next appointment:   In Person  Provider:   Will Camnitz, MD   Thank you for choosing [facility]!!   Harry Anfinson, RN [phone]    Other Instructions    

## 2020-12-26 ENCOUNTER — Encounter: Payer: Self-pay | Admitting: Family Medicine

## 2020-12-26 NOTE — Addendum Note (Signed)
Encounter addended by: Adrian Prince, RDCS on: 12/26/2020 9:05 AM  Actions taken: MAR administration accepted

## 2020-12-27 NOTE — Progress Notes (Signed)
Remote pacemaker transmission.   

## 2020-12-31 ENCOUNTER — Ambulatory Visit: Payer: Medicare Other | Admitting: Family Medicine

## 2021-01-01 NOTE — Telephone Encounter (Signed)
Email was sent to Harry Kline with [facility] scheduling yesterday, they will check to see if pt pacemaker is compatible for MRI and then contact pt to schedule appt

## 2021-01-24 ENCOUNTER — Ambulatory Visit (HOSPITAL_COMMUNITY)
Admission: RE | Admit: 2021-01-24 | Discharge: 2021-01-24 | Disposition: A | Discharge status: Home / Self Care | Payer: Medicare Other | Source: Ambulatory Visit | Attending: Family Medicine | Admitting: Family Medicine

## 2021-01-24 ENCOUNTER — Other Ambulatory Visit: Payer: Self-pay

## 2021-01-24 DIAGNOSIS — M25561 Pain in right knee: Secondary | ICD-10-CM | POA: Diagnosis not present

## 2021-01-24 DIAGNOSIS — S83241A Other tear of medial meniscus, current injury, right knee, initial encounter: Secondary | ICD-10-CM | POA: Diagnosis not present

## 2021-01-24 DIAGNOSIS — M1711 Unilateral primary osteoarthritis, right knee: Secondary | ICD-10-CM | POA: Diagnosis not present

## 2021-01-24 DIAGNOSIS — R600 Localized edema: Secondary | ICD-10-CM | POA: Diagnosis not present

## 2021-01-24 DIAGNOSIS — M25461 Effusion, right knee: Secondary | ICD-10-CM | POA: Diagnosis not present

## 2021-01-24 NOTE — Progress Notes (Addendum)
Informed of MRI for today.   Device system confirmed to be MRI conditional, with implant date > 6 weeks ago and no evidence of abandoned or epicardial leads in review of most recent CXR  (Pt had Micra AV leadless PPM implanted at [facility] 12/2019) Interrogation from today reviewed, pt is currently VP at 91 bpm  RN confirms current HR in 60s. Change device settings for MRI to VOO at 80 bpm  Tachy-therapies to off if applicable.  Program device back to pre-MRI settings after completion of exam.  Harry Kline  01/24/2021 12:54 PM

## 2021-01-24 NOTE — Progress Notes (Signed)
Patient here today for MRI of right knee wo contrast. Patient has micra pacemaker. [employer] express sent to Mike-Medtronic rep and Andy-Cardiology PA. Orders received for VOO 80

## 2021-01-27 ENCOUNTER — Telehealth: Payer: Self-pay | Admitting: Family Medicine

## 2021-01-27 NOTE — Telephone Encounter (Signed)
Please schedule with Dr Marlou Sa.  Thanks.

## 2021-01-27 NOTE — Telephone Encounter (Signed)
MRI shows a substantial amount of arthritis in the medial compartment of the joint, and also extensive tearing of the medial meniscus cartilage.  Depending on symptoms, could contemplate surgical consult to discuss whether it's worth trying arthroscopic surgery, or whether you should consider knee replacement.

## 2021-02-04 ENCOUNTER — Encounter: Payer: Self-pay | Admitting: Family Medicine

## 2021-02-17 ENCOUNTER — Other Ambulatory Visit: Payer: Self-pay

## 2021-02-17 ENCOUNTER — Ambulatory Visit: Payer: Medicare Other | Admitting: Orthopedic Surgery

## 2021-02-17 DIAGNOSIS — M1711 Unilateral primary osteoarthritis, right knee: Secondary | ICD-10-CM

## 2021-02-18 ENCOUNTER — Encounter: Payer: Self-pay | Admitting: Orthopedic Surgery

## 2021-02-18 NOTE — Progress Notes (Signed)
Office Visit Note   Patient: Harry Kline           Date of Birth: 1934-07-07           MRN: [id number] Visit Date: 02/17/2021 Requested by: Eunice Blase, MD [place],  [place] [id number] PCP: Eunice Blase, MD  Subjective: Chief Complaint  Patient presents with  . Right Knee - Pain    HPI: Harry Kline is an 87 year old patient with right knee pain.  He has had pain on and off for 3 to 4 years but it has been worsening over the past 6 to 8 months.  Describes weakness giving way and some occasional quick sharp pains.  Had cortisone shot in June which was not helpful as well as gel injections 5 weeks later which was not helpful.  His pain became severe in January where he had to use a walker.  He has been off of the leg for the last several weeks.  Describes occasional giving way.  He does do better with rest.  He is here with his son.              ROS: All systems reviewed are negative as they relate to the chief complaint within the history of present illness.  Patient denies  fevers or chills.   Assessment & Plan: Visit Diagnoses:  1. Arthritis of right knee     Plan: Impression is right knee medial compartment arthritis with ACL deficiency and degenerative meniscal tearing.  The arthritis in the medial compartment on MRI reviewed with the patient does show essentially bone-on-bone changes.  Also some edema in the bone femoral and tibial side.  I do not think arthroscopic surgery will give her any predictable relief.  He is going to to try activity modification as well as quad strengthening to try to diminish his pain by 20% which would be about the max benefit I would expect him to get.  If that does not work then we could consider knee replacement but that would be a large undertaking for this patient.  Follow-Up Instructions: Return if symptoms worsen or fail to improve.   Orders:  No orders of the defined types were placed in this encounter.  No orders of the defined  types were placed in this encounter.     Procedures: No procedures performed   Clinical Data: No additional findings.  Objective: Vital Signs: There were no vitals taken for this visit.  Physical Exam:   Constitutional: Patient appears well-developed HEENT:  Head: Normocephalic Eyes:EOM are normal Neck: Normal range of motion Cardiovascular: Normal rate Pulmonary/chest: Effort normal Neurologic: Patient is alert Skin: Skin is warm Psychiatric: Patient has normal mood and affect    Ortho Exam: Ortho exam demonstrates no right knee effusion.  Has about a 7 degree flexion contracture but can bend it to 100 degrees.  Collaterals are stable.  Does have very mild ACL laxity compared to the left-hand side.  Pedal pulses palpable.  Ankle dorsiflexion intact.  Skin intact in the right knee region.  Extensor mechanism intact.  Specialty Comments:  No specialty comments available.  Imaging: No results found.   PMFS History: Patient Active Problem List   Diagnosis Date Noted  . History of squamous cell carcinoma 09/09/2020  . Pacemaker 02/19/2020  . History of lumbar fusion 02/19/2020  . Hyperlipidemia 02/19/2020  . BPH (benign prostatic hyperplasia) 02/19/2020  . Hypertension 02/19/2020  . Atrial flutter ([place]) 02/19/2020  . Paroxysmal atrial fibrillation ([place]) 02/19/2020  .  Mitral valve regurgitation 02/19/2020  . First degree AV block 02/19/2020  . Anxiety 02/19/2020  . Obesity (BMI 30.0-34.9) 02/19/2020  . Insomnia 02/19/2020   Past Medical History:  Diagnosis Date  . Atrial fibrillation ([place])   . CAD (coronary artery disease)   . Hyperlipidemia   . Hypertension   . OSA (obstructive sleep apnea)   . Pacemaker     Family History  Problem Relation Age of Onset  . Breast cancer Mother   . Cancer Mother   . Heart failure Father   . Healthy Sister   . Diabetes Paternal Grandmother   . Heart failure Paternal Grandfather     Past Surgical History:  Procedure  Laterality Date  . CORONARY ARTERY BYPASS GRAFT    . TONSILLECTOMY     Social History   Occupational History  . Not on file  Tobacco Use  . Smoking status: Former Research scientist (life sciences)  . Smokeless tobacco: Never Used  Substance and Sexual Activity  . Alcohol use: Yes    Comment: Occasional  . Drug use: Never  . Sexual activity: Not on file

## 2021-03-05 ENCOUNTER — Ambulatory Visit (INDEPENDENT_AMBULATORY_CARE_PROVIDER_SITE_OTHER): Payer: Medicare Other

## 2021-03-05 DIAGNOSIS — I480 Paroxysmal atrial fibrillation: Secondary | ICD-10-CM | POA: Diagnosis not present

## 2021-03-06 LAB — CUP PACEART REMOTE DEVICE CHECK
Battery Remaining Longevity: 96 mo
Battery Voltage: 3.03 V
Brady Statistic RV Percent Paced: 65.85 %
Date Time Interrogation Session: 20220407121400
Implantable Pulse Generator Implant Date: 20210128
Lead Channel Impedance Value: 530 Ohm
Lead Channel Pacing Threshold Amplitude: 0.5 V
Lead Channel Pacing Threshold Pulse Width: 0.24 ms
Lead Channel Sensing Intrinsic Amplitude: 7.65 mV
Lead Channel Setting Pacing Amplitude: 1 V
Lead Channel Setting Pacing Pulse Width: 0.24 ms
Lead Channel Setting Sensing Sensitivity: 2 mV

## 2021-03-11 ENCOUNTER — Ambulatory Visit (INDEPENDENT_AMBULATORY_CARE_PROVIDER_SITE_OTHER): Payer: Medicare Other

## 2021-03-11 DIAGNOSIS — I480 Paroxysmal atrial fibrillation: Secondary | ICD-10-CM

## 2021-03-12 LAB — CUP PACEART REMOTE DEVICE CHECK
Battery Remaining Longevity: 96 mo
Battery Voltage: 3.03 V
Brady Statistic RV Percent Paced: 66.65 %
Date Time Interrogation Session: 20220413105300
Implantable Pulse Generator Implant Date: 20210128
Lead Channel Impedance Value: 500 Ohm
Lead Channel Pacing Threshold Amplitude: 0.5 V
Lead Channel Pacing Threshold Pulse Width: 0.24 ms
Lead Channel Sensing Intrinsic Amplitude: 7.088 mV
Lead Channel Setting Pacing Amplitude: 1 V
Lead Channel Setting Pacing Pulse Width: 0.24 ms
Lead Channel Setting Sensing Sensitivity: 2 mV

## 2021-03-18 NOTE — Progress Notes (Signed)
Remote pacemaker transmission.   

## 2021-03-24 ENCOUNTER — Other Ambulatory Visit: Payer: Self-pay

## 2021-03-24 MED ORDER — APIXABAN 5 MG PO TABS: 5.0000 mg | ORAL_TABLET | Freq: Two times a day (BID) | ORAL | 1 refills | Status: DC

## 2021-03-24 NOTE — Telephone Encounter (Signed)
67m, 105.2kg, scr 1.23 09/09/20, lovw/camnitz 12/10/20

## 2021-03-26 NOTE — Progress Notes (Signed)
Remote pacemaker transmission.   

## 2021-03-29 ENCOUNTER — Encounter: Payer: Self-pay | Admitting: Family Medicine

## 2021-03-29 ENCOUNTER — Other Ambulatory Visit: Payer: Self-pay | Admitting: Family Medicine

## 2021-04-02 DIAGNOSIS — D485 Neoplasm of uncertain behavior of skin: Secondary | ICD-10-CM | POA: Diagnosis not present

## 2021-04-02 DIAGNOSIS — L300 Nummular dermatitis: Secondary | ICD-10-CM | POA: Diagnosis not present

## 2021-04-02 DIAGNOSIS — L821 Other seborrheic keratosis: Secondary | ICD-10-CM | POA: Diagnosis not present

## 2021-04-14 ENCOUNTER — Encounter: Payer: Self-pay | Admitting: Family Medicine

## 2021-04-27 ENCOUNTER — Encounter: Payer: Self-pay | Admitting: Family Medicine

## 2021-05-22 DIAGNOSIS — H01024 Squamous blepharitis left upper eyelid: Secondary | ICD-10-CM | POA: Diagnosis not present

## 2021-05-22 DIAGNOSIS — H35033 Hypertensive retinopathy, bilateral: Secondary | ICD-10-CM | POA: Diagnosis not present

## 2021-05-22 DIAGNOSIS — H35372 Puckering of macula, left eye: Secondary | ICD-10-CM | POA: Diagnosis not present

## 2021-05-22 DIAGNOSIS — H01021 Squamous blepharitis right upper eyelid: Secondary | ICD-10-CM | POA: Diagnosis not present

## 2021-05-23 ENCOUNTER — Encounter: Payer: Self-pay | Admitting: Family Medicine

## 2021-06-10 ENCOUNTER — Ambulatory Visit (INDEPENDENT_AMBULATORY_CARE_PROVIDER_SITE_OTHER): Payer: Medicare Other

## 2021-06-10 DIAGNOSIS — I4821 Permanent atrial fibrillation: Secondary | ICD-10-CM | POA: Diagnosis not present

## 2021-06-12 LAB — CUP PACEART REMOTE DEVICE CHECK
Battery Remaining Longevity: 96 mo
Battery Voltage: 3.02 V
Brady Statistic RV Percent Paced: 71.15 %
Date Time Interrogation Session: 20220713193700
Implantable Pulse Generator Implant Date: 20210128
Lead Channel Impedance Value: 520 Ohm
Lead Channel Pacing Threshold Amplitude: 0.5 V
Lead Channel Pacing Threshold Pulse Width: 0.24 ms
Lead Channel Sensing Intrinsic Amplitude: 8.888 mV
Lead Channel Setting Pacing Amplitude: 1 V
Lead Channel Setting Pacing Pulse Width: 0.24 ms
Lead Channel Setting Sensing Sensitivity: 2 mV

## 2021-06-25 ENCOUNTER — Other Ambulatory Visit: Payer: Self-pay | Admitting: Family Medicine

## 2021-06-26 ENCOUNTER — Encounter: Payer: Self-pay | Admitting: Family Medicine

## 2021-07-03 NOTE — Progress Notes (Signed)
Remote pacemaker transmission.   

## 2021-07-10 ENCOUNTER — Ambulatory Visit (INDEPENDENT_AMBULATORY_CARE_PROVIDER_SITE_OTHER): Payer: Medicare Other | Admitting: Family Medicine

## 2021-07-10 ENCOUNTER — Encounter: Payer: Self-pay | Admitting: Family Medicine

## 2021-07-10 DIAGNOSIS — E7800 Pure hypercholesterolemia, unspecified: Secondary | ICD-10-CM | POA: Diagnosis not present

## 2021-07-10 DIAGNOSIS — I10 Essential (primary) hypertension: Secondary | ICD-10-CM | POA: Diagnosis not present

## 2021-07-10 DIAGNOSIS — I480 Paroxysmal atrial fibrillation: Secondary | ICD-10-CM

## 2021-07-10 DIAGNOSIS — N400 Enlarged prostate without lower urinary tract symptoms: Secondary | ICD-10-CM | POA: Diagnosis not present

## 2021-07-10 NOTE — Patient Instructions (Signed)
Great to meet you today! Let me know when you need medications.

## 2021-07-10 NOTE — Assessment & Plan Note (Signed)
He is tolerating rosuvastatin well.  Recommend continuation.  We will plan to update labs at follow-up visit.

## 2021-07-10 NOTE — Assessment & Plan Note (Signed)
Stable symptoms with Flomax.

## 2021-07-10 NOTE — Assessment & Plan Note (Signed)
Management per cardiology.  No symptoms related to this.  He remains anticoagulated with Eliquis.

## 2021-07-10 NOTE — Progress Notes (Signed)
Harry Kline - 87 y.o. male MRN [id number]  Date of birth: 05/02/1934  Subjective Chief Complaint  Patient presents with   Establish Care    HPI Harry Kline is an 87 year old male here today for initial visit to establish care.  He reports that his previous PCP is moving into direct primary care and will not be taking his insurance.  He has a history of paroxysmal atrial fibrillation, BPH, hypertension, hyperlipidemia.  He does see cardiology for history of atrial fibrillation and hypertension.  He also has an electrophysiologist for management of his pacemaker.  He has no symptoms related to his atrial fibrillation.  He is anticoagulated with Eliquis.  He is exercising regularly with Silver sneakers program through the [facility].  BPH symptoms remain well controlled with tamsulosin.  No side effects including hypotension with this.  He is tolerating atorvastatin well for management of hyperlipidemia.  He denies side effects including myalgias.  No additional concerns at this time.  ROS:  A comprehensive ROS was completed and negative except as noted per HPI  No Known Allergies  Past Medical History:  Diagnosis Date   Atrial fibrillation ([facility])    Atrial fibrillation ([facility])    CAD (coronary artery disease)    Hyperlipidemia    Hypertension    OSA (obstructive sleep apnea)    Pacemaker    Skin cancer     Past Surgical History:  Procedure Laterality Date   CORONARY ARTERY BYPASS GRAFT     TONSILLECTOMY      Social History   Socioeconomic History   Marital status: Widowed    Spouse name: Not on file   Number of children: 3   Years of education: Not on file   Highest education level: Not on file  Occupational History   Not on file  Tobacco Use   Smoking status: Former   Smokeless tobacco: Never  Substance and Sexual Activity   Alcohol use: Yes    Comment: Occasional   Drug use: Never   Sexual activity: Not Currently  Other Topics Concern   Not on file  Social History  Narrative   Not on file   Social Determinants of Health   Financial Resource Strain: Not on file  Food Insecurity: Not on file  Transportation Needs: Not on file  Physical Activity: Not on file  Stress: Not on file  Social Connections: Not on file    Family History  Problem Relation Age of Onset   Breast cancer Mother    Cancer Mother    Heart failure Father    Healthy Sister    Diabetes Paternal Grandmother    Heart failure Paternal Grandfather     Health Maintenance  Topic Date Due   Zoster Vaccines- Shingrix (1 of 2) Never done   PNA vac Low Risk Adult (1 of 2 - PCV13) Never done   COVID-19 Vaccine (4 - Booster for Moderna series) 04/01/2021   INFLUENZA VACCINE  06/30/2021   TETANUS/TDAP  02/06/2030   HPV VACCINES  Aged Out     ----------------------------------------------------------------------------------------------------------------------------------------------------------------------------------------------------------------- Physical Exam BP 126/72 (BP Location: Left Arm, Patient Position: Sitting, Cuff Size: Normal)   Pulse 80   Temp 98.3 F (36.8 C)   Ht 5' 9.5" (1.765 m)   Wt 231 lb (104.8 kg)   SpO2 100%   BMI 33.62 kg/m   Physical Exam Constitutional:      Appearance: Normal appearance.  Eyes:     General: No scleral icterus. Cardiovascular:     Rate  and Rhythm: Normal rate. Rhythm irregular.  Pulmonary:     Effort: Pulmonary effort is normal.     Breath sounds: Normal breath sounds.  Musculoskeletal:     Cervical back: Neck supple.  Skin:    General: Skin is warm and dry.  Neurological:     Mental Status: He is alert.  Psychiatric:        Mood and Affect: Mood normal.        Behavior: Behavior normal.    ------------------------------------------------------------------------------------------------------------------------------------------------------------------------------------------------------------------- Assessment and  Plan  Hypertension Blood pressure is well controlled at this time.  Recommend continued exercise for weight loss as well as low-sodium diet.  Paroxysmal atrial fibrillation (HCC) Management per cardiology.  No symptoms related to this.  He remains anticoagulated with Eliquis.  BPH (benign prostatic hyperplasia) Stable symptoms with Flomax.  Hyperlipidemia He is tolerating rosuvastatin well.  Recommend continuation.  We will plan to update labs at follow-up visit.   No orders of the defined types were placed in this encounter.   Return in about 6 months (around 01/10/2022) for HTN.    This visit occurred during the SARS-CoV-2 public health emergency.  Safety protocols were in place, including screening questions prior to the visit, additional usage of staff PPE, and extensive cleaning of exam room while observing appropriate contact time as indicated for disinfecting solutions.

## 2021-07-10 NOTE — Assessment & Plan Note (Signed)
Blood pressure is well controlled at this time.  Recommend continued exercise for weight loss as well as low-sodium diet.

## 2021-09-03 ENCOUNTER — Encounter: Payer: Self-pay | Admitting: Family Medicine

## 2021-09-08 ENCOUNTER — Other Ambulatory Visit: Payer: Self-pay

## 2021-09-08 ENCOUNTER — Encounter: Payer: Self-pay | Admitting: Family Medicine

## 2021-09-08 DIAGNOSIS — Z85828 Personal history of other malignant neoplasm of skin: Secondary | ICD-10-CM | POA: Diagnosis not present

## 2021-09-08 DIAGNOSIS — L570 Actinic keratosis: Secondary | ICD-10-CM | POA: Diagnosis not present

## 2021-09-08 DIAGNOSIS — L821 Other seborrheic keratosis: Secondary | ICD-10-CM | POA: Diagnosis not present

## 2021-09-08 DIAGNOSIS — D1801 Hemangioma of skin and subcutaneous tissue: Secondary | ICD-10-CM | POA: Diagnosis not present

## 2021-09-08 MED ORDER — APIXABAN 5 MG PO TABS: 5.0000 mg | ORAL_TABLET | Freq: Two times a day (BID) | ORAL | 1 refills | Status: DC

## 2021-09-08 NOTE — Telephone Encounter (Signed)
Eliquis 5 mg refill request received. Patient is 87 years old, weight-104.8 kg, Crea- 1.23 on 09/09/20, Diagnosis-PAF, and last seen by Dr. Curt Bears on 12/10/20. Dose is appropriate based on dosing criteria. Will send in refill to requested pharmacy.

## 2021-09-09 ENCOUNTER — Ambulatory Visit (INDEPENDENT_AMBULATORY_CARE_PROVIDER_SITE_OTHER): Payer: Medicare Other

## 2021-09-09 ENCOUNTER — Other Ambulatory Visit: Payer: Self-pay | Admitting: Family Medicine

## 2021-09-09 DIAGNOSIS — I480 Paroxysmal atrial fibrillation: Secondary | ICD-10-CM | POA: Diagnosis not present

## 2021-09-09 DIAGNOSIS — Z9989 Dependence on other enabling machines and devices: Secondary | ICD-10-CM

## 2021-09-09 DIAGNOSIS — G4733 Obstructive sleep apnea (adult) (pediatric): Secondary | ICD-10-CM

## 2021-09-09 LAB — CUP PACEART REMOTE DEVICE CHECK
Battery Remaining Longevity: 96 mo
Battery Voltage: 3.02 V
Brady Statistic RV Percent Paced: 73.22 %
Date Time Interrogation Session: 20221011111400
Implantable Pulse Generator Implant Date: 20210128
Lead Channel Impedance Value: 540 Ohm
Lead Channel Pacing Threshold Amplitude: 0.5 V
Lead Channel Pacing Threshold Pulse Width: 0.24 ms
Lead Channel Sensing Intrinsic Amplitude: 7.538 mV
Lead Channel Setting Pacing Amplitude: 1 V
Lead Channel Setting Pacing Pulse Width: 0.24 ms
Lead Channel Setting Sensing Sensitivity: 2 mV

## 2021-09-12 ENCOUNTER — Ambulatory Visit (INDEPENDENT_AMBULATORY_CARE_PROVIDER_SITE_OTHER): Payer: Medicare Other | Admitting: Family Medicine

## 2021-09-12 VITALS — BP 134/53 | HR 77

## 2021-09-12 DIAGNOSIS — Z23 Encounter for immunization: Secondary | ICD-10-CM | POA: Diagnosis not present

## 2021-09-12 NOTE — Progress Notes (Signed)
Medical screening examination/treatment was performed by qualified clinical staff member and as supervising physician I was immediately available for consultation/collaboration. I have reviewed documentation and agree with assessment and plan.  Nowell Sites, DO  

## 2021-09-12 NOTE — Progress Notes (Signed)
Patient is here for a flu vaccine. Verified no previous allergy to flu vaccine, eggs, or latex. Flu injection to left deltoid with no apparent complications. Patient advised to call with any problems.   

## 2021-09-18 NOTE — Progress Notes (Signed)
Remote pacemaker transmission.   

## 2021-09-22 DIAGNOSIS — G4733 Obstructive sleep apnea (adult) (pediatric): Secondary | ICD-10-CM | POA: Diagnosis not present

## 2021-09-22 DIAGNOSIS — R0683 Snoring: Secondary | ICD-10-CM | POA: Diagnosis not present

## 2021-09-22 DIAGNOSIS — G4710 Hypersomnia, unspecified: Secondary | ICD-10-CM | POA: Diagnosis not present

## 2021-09-22 DIAGNOSIS — Z9989 Dependence on other enabling machines and devices: Secondary | ICD-10-CM | POA: Diagnosis not present

## 2021-10-16 ENCOUNTER — Telehealth: Payer: Self-pay | Admitting: *Deleted

## 2021-10-16 NOTE — Telephone Encounter (Signed)
Patient assistance application for eliquis faxed to the company  

## 2021-11-13 DIAGNOSIS — R0683 Snoring: Secondary | ICD-10-CM | POA: Diagnosis not present

## 2021-11-13 DIAGNOSIS — G4733 Obstructive sleep apnea (adult) (pediatric): Secondary | ICD-10-CM | POA: Diagnosis not present

## 2021-11-26 DIAGNOSIS — G4710 Hypersomnia, unspecified: Secondary | ICD-10-CM | POA: Diagnosis not present

## 2021-11-26 DIAGNOSIS — G4733 Obstructive sleep apnea (adult) (pediatric): Secondary | ICD-10-CM | POA: Diagnosis not present

## 2021-11-26 DIAGNOSIS — Z9989 Dependence on other enabling machines and devices: Secondary | ICD-10-CM | POA: Diagnosis not present

## 2021-11-26 DIAGNOSIS — R0683 Snoring: Secondary | ICD-10-CM | POA: Diagnosis not present

## 2021-12-09 ENCOUNTER — Ambulatory Visit (INDEPENDENT_AMBULATORY_CARE_PROVIDER_SITE_OTHER): Payer: Medicare Other

## 2021-12-09 DIAGNOSIS — I480 Paroxysmal atrial fibrillation: Secondary | ICD-10-CM

## 2021-12-10 LAB — CUP PACEART REMOTE DEVICE CHECK
Battery Remaining Longevity: 96 mo
Battery Voltage: 3.02 V
Brady Statistic RV Percent Paced: 73.62 %
Date Time Interrogation Session: 20230111122400
Implantable Pulse Generator Implant Date: 20210128
Lead Channel Impedance Value: 580 Ohm
Lead Channel Pacing Threshold Amplitude: 0.5 V
Lead Channel Pacing Threshold Pulse Width: 0.24 ms
Lead Channel Sensing Intrinsic Amplitude: 8.55 mV
Lead Channel Setting Pacing Amplitude: 1 V
Lead Channel Setting Pacing Pulse Width: 0.24 ms
Lead Channel Setting Sensing Sensitivity: 2 mV

## 2021-12-18 NOTE — Telephone Encounter (Signed)
Patient has been denied patient assistance because household income is over their current eligibility criteria.

## 2021-12-19 ENCOUNTER — Other Ambulatory Visit: Payer: Self-pay

## 2021-12-19 MED ORDER — FUROSEMIDE 40 MG PO TABS: 40.0000 mg | ORAL_TABLET | Freq: Every day | ORAL | 0 refills | Status: DC

## 2021-12-19 MED ORDER — POTASSIUM CHLORIDE ER 10 MEQ PO TBCR: 10.0000 meq | EXTENDED_RELEASE_TABLET | Freq: Every day | ORAL | 0 refills | Status: DC

## 2021-12-19 MED ORDER — TAMSULOSIN HCL 0.4 MG PO CAPS: 0.4000 mg | ORAL_CAPSULE | Freq: Every day | ORAL | 0 refills | Status: DC

## 2021-12-19 MED ORDER — ATORVASTATIN CALCIUM 20 MG PO TABS: 20.0000 mg | ORAL_TABLET | Freq: Every day | ORAL | 0 refills | Status: DC

## 2021-12-19 NOTE — Progress Notes (Signed)
Remote pacemaker transmission.   

## 2022-01-12 ENCOUNTER — Ambulatory Visit: Payer: Medicare Other | Admitting: Family Medicine

## 2022-01-14 ENCOUNTER — Ambulatory Visit (INDEPENDENT_AMBULATORY_CARE_PROVIDER_SITE_OTHER): Payer: Medicare Other | Admitting: Family Medicine

## 2022-01-14 DIAGNOSIS — Z0000 Encounter for general adult medical examination without abnormal findings: Secondary | ICD-10-CM | POA: Diagnosis not present

## 2022-01-14 NOTE — Patient Instructions (Addendum)
[place] Maintenance Summary and Written Plan of Care  Harry Kline ,  Thank you for allowing me to perform your Medicare Annual Wellness Visit and for your ongoing commitment to your health.   Health Maintenance & Immunization History Health Maintenance  Topic Date Due   Zoster Vaccines- Shingrix (1 of 2) 04/13/2022 (Originally 11/20/1986)   Pneumonia Vaccine 25+ Years old (1 - PCV) 01/14/2023 (Originally 11/20/2001)   TETANUS/TDAP  02/06/2030   INFLUENZA VACCINE  Completed   COVID-19 Vaccine  Completed   HPV VACCINES  Aged Out   Immunization History  Administered Date(s) Administered   DTaP 02/07/2020   Fluad Quad(high Dose 65+) 09/12/2021   Influenza-Unspecified 08/15/2019, 10/16/2020   Moderna Sars-Covid-2 Vaccination 12/22/2019, 01/21/2020, 12/02/2020   Pfizer Covid-19 Vaccine Bivalent Booster 5y-11y 10/12/2021   Tdap 02/07/2020    These are the patient goals that we discussed:  Goals Addressed              This Visit's Progress     Patient Stated (pt-stated)        Would like to loose 20 lbs         This is a list of Health Maintenance Items that are overdue or due now: Pneumococcal vaccine  Shingrix - would like to discuss with Dr. Zigmund Daniel.  Orders/Referrals Placed Today: No orders of the defined types were placed in this encounter.  (Contact our referral department at [phone] if you have not spoken with someone about your referral appointment within the next 5 days)    Follow-up Plan Follow-up with Luetta Nutting, DO as planned Discuss your shingles vaccine and Prevnar 20 with Dr. Zigmund Daniel. Medicare wellness visit in one year. Patient will access AVS on my chart.      Health Maintenance, Male Adopting a healthy lifestyle and getting preventive care are important in promoting health and wellness. Ask your health care provider about: The right schedule for you to have regular tests and exams. Things  you can do on your own to prevent diseases and keep yourself healthy. What should I know about diet, weight, and exercise? Eat a healthy diet  Eat a diet that includes plenty of vegetables, fruits, low-fat dairy products, and lean protein. Do not eat a lot of foods that are high in solid fats, added sugars, or sodium. Maintain a healthy weight Body mass index (BMI) is a measurement that can be used to identify possible weight problems. It estimates body fat based on height and weight. Your health care provider can help determine your BMI and help you achieve or maintain a healthy weight. Get regular exercise Get regular exercise. This is one of the most important things you can do for your health. Most adults should: Exercise for at least 150 minutes each week. The exercise should increase your heart rate and make you sweat (moderate-intensity exercise). Do strengthening exercises at least twice a week. This is in addition to the moderate-intensity exercise. Spend less time sitting. Even light physical activity can be beneficial. Watch cholesterol and blood lipids Have your blood tested for lipids and cholesterol at 86 years of age, then have this test every 5 years. You may need to have your cholesterol levels checked more often if: Your lipid or cholesterol levels are high. You are older than 86 years of age. You are at high risk for heart disease. What should I know about cancer screening? Many types of cancers can be detected early and may often be prevented. Depending  on your health history and family history, you may need to have cancer screening at various ages. This may include screening for: Colorectal cancer. Prostate cancer. Skin cancer. Lung cancer. What should I know about heart disease, diabetes, and high blood pressure? Blood pressure and heart disease High blood pressure causes heart disease and increases the risk of stroke. This is more likely to develop in people who have  high blood pressure readings or are overweight. Talk with your health care provider about your target blood pressure readings. Have your blood pressure checked: Every 3-5 years if you are 45-82 years of age. Every year if you are 69 years old or older. If you are between the ages of 75 and 39 and are a current or former smoker, ask your health care provider if you should have a one-time screening for abdominal aortic aneurysm (AAA). Diabetes Have regular diabetes screenings. This checks your fasting blood sugar level. Have the screening done: Once every three years after age 44 if you are at a normal weight and have a low risk for diabetes. More often and at a younger age if you are overweight or have a high risk for diabetes. What should I know about preventing infection? Hepatitis B If you have a higher risk for hepatitis B, you should be screened for this virus. Talk with your health care provider to find out if you are at risk for hepatitis B infection. Hepatitis C Blood testing is recommended for: Everyone born from [birth year] through 1963. Anyone with known risk factors for hepatitis C. Sexually transmitted infections (STIs) You should be screened each year for STIs, including gonorrhea and chlamydia, if: You are sexually active and are younger than 86 years of age. You are older than 86 years of age and your health care provider tells you that you are at risk for this type of infection. Your sexual activity has changed since you were last screened, and you are at increased risk for chlamydia or gonorrhea. Ask your health care provider if you are at risk. Ask your health care provider about whether you are at high risk for HIV. Your health care provider may recommend a prescription medicine to help prevent HIV infection. If you choose to take medicine to prevent HIV, you should first get tested for HIV. You should then be tested every 3 months for as long as you are taking the medicine. Follow  these instructions at home: Alcohol use Do not drink alcohol if your health care provider tells you not to drink. If you drink alcohol: Limit how much you have to 0-2 drinks a day. Know how much alcohol is in your drink. In the [place] one drink equals one 12 oz bottle of beer (355 mL), one 5 oz glass of wine (148 mL), or one 1 oz glass of hard liquor (44 mL). Lifestyle Do not use any products that contain nicotine or tobacco. These products include cigarettes, chewing tobacco, and vaping devices, such as e-cigarettes. If you need help quitting, ask your health care provider. Do not use street drugs. Do not share needles. Ask your health care provider for help if you need support or information about quitting drugs. General instructions Schedule regular health, dental, and eye exams. Stay current with your vaccines. Tell your health care provider if: You often feel depressed. You have ever been abused or do not feel safe at home. Summary Adopting a healthy lifestyle and getting preventive care are important in promoting health and wellness. Follow your  health care provider's instructions about healthy diet, exercising, and getting tested or screened for diseases. Follow your health care provider's instructions on monitoring your cholesterol and blood pressure. This information is not intended to replace advice given to you by your health care provider. Make sure you discuss any questions you have with your health care provider. Document Revised: 04/07/2021 Document Reviewed: 04/07/2021 Elsevier Patient Education  [employer].

## 2022-01-14 NOTE — Progress Notes (Signed)
MEDICARE ANNUAL WELLNESS VISIT  01/14/2022  Telephone Visit Disclaimer This Medicare AWV was conducted by telephone due to national recommendations for restrictions regarding the COVID-19 Pandemic (e.g. social distancing).  I verified, using two identifiers, that I am speaking with Harry Kline or their authorized healthcare agent. I discussed the limitations, risks, security, and privacy concerns of performing an evaluation and management service by telephone and the potential availability of an in-person appointment in the future. The patient expressed understanding and agreed to proceed.  Location of Patient: Home Location of Provider (nurse):  In the office.  Subjective:    Harry Kline is a 88 y.o. male patient of Luetta Nutting, DO who had a Medicare Annual Wellness Visit today via telephone. Harry Kline is Retired and lives alone. he has 3 children. he reports that he is socially active and does interact with friends/family regularly. he is moderately physically active and enjoys travelling and puzzles.  Patient Care Team: Luetta Nutting, DO as PCP - General (Family Medicine) Stanford Breed Denice Bors, MD as Consulting Physician (Cardiology) Orbie Hurst, MD as Referring Physician (Dermatology)  Advanced Directives 01/14/2022 07/10/2021 07/10/2021  Does Patient Have a Medical Advance Directive? Yes Yes Yes  Type of Advance Directive Living will;Healthcare Power of [place];Living will;Out of facility DNR (pink MOST or yellow form) -  Does patient want to make changes to medical advance directive? No - Patient declined - -  Copy of [place] in Chart? No - copy requested No - copy requested [facility] Utilization Over the Past 12 Months: # of hospitalizations or ER visits: 0 # of surgeries: 0  Review of Systems    Patient reports that his overall health is unchanged compared to last year.  History obtained from chart review and the  patient  Patient Reported Readings (BP, Pulse, CBG, Weight, etc) none  Pain Assessment Pain : No/denies pain     Current Medications & Allergies (verified) Allergies as of 01/14/2022   No Known Allergies      Medication List        Accurate as of January 14, 2022  9:47 AM. If you have any questions, ask your nurse or doctor.          apixaban 5 MG Tabs tablet Commonly known as: ELIQUIS Take 1 tablet (5 mg total) by mouth 2 (two) times daily.   atorvastatin 20 MG tablet Commonly known as: LIPITOR Take 1 tablet (20 mg total) by mouth daily.   B-100 COMPLEX CR PO   Co Q 10 100 MG Caps Take by mouth daily.   diphenhydrAMINE 25 MG tablet Commonly known as: BENADRYL Take 25 mg by mouth at bedtime as needed.   docusate sodium 100 MG capsule Commonly known as: COLACE Take 100 mg by mouth daily.   furosemide 40 MG tablet Commonly known as: LASIX Take 1 tablet (40 mg total) by mouth daily.   Melatonin 10 MG Caps Take by mouth at bedtime.   multivitamin tablet Take 1 tablet by mouth daily.   naproxen sodium 220 MG tablet Commonly known as: ALEVE   Omega 3 1000 MG Caps Take by mouth daily.   potassium chloride 10 MEQ tablet Commonly known as: KLOR-CON Take 1 tablet (10 mEq total) by mouth daily.   tamsulosin 0.4 MG Caps capsule Commonly known as: FLOMAX Take 1 capsule (0.4 mg total) by mouth daily.   vitamin C 1000 MG tablet  History (reviewed): Past Medical History:  Diagnosis Date   Arthritis 2010   Diagnosed but not enough to treat with medication.   Atrial fibrillation ([place])    Atrial fibrillation ([facility])    CAD (coronary artery disease)    Cataract 2009   surgery both eyes   GERD (gastroesophageal reflux disease) 2000   Hyperlipidemia    Hypertension    OSA (obstructive sleep apnea)    Pacemaker    Skin cancer    Sleep apnea 1990   c-pap machine   Past Surgical History:  Procedure Laterality Date   CORONARY ARTERY BYPASS  GRAFT     EYE SURGERY  1985   RK surgery   SPINE SURGERY  2014   L4,L5 fusion   TONSILLECTOMY     Family History  Problem Relation Age of Onset   Breast cancer Mother    Cancer Mother    Heart failure Father    Heart disease Father    Healthy Sister    Diabetes Paternal Grandmother    Heart failure Paternal Grandfather    Heart disease Paternal Grandfather    Social History   Socioeconomic History   Marital status: Widowed    Spouse name: Not on file   Number of children: 3   Years of education: 16   Highest education level: Bachelor's degree (e.g., BA, AB, BS)  Occupational History   Occupation: Retired.  Tobacco Use   Smoking status: Former    Packs/day: 1.00    Years: 20.00    Pack years: 20.00    Types: Cigarettes    Quit date: 11/30/1973    Years since quitting: 48.1   Smokeless tobacco: Never  Substance and Sexual Activity   Alcohol use: Yes    Alcohol/week: 4.0 standard drinks    Types: 1 Glasses of wine, 2 Cans of beer, 1 Shots of liquor per week    Comment: Occasional   Drug use: Never   Sexual activity: Not Currently  Other Topics Concern   Not on file  Social History Narrative   Lives alone. He has three children. He enjoys traveling.   Social Determinants of Health   Financial Resource Strain: Low Risk    Difficulty of Paying Living Expenses: Not hard at all  Food Insecurity: No Food Insecurity   Worried About Charity fundraiser in the Last Year: Never true   [place] in the Last Year: Never true  Transportation Needs: No Transportation Needs   Lack of Transportation (Medical): No   Lack of Transportation (Non-Medical): No  Physical Activity: Sufficiently Active   Days of Exercise per Week: 5 days   Minutes of Exercise per Session: 90 min  Stress: No Stress Concern Present   Feeling of Stress : Not at all  Social Connections: Moderately Integrated   Frequency of Communication with Friends and Family: More than three times a week    Frequency of Social Gatherings with Friends and Family: More than three times a week   Attends Religious Services: More than 4 times per year   Active Member of [employer] or Organizations: Yes   Attends Archivist Meetings: More than 4 times per year   Marital Status: Widowed    Activities of Daily Living In your present state of health, do you have any difficulty performing the following activities: 01/14/2022 01/10/2022  Hearing? N N  Vision? N N  Difficulty concentrating or making decisions? N N  Walking or climbing stairs? N  N  Dressing or bathing? N N  Doing errands, shopping? N N  Preparing Food and eating ? N N  Using the Toilet? N N  In the past six months, have you accidently leaked urine? N N  Do you have problems with loss of bowel control? N N  Managing your Medications? N N  Managing your Finances? N N  Housekeeping or managing your Housekeeping? N N  Some recent data might be hidden    Patient Education/ Literacy How often do you need to have someone help you when you read instructions, pamphlets, or other written materials from your doctor or pharmacy?: 1 - Never What is the last grade level you completed in school?: Bachelor's degree  Exercise Current Exercise Habits: Home exercise routine;Structured exercise class, Type of exercise: walking;strength training/weights;Other - see comments ([employer] 2-3 times a week), Time (Minutes): > 60, Frequency (Times/Week): 5, Weekly Exercise (Minutes/Week): 0, Intensity: Moderate, Exercise limited by: None identified  Diet Patient reports consuming 2 meals a day and 0-1 snack(s) a day Patient reports that his primary diet is: Regular Patient reports that she does have regular access to food.   Depression Screen PHQ 2/9 Scores 01/14/2022 07/10/2021  PHQ - 2 Score 0 0  PHQ- 9 Score 0 1     Fall Risk Fall Risk  01/14/2022 01/10/2022  Falls in the past year? 0 0  Number falls in past yr: 0 0  Injury with Fall? 0 0  Risk  for fall due to : No Fall Risks -  Follow up Falls evaluation completed -     Objective:  Harry Kline seemed alert and oriented and he participated appropriately during our telephone visit.  Blood Pressure Weight BMI  BP Readings from Last 3 Encounters:  09/12/21 (!) 134/53  07/10/21 126/72  12/10/20 136/70   Wt Readings from Last 3 Encounters:  07/10/21 231 lb (104.8 kg)  12/10/20 232 lb (105.2 kg)  10/30/20 234 lb (106.1 kg)   BMI Readings from Last 1 Encounters:  07/10/21 33.62 kg/m    *Unable to obtain current vital signs, weight, and BMI due to telephone visit type  Hearing/Vision  Harry Kline did not seem to have difficulty with hearing/understanding during the telephone conversation Reports that he has not had a formal eye exam by an eye care professional within the past year Reports that he has not had a formal hearing evaluation within the past year *Unable to fully assess hearing and vision during telephone visit type  Cognitive Function: 6CIT Screen 01/14/2022  What Year? 0 points  What month? 0 points  What time? 0 points  Count back from 20 0 points  Months in reverse 0 points  Repeat phrase 0 points  Total Score 0   (Normal:0-7, Significant for Dysfunction: >8)  Normal Cognitive Function Screening: Yes   Immunization & Health Maintenance Record Immunization History  Administered Date(s) Administered   DTaP 02/07/2020   Fluad Quad(high Dose 65+) 09/12/2021   Influenza-Unspecified 08/15/2019, 10/16/2020   Moderna Sars-Covid-2 Vaccination 12/22/2019, 01/21/2020, 12/02/2020   Pfizer Covid-19 Vaccine Bivalent Booster 5y-11y 10/12/2021   Tdap 02/07/2020    Health Maintenance  Topic Date Due   Zoster Vaccines- Shingrix (1 of 2) 04/13/2022 (Originally 11[birth year]20/1986)   Pneumonia Vaccine 64+ Years old (1 - PCV) 01/14/2023 (Originally 11/20/2001)   TETANUS/TDAP  02/06/2030   INFLUENZA VACCINE  Completed   COVID-19 Vaccine  Completed   HPV VACCINES  Aged Out        Assessment  This is a routine wellness examination for Harry Kline.  Health Maintenance: Due or Overdue There are no preventive care reminders to display for this patient.   Harry Kline does not need a referral for Community Assistance: Care Management:   no Social Work:    no Prescription Assistance:  no Nutrition/Diabetes Education:  no   Plan:  Personalized Goals  Goals Addressed               This Visit's Progress     Patient Stated (pt-stated)        Would like to loose 20 lbs        Personalized Health Maintenance & Screening Recommendations  Pneumococcal vaccine  Shingrix  - would like to discuss with Dr. Zigmund Daniel.  Lung Cancer Screening Recommended: no (Low Dose CT Chest recommended if Age 18-80 years, 30 pack-year currently smoking OR have quit w/in past 15 years) Hepatitis C Screening recommended: no HIV Screening recommended: no  Advanced Directives: Written information was not prepared per patient's request.  Referrals & Orders No orders of the defined types were placed in this encounter.   Follow-up Plan Follow-up with Luetta Nutting, DO as planned Discuss your shingles vaccine and Prevnar 20 with Dr. Zigmund Daniel. Medicare wellness visit in one year. Patient will access AVS on my chart.   I have personally reviewed and noted the following in the patients chart:   Medical and social history Use of alcohol, tobacco or illicit drugs  Current medications and supplements Functional ability and status Nutritional status Physical activity Advanced directives List of other physicians Hospitalizations, surgeries, and ER visits in previous 12 months Vitals Screenings to include cognitive, depression, and falls Referrals and appointments  In addition, I have reviewed and discussed with Harry Kline certain preventive protocols, quality metrics, and best practice recommendations. A written personalized care plan for preventive services as  well as general preventive health recommendations is available and can be mailed to the patient at his request.      Tinnie Gens, RN  01/14/2022

## 2022-01-20 ENCOUNTER — Encounter: Payer: Self-pay | Admitting: Family Medicine

## 2022-01-20 ENCOUNTER — Other Ambulatory Visit: Payer: Self-pay

## 2022-01-20 ENCOUNTER — Ambulatory Visit (INDEPENDENT_AMBULATORY_CARE_PROVIDER_SITE_OTHER): Payer: Medicare Other | Admitting: Family Medicine

## 2022-01-20 VITALS — BP 136/48 | HR 62 | Temp 96.9°F | Ht 69.5 in | Wt 228.0 lb

## 2022-01-20 DIAGNOSIS — N400 Enlarged prostate without lower urinary tract symptoms: Secondary | ICD-10-CM

## 2022-01-20 DIAGNOSIS — E7800 Pure hypercholesterolemia, unspecified: Secondary | ICD-10-CM | POA: Diagnosis not present

## 2022-01-20 DIAGNOSIS — Z23 Encounter for immunization: Secondary | ICD-10-CM | POA: Diagnosis not present

## 2022-01-20 DIAGNOSIS — I480 Paroxysmal atrial fibrillation: Secondary | ICD-10-CM

## 2022-01-20 DIAGNOSIS — I10 Essential (primary) hypertension: Secondary | ICD-10-CM

## 2022-01-20 LAB — CBC WITH DIFFERENTIAL/PLATELET
Absolute Monocytes: 545 cells/uL (ref 200–950)
Basophils Absolute: 38 cells/uL (ref 0–200)
Basophils Relative: 0.7 %
Eosinophils Absolute: 189 cells/uL (ref 15–500)
Eosinophils Relative: 3.5 %
HCT: 44.3 % (ref 38.5–50.0)
Hemoglobin: 14.8 g/dL (ref 13.2–17.1)
Lymphs Abs: 1593 cells/uL (ref 850–3900)
MCH: 34 pg — ABNORMAL HIGH (ref 27.0–33.0)
MCHC: 33.4 g/dL (ref 32.0–36.0)
MCV: 101.8 fL — ABNORMAL HIGH (ref 80.0–100.0)
MPV: 10.3 fL (ref 7.5–12.5)
Monocytes Relative: 10.1 %
Neutro Abs: 3035 cells/uL (ref 1500–7800)
Neutrophils Relative %: 56.2 %
Platelets: 169 10*3/uL (ref 140–400)
RBC: 4.35 10*6/uL (ref 4.20–5.80)
RDW: 12.6 % (ref 11.0–15.0)
Total Lymphocyte: 29.5 %
WBC: 5.4 10*3/uL (ref 3.8–10.8)

## 2022-01-20 LAB — LIPID PANEL W/REFLEX DIRECT LDL
Cholesterol: 112 mg/dL (ref <200)
HDL: 42 mg/dL (ref >=40)
LDL Cholesterol (Calc): 49 mg/dL (calc)
Non-HDL Cholesterol (Calc): 70 mg/dL (calc) (ref <130)
Total CHOL/HDL Ratio: 2.7 (calc) (ref <5.0)
Triglycerides: 133 mg/dL (ref <150)

## 2022-01-20 LAB — COMPLETE METABOLIC PANEL WITH GFR
AG Ratio: 1.8 (calc) (ref 1.0–2.5)
ALT: 21 U/L (ref 9–46)
AST: 23 U/L (ref 10–35)
Albumin: 4.3 g/dL (ref 3.6–5.1)
Alkaline phosphatase (APISO): 66 U/L (ref 35–144)
BUN: 19 mg/dL (ref 7–25)
CO2: 31 mmol/L (ref 20–32)
Calcium: 9.5 mg/dL (ref 8.6–10.3)
Chloride: 104 mmol/L (ref 98–110)
Creat: 1.17 mg/dL (ref 0.70–1.22)
Globulin: 2.4 g/dL (calc) (ref 1.9–3.7)
Glucose, Bld: 110 mg/dL (ref 65–139)
Potassium: 3.9 mmol/L (ref 3.5–5.3)
Sodium: 142 mmol/L (ref 135–146)
Total Bilirubin: 1.3 mg/dL — ABNORMAL HIGH (ref 0.2–1.2)
Total Protein: 6.7 g/dL (ref 6.1–8.1)
eGFR: 61 mL/min/{1.73_m2} (ref >=60)

## 2022-01-20 LAB — PSA: PSA: 0.5 ng/mL (ref <=4.00)

## 2022-01-20 NOTE — Assessment & Plan Note (Signed)
Stable symptoms with tamsulosin.  Update PSA.

## 2022-01-20 NOTE — Assessment & Plan Note (Signed)
Management per cardiology.  Stable at this time.  Continue Eliquis for anticoagulation.

## 2022-01-20 NOTE — Patient Instructions (Signed)
Great to see you! Continue current medications.  Have Shingles vaccine completed at your pharmacy.  See me again in 6 months.

## 2022-01-20 NOTE — Assessment & Plan Note (Signed)
Blood pressure stable at this time.  Low-sodium diet with continued exercise recommended.

## 2022-01-20 NOTE — Assessment & Plan Note (Signed)
Doing well with atorvastatin.  Update lipid panel today.

## 2022-01-20 NOTE — Progress Notes (Signed)
Harry Kline - 88 y.o. male MRN [id number]  Date of birth: 1934-02-03  Subjective Chief Complaint  Patient presents with   Hypertension    HPI Harry Kline is an 88 year old male here today for follow-up visit.  Reports he is doing well at this time.  Continue to see cardiology for management of paroxysmal atrial fibrillation.  Blood pressure has been stable with current medication.  He is due for updated blood work.  Tolerating atorvastatin well for management of hyperlipidemia.    BPH symptoms are stable with Flomax.  ROS:  A comprehensive ROS was completed and negative except as noted per HPI  No Known Allergies  Past Medical History:  Diagnosis Date   Arthritis 2010   Diagnosed but not enough to treat with medication.   Atrial fibrillation ([place])    Atrial fibrillation ([place])    CAD (coronary artery disease)    Cataract 2009   surgery both eyes   GERD (gastroesophageal reflux disease) 2000   Hyperlipidemia    Hypertension    OSA (obstructive sleep apnea)    Pacemaker    Skin cancer    Sleep apnea 1990   c-pap machine    Past Surgical History:  Procedure Laterality Date   CORONARY ARTERY [place]   RK surgery   SPINE SURGERY  2014   L4,L5 fusion   TONSILLECTOMY      Social History   Socioeconomic History   Marital status: Widowed    Spouse name: Not on file   Number of children: 3   Years of education: 16   Highest education level: Bachelor's degree (e.g., BA, AB, BS)  Occupational History   Occupation: Retired.  Tobacco Use   Smoking status: Former    Packs/day: 1.00    Years: 20.00    Pack years: 20.00    Types: Cigarettes    Quit date: 11/30/1973    Years since quitting: 48.1   Smokeless tobacco: Never  Substance and Sexual Activity   Alcohol use: Yes    Alcohol/week: 4.0 standard drinks    Types: 1 Glasses of wine, 2 Cans of beer, 1 Shots of liquor per week    Comment: Occasional   Drug use: Never   Sexual activity: Not  Currently  Other Topics Concern   Not on file  Social History Narrative   Lives alone. He has three children. He enjoys traveling.   Social Determinants of Health   Financial Resource Strain: Low Risk    Difficulty of Paying Living Expenses: Not hard at all  Food Insecurity: No Food Insecurity   Worried About Charity fundraiser in the Last Year: Never true   [place] in the Last Year: Never true  Transportation Needs: No Transportation Needs   Lack of Transportation (Medical): No   Lack of Transportation (Non-Medical): No  Physical Activity: Sufficiently Active   Days of Exercise per Week: 5 days   Minutes of Exercise per Session: 90 min  Stress: No Stress Concern Present   Feeling of Stress : Not at all  Social Connections: Moderately Integrated   Frequency of Communication with Friends and Family: More than three times a week   Frequency of Social Gatherings with Friends and Family: More than three times a week   Attends Religious Services: More than 4 times per year   Active Member of [employer] or Organizations: Yes   Attends Archivist Meetings: More than 4 times  per year   Marital Status: Widowed    Family History  Problem Relation Age of Onset   Breast cancer Mother    Cancer Mother    Heart failure Father    Heart disease Father    Healthy Sister    Diabetes Paternal Grandmother    Heart failure Paternal Grandfather    Heart disease Paternal Grandfather     Health Maintenance  Topic Date Due   Zoster Vaccines- Shingrix (1 of 2) 04/13/2022 (Originally 11/20/1986)   TETANUS/TDAP  02/06/2030   Pneumonia Vaccine 37+ Years old  Completed   INFLUENZA VACCINE  Completed   COVID-19 Vaccine  Completed   HPV VACCINES  Aged Out     ----------------------------------------------------------------------------------------------------------------------------------------------------------------------------------------------------------------- Physical  Exam BP (!) 136/48 (BP Location: Left Arm, Patient Position: Sitting, Cuff Size: Normal)    Pulse 62    Temp (!) 96.9 F (36.1 C)    Ht 5' 9.5" (1.765 m)    Wt 228 lb (103.4 kg)    SpO2 100%    BMI 33.19 kg/m   Physical Exam Constitutional:      Appearance: Normal appearance.  Eyes:     General: No scleral icterus. Cardiovascular:     Rate and Rhythm: Normal rate and regular rhythm.  Pulmonary:     Effort: Pulmonary effort is normal.     Breath sounds: Normal breath sounds.  Musculoskeletal:     Cervical back: Neck supple.  Neurological:     Mental Status: He is alert.  Psychiatric:        Mood and Affect: Mood normal.        Behavior: Behavior normal.    ------------------------------------------------------------------------------------------------------------------------------------------------------------------------------------------------------------------- Assessment and Plan  Paroxysmal atrial fibrillation ([place]) Management per cardiology.  Stable at this time.  Continue Eliquis for anticoagulation.  Hypertension Blood pressure stable at this time.  Low-sodium diet with continued exercise recommended.  BPH (benign prostatic hyperplasia) Stable symptoms with tamsulosin.  Update PSA.  Hyperlipidemia Doing well with atorvastatin.  Update lipid panel today.   No orders of the defined types were placed in this encounter.   Return in about 6 months (around 07/20/2022) for HTN/BPH.    This visit occurred during the SARS-CoV-2 public health emergency.  Safety protocols were in place, including screening questions prior to the visit, additional usage of staff PPE, and extensive cleaning of exam room while observing appropriate contact time as indicated for disinfecting solutions.

## 2022-01-26 ENCOUNTER — Encounter: Payer: Medicare Other | Admitting: Cardiology

## 2022-02-18 DIAGNOSIS — G4733 Obstructive sleep apnea (adult) (pediatric): Secondary | ICD-10-CM | POA: Diagnosis not present

## 2022-03-10 ENCOUNTER — Ambulatory Visit (INDEPENDENT_AMBULATORY_CARE_PROVIDER_SITE_OTHER): Payer: Medicare Other

## 2022-03-10 DIAGNOSIS — I480 Paroxysmal atrial fibrillation: Secondary | ICD-10-CM

## 2022-03-11 LAB — CUP PACEART REMOTE DEVICE CHECK
Battery Remaining Longevity: 96 mo
Battery Voltage: 3.02 V
Brady Statistic RV Percent Paced: 73.03 %
Date Time Interrogation Session: 20230411220000
Implantable Pulse Generator Implant Date: 20210128
Lead Channel Impedance Value: 520 Ohm
Lead Channel Pacing Threshold Amplitude: 0.5 V
Lead Channel Pacing Threshold Pulse Width: 0.24 ms
Lead Channel Sensing Intrinsic Amplitude: 6.525 mV
Lead Channel Setting Pacing Amplitude: 1 V
Lead Channel Setting Pacing Pulse Width: 0.24 ms
Lead Channel Setting Sensing Sensitivity: 2 mV

## 2022-03-16 ENCOUNTER — Ambulatory Visit: Payer: Medicare Other | Admitting: Cardiology

## 2022-03-16 ENCOUNTER — Encounter: Payer: Self-pay | Admitting: Cardiology

## 2022-03-16 VITALS — BP 154/80 | HR 73 | Ht 70.0 in | Wt 232.0 lb

## 2022-03-16 DIAGNOSIS — I4821 Permanent atrial fibrillation: Secondary | ICD-10-CM

## 2022-03-16 NOTE — Progress Notes (Signed)
? ?Electrophysiology Office Note ? ? ?Date:  03/16/2022  ? ?ID:  Harry Kline, DOB July 15, 1934, MRN [id number] ? ?PCP:  Luetta Nutting, DO  ?Cardiologist:  Stanford Breed ?Primary Electrophysiologist:  Ramzey Petrovic Meredith Leeds, MD   ? ?Chief Complaint: pacemaker ?  ?History of Present Illness: ?Harry Kline is a 88 y.o. male who is being seen today for the evaluation of pacemaker at the request of Harry Kline. Presenting today for electrophysiology evaluation. ? ?He has a history significant for atrial fibrillation, coronary artery disease, hypertension, hyperlipidemia, OSA.  He has a Tourist information centre manager.  Approximately 15 years ago he had a CABG.  He moved from [place] to be closer to family. ? ?Today, denies symptoms of palpitations, chest pain, shortness of breath, orthopnea, PND, lower extremity edema, claudication, dizziness, presyncope, syncope, bleeding, or neurologic sequela. The patient is tolerating medications without difficulties.  Currently feeling well.  He has no chest pain or shortness of breath.  He is able to do all of his daily activities.  He has been exercising.  He went on a cruise, and unfortunately gained weight but he is working on losing that weight.  He is overall happy with how he is feeling. ? ? ?Past Medical History:  ?Diagnosis Date  ? Arthritis 2010  ? Diagnosed but not enough to treat with medication.  ? Atrial fibrillation ([place])   ? Atrial fibrillation ([place])   ? CAD (coronary artery disease)   ? Cataract 2009  ? surgery both eyes  ? GERD (gastroesophageal reflux disease) 2000  ? Hyperlipidemia   ? Hypertension   ? OSA (obstructive sleep apnea)   ? Pacemaker   ? Skin cancer   ? Sleep apnea 1990  ? c-pap machine  ? ?Past Surgical History:  ?Procedure Laterality Date  ? CORONARY ARTERY BYPASS GRAFT    ? [place]  ? RK surgery  ? [place] SURGERY  2014  ? L4,L5 fusion  ? TONSILLECTOMY    ? ? ? ?Current Outpatient Medications  ?Medication Sig Dispense Refill  ? apixaban (ELIQUIS) 5 MG  TABS tablet Take 1 tablet (5 mg total) by mouth 2 (two) times daily. 180 tablet 1  ? Ascorbic Acid (VITAMIN C) 1000 MG tablet 1,000 mg daily.    ? atorvastatin (LIPITOR) 20 MG tablet Take 1 tablet (20 mg total) by mouth daily. 90 tablet 0  ? B Complex-Biotin-FA (B-100 COMPLEX CR PO)     ? Coenzyme Q10 (CO Q 10) 100 MG CAPS Take by mouth daily.    ? diphenhydrAMINE (BENADRYL) 25 MG tablet Take 25 mg by mouth at bedtime as needed.    ? docusate sodium (COLACE) 100 MG capsule Take 100 mg by mouth daily.    ? furosemide (LASIX) 40 MG tablet Take 1 tablet (40 mg total) by mouth daily. 90 tablet 0  ? Melatonin 10 MG CAPS Take by mouth at bedtime.    ? Multiple Vitamin (MULTIVITAMIN) tablet Take 1 tablet by mouth daily.    ? naproxen sodium (ALEVE) 220 MG tablet Take 220 mg by mouth daily as needed.    ? Omega 3 1000 MG CAPS Take by mouth daily.    ? potassium chloride (KLOR-CON) 10 MEQ tablet Take 1 tablet (10 mEq total) by mouth daily. 90 tablet 0  ? tamsulosin (FLOMAX) 0.4 MG CAPS capsule Take 1 capsule (0.4 mg total) by mouth daily. 90 capsule 0  ? ?No current facility-administered medications for this visit.  ? ? ?Allergies:  Patient has no known allergies.  ? ?Social History:  The patient  reports that he quit smoking about 48 years ago. His smoking use included cigarettes. He has a 20.00 pack-year smoking history. He has never used smokeless tobacco. He reports current alcohol use of about 4.0 standard drinks per week. He reports that he does not use drugs.  ? ?Family History:  The patient's family history includes Breast cancer in his mother; Cancer in his mother; Diabetes in his paternal grandmother; Healthy in his sister; Heart disease in his father and paternal grandfather; Heart failure in his father and paternal grandfather.  ? ? ?ROS:  Please see the history of present illness.   Otherwise, review of systems is positive for none.   All other systems are reviewed and negative.  ? ?PHYSICAL EXAM: ?VS:  BP (!)  154/80   Pulse 73   Ht '5\' 10"'$  (1.778 m)   Wt 232 lb (105.2 kg)   SpO2 98%   BMI 33.29 kg/m?  , BMI Body mass index is 33.29 kg/m?. ?GEN: Well nourished, well developed, in no acute distress  ?HEENT: normal  ?Neck: no JVD, carotid bruits, or masses ?Cardiac: RRR; no murmurs, rubs, or gallops,no edema  ?Respiratory:  clear to auscultation bilaterally, normal work of breathing ?GI: soft, nontender, nondistended, + BS ?MS: no deformity or atrophy  ?Skin: warm and dry ?Neuro:  Strength and sensation are intact ?Psych: euthymic mood, full affect ? ?EKG:  EKG is ordered today. ?Personal review of the ekg ordered shows atrial fibrillation, ventricular paced ? ?Personal review of the device interrogation today. Results in [place]  ? ? ?Recent Labs: ?01/20/2022: ALT 21; BUN 19; Creat 1.17; Hemoglobin 14.8; Platelets 169; Potassium 3.9; Sodium 142  ? ? ?Lipid Panel  ?   ?Component Value Date/Time  ? CHOL 112 01/20/2022 0000  ? TRIG 133 01/20/2022 0000  ? HDL 42 01/20/2022 0000  ? CHOLHDL 2.7 01/20/2022 0000  ? [place] 49 01/20/2022 0000  ? ? ? ?Wt Readings from Last 3 Encounters:  ?03/16/22 232 lb (105.2 kg)  ?01/20/22 228 lb (103.4 kg)  ?07/10/21 231 lb (104.8 kg)  ?  ? ? ?Other studies Reviewed: ?Additional studies/ records that were reviewed today include: TTE 11/27/20 ? 1. TDS. Left ventricular ejection fraction, by estimation, is 55 to 60%.  ?The left ventricle has normal function. The left ventricle has no regional  ?wall motion abnormalities. Left ventricular diastolic parameters are  ?indeterminate.  ? 2. Right ventricular systolic function is normal. The right ventricular  ?size is normal.  ? 3. Left atrial size was severely dilated.  ? 4. Right atrial size was mildly dilated.  ? 5. The mitral valve is normal in structure. Mild mitral valve  ?regurgitation. No evidence of mitral stenosis.  ? 6. The aortic valve is normal in structure. Aortic valve regurgitation is  ?not visualized. No aortic stenosis is present.   ? 7. The inferior vena cava is normal in size with greater than 50%  ?respiratory variability, suggesting right atrial pressure of 3 mmHg.  ? ? ?ASSESSMENT AND PLAN: ? ?1.  Permanent atrial fibrillation: Currently asymptomatic.  On Coumadin.  CHA2DS2-VASc of at least 4.  Is status post Medtronic Micra implanted for slow atrial fibrillation.  Device functioning appropriately.  No changes. ? ?2.  Coronary artery disease: Status post CABG.  Currently on Coumadin.  No current chest pain. ? ?3.  Hyperlipidemia: Continue statin per primary cardiology ? ?4.  Hypertension: Mildly elevated today.  Usually well controlled.  No changes. ? ?5.  Secondary hypercoagulable state: Continue Coumadin for atrial fibrillation as above. ? ?Current medicines are reviewed at length with the patient today.   ?The patient does not have concerns regarding his medicines.  The following changes were made today:  none ? ?Labs/ tests ordered today include:  ?Orders Placed This Encounter  ?Procedures  ? EKG 12-Lead  ? ? ? ?Disposition:   FU 12 months ? ?Signed, ?Dennies Coate Meredith Leeds, MD  ?03/16/2022 11:02 AM    ? ?[facility] ?[address] ?[address] ?[place] [address] ?[phone] (office) ?[phone] (fax) ?

## 2022-03-16 NOTE — Patient Instructions (Signed)
Medication Instructions:  ?Your physician recommends that you continue on your current medications as directed. Please refer to the Current Medication list given to you today. ? ?*If you need a refill on your cardiac medications before your next appointment, please call your pharmacy* ? ? ?Lab Work: ?None ordered ? ? ?Testing/Procedures: ?None ordered ? ? ?Follow-Up: ?At [facility], you and your health needs are our priority.  As part of our continuing mission to provide you with exceptional heart care, we have created designated Provider Care Teams.  These Care Teams include your primary Cardiologist (physician) and Advanced Practice Providers (APPs -  Physician Assistants and Nurse Practitioners) who all work together to provide you with the care you need, when you need it. ? ?Remote monitoring is used to monitor your Pacemaker or ICD from home. This monitoring reduces the number of office visits required to check your device to one time per year. It allows [place] to keep an eye on the functioning of your device to ensure it is working properly. You are scheduled for a device check from home on 06/15/2022. You may send your transmission at any time that day. If you have a wireless device, the transmission will be sent automatically. After your physician reviews your transmission, you will receive a postcard with your next transmission date. ? ?Your next appointment:   ?1 year(s) ? ?The format for your next appointment:   ?In Person ? ?Provider:   ?You will see one of the following Advanced Practice Providers on your designated Care Team:   ?Tommye Standard, PA-C ?Legrand Como "Jonni Sanger" [place], PA-C ? ? ?Thank you for choosing [facility]!! ? ? ?Trinidad Curet, RN ?[phone] ? ?

## 2022-03-19 ENCOUNTER — Encounter: Payer: Self-pay | Admitting: Family Medicine

## 2022-03-19 MED ORDER — FUROSEMIDE 40 MG PO TABS: 40.0000 mg | ORAL_TABLET | Freq: Every day | ORAL | 1 refills | Status: DC

## 2022-03-19 MED ORDER — TAMSULOSIN HCL 0.4 MG PO CAPS: 0.4000 mg | ORAL_CAPSULE | Freq: Every day | ORAL | 1 refills | Status: DC

## 2022-03-19 MED ORDER — POTASSIUM CHLORIDE ER 10 MEQ PO TBCR: 10.0000 meq | EXTENDED_RELEASE_TABLET | Freq: Every day | ORAL | 1 refills | Status: DC

## 2022-03-19 MED ORDER — ATORVASTATIN CALCIUM 20 MG PO TABS: 20.0000 mg | ORAL_TABLET | Freq: Every day | ORAL | 1 refills | Status: DC

## 2022-03-26 NOTE — Progress Notes (Signed)
Remote pacemaker transmission.   

## 2022-05-12 ENCOUNTER — Emergency Department (HOSPITAL_BASED_OUTPATIENT_CLINIC_OR_DEPARTMENT_OTHER): Payer: Medicare Other

## 2022-05-12 ENCOUNTER — Emergency Department (HOSPITAL_BASED_OUTPATIENT_CLINIC_OR_DEPARTMENT_OTHER)
Admission: EM | Admit: 2022-05-12 | Discharge: 2022-05-12 | Disposition: A | Discharge status: Home / Self Care | Payer: Medicare Other | Attending: Emergency Medicine | Admitting: Emergency Medicine

## 2022-05-12 ENCOUNTER — Encounter (HOSPITAL_BASED_OUTPATIENT_CLINIC_OR_DEPARTMENT_OTHER): Payer: Self-pay

## 2022-05-12 ENCOUNTER — Ambulatory Visit: Payer: Medicare Other | Admitting: Medical-Surgical

## 2022-05-12 DIAGNOSIS — Z7901 Long term (current) use of anticoagulants: Secondary | ICD-10-CM | POA: Insufficient documentation

## 2022-05-12 DIAGNOSIS — M25561 Pain in right knee: Secondary | ICD-10-CM | POA: Insufficient documentation

## 2022-05-12 MED ORDER — DICLOFENAC SODIUM 25 MG PO TBEC: 25.0000 mg | DELAYED_RELEASE_TABLET | Freq: Two times a day (BID) | ORAL | 0 refills | Status: DC

## 2022-05-12 MED ORDER — HYDROCODONE-ACETAMINOPHEN 5-325 MG PO TABS
1.0000 | ORAL_TABLET | Freq: Once | ORAL | Status: AC
  Administered 2022-05-12: 1 via ORAL
  Filled 2022-05-12: qty 1

## 2022-05-12 MED ORDER — HYDROCODONE-ACETAMINOPHEN 5-325 MG PO TABS: 2.0000 | ORAL_TABLET | ORAL | 0 refills | Status: DC | PRN

## 2022-05-12 NOTE — ED Triage Notes (Signed)
C/o right knee pain starting today, swelling noted. States bent down and heard a pop.

## 2022-05-12 NOTE — ED Provider Notes (Signed)
[facility] EMERGENCY DEPARTMENT Provider Note   CSN: [id number] Arrival date & time: 05/12/22  1450     History  Chief Complaint  Patient presents with   Knee Pain    Harry Kline is a 88 y.o. male.  HPI 88 year old male presents to the ER today with son at bedside for evaluation of a right knee injury.  Patient reports he has been having intermittent pain to his right knee for several months.  Patient reports that last year he had an MRI of his right knee that showed a chronic tear of his ACL along with tear of his meniscus.  Patient states that he does need a knee replacement.  He reports that today he was getting up from a chair when he heard a pop in his right knee and had significant swelling and pain.  Patient has limited range of motion and inability to bear weight because of the pain and swelling.  He has taken no medications for pain prior to arrival.  Denies any numbness or tingling.    Home Medications Prior to Admission medications   Medication Sig Start Date End Date Taking? Authorizing Provider  diclofenac (VOLTAREN) 25 MG EC tablet Take 1 tablet (25 mg total) by mouth 2 (two) times daily. 05/12/22  Yes Alira Fretwell, Zack Seal, PA-C  HYDROcodone-acetaminophen (NORCO/VICODIN) 5-325 MG tablet Take 2 tablets by mouth every 4 (four) hours as needed. 05/12/22  Yes Dominion Kathan, Zack Seal, PA-C  apixaban (ELIQUIS) 5 MG TABS tablet Take 1 tablet (5 mg total) by mouth 2 (two) times daily. 09/08/21   Camnitz, Ocie Doyne, MD  Ascorbic Acid (VITAMIN C) 1000 MG tablet 1,000 mg daily. 04/30/20   [provider]  atorvastatin (LIPITOR) 20 MG tablet Take 1 tablet (20 mg total) by mouth daily. 03/19/22   Luetta Nutting, DO  B Complex-Biotin-FA (B-100 COMPLEX CR PO)  05/31/20   [provider]  Coenzyme Q10 (CO Q 10) 100 MG CAPS Take by mouth daily.    [provider]  diphenhydrAMINE (BENADRYL) 25 MG tablet Take 25 mg by mouth at bedtime as needed.    [provider]  docusate sodium (COLACE) 100 MG capsule Take 100 mg by mouth daily.    [provider]  furosemide (LASIX) 40 MG tablet Take 1 tablet (40 mg total) by mouth daily. 03/19/22   Luetta Nutting, DO  Melatonin 10 MG CAPS Take by mouth at bedtime.    [provider]  Multiple Vitamin (MULTIVITAMIN) tablet Take 1 tablet by mouth daily.    [provider]  naproxen sodium (ALEVE) 220 MG tablet Take 220 mg by mouth daily as needed. 04/30/20   [provider]  Omega 3 1000 MG CAPS Take by mouth daily.    [provider]  potassium chloride (KLOR-CON) 10 MEQ tablet Take 1 tablet (10 mEq total) by mouth daily. 03/19/22   Luetta Nutting, DO  tamsulosin (FLOMAX) 0.4 MG CAPS capsule Take 1 capsule (0.4 mg total) by mouth daily. 03/19/22   Luetta Nutting, DO      Allergies    Patient has no known allergies.    Review of Systems   Review of Systems Please refer to the HPI Physical Exam Updated Vital Signs BP 111/87   Pulse 60   Temp 98.4 F (36.9 C) (Oral)   Resp 17   Ht '5\' 10"'$  (1.778 m)   Wt 104.3 kg   SpO2 99%   BMI 33.00 kg/m  Physical Exam  Vitals and nursing note reviewed.  Constitutional:      General: He is not in acute distress.    Appearance: He is well-developed. He is not ill-appearing or toxic-appearing.  HENT:     Head: Normocephalic and atraumatic.  Eyes:     General: No scleral icterus.       Right eye: No discharge.        Left eye: No discharge.  Cardiovascular:     Pulses: Normal pulses.  Pulmonary:     Effort: No respiratory distress.  Musculoskeletal:     Cervical back: Normal range of motion.     Comments: Limited range of motion of the right knee.  Patient does have large joint effusion.  There is no erythema or warmth.  Compartments are soft.  DP pulse are 2+.  Sensation intact.  Does have some joint laxity with anterior drawer test.  There is no laxity with valgus and varus stress.  Skin:    General: Skin is  warm and dry.     Capillary Refill: Capillary refill takes less than 2 seconds.     Coloration: Skin is not pale.  Neurological:     Mental Status: He is alert.     Sensory: No sensory deficit.     Motor: No weakness.  Psychiatric:        Behavior: Behavior normal.        Thought Content: Thought content normal.        Judgment: Judgment normal.     ED Results / Procedures / Treatments   Labs (all labs ordered are listed, but only abnormal results are displayed) Labs Reviewed - No data to display  EKG None  Radiology DG Knee Complete 4 Views Right  Result Date: 05/12/2022 CLINICAL DATA:  Acute onset right knee pain and swelling while bending down today. EXAM: RIGHT KNEE - COMPLETE 4+ VIEW COMPARISON:  None Available. FINDINGS: No evidence of acute fracture or dislocation. A moderate to large knee joint effusion is seen. Moderate osteoarthritis is seen involving the medial and patellofemoral compartments. Peripheral vascular calcification is noted. IMPRESSION: Moderate to large knee joint effusion. Moderate osteoarthritis involving medial and patellofemoral compartments. Electronically Signed   By: Marlaine Hind M.D.   On: 05/12/2022 15:34    Procedures Procedures    Medications Ordered in ED Medications  HYDROcodone-acetaminophen (NORCO/VICODIN) 5-325 MG per tablet 1 tablet (1 tablet Oral Given 05/12/22 1704)    ED Course/ Medical Decision Making/ A&P                           Medical Decision Making PainPatient presents for right knee injury.  Patient neurovascularly intact.  Does have large joint effusion.  I suspect patient has a ligamentous injury or meniscal injury.  Low suspicion for septic arthritis or gout at this time.  X-ray imaging was performed independently reviewed and interpreted shows joint effusion but no traumatic malalignment or fracture.  Encouraged patient to follow-up with orthopedic as will likely need an MRI.  Patient given occasion in the ER along with  Ace wrap.  Will give short course of pain medication in the outpatient setting.  Reviewed patient's narcotic database for the past 6 months.  Discussed with patient precautions with narcotic pain medication  Problems Addressed: Acute pain of right knee: self-limited or minor problem  Amount and/or Complexity of Data Reviewed Independent Historian: parent External Data Reviewed: radiology.    Details: Previous MRI results of  the right knee from last year Radiology: ordered and independent interpretation performed.  Risk Prescription drug management.           Final Clinical Impression(s) / ED Diagnoses Final diagnoses:  Acute pain of right knee    Rx / DC Orders ED Discharge Orders          Ordered    diclofenac (VOLTAREN) 25 MG EC tablet  2 times daily        05/12/22 1656    HYDROcodone-acetaminophen (NORCO/VICODIN) 5-325 MG tablet  Every 4 hours PRN        05/12/22 1656              Doristine Devoid, PA-C 05/12/22 1726    Lennice Sites, DO 05/12/22 1809

## 2022-05-12 NOTE — Discharge Instructions (Signed)
Ice and elevate the area.  Weight-bear as tolerated.  Take pain medication if needed pain this medication make you drowsy and constipated so be careful with taking it.  Follow-up with orthopedic doctor

## 2022-05-14 ENCOUNTER — Ambulatory Visit: Payer: Medicare Other | Admitting: Surgical

## 2022-05-14 ENCOUNTER — Other Ambulatory Visit: Payer: Self-pay

## 2022-05-14 DIAGNOSIS — M1711 Unilateral primary osteoarthritis, right knee: Secondary | ICD-10-CM | POA: Diagnosis not present

## 2022-05-14 DIAGNOSIS — M25561 Pain in right knee: Secondary | ICD-10-CM

## 2022-05-14 DIAGNOSIS — M25461 Effusion, right knee: Secondary | ICD-10-CM | POA: Diagnosis not present

## 2022-05-17 ENCOUNTER — Encounter: Payer: Self-pay | Admitting: Surgical

## 2022-05-17 MED ORDER — LIDOCAINE HCL 1 % IJ SOLN
5.0000 mL | INTRAMUSCULAR | Status: AC | PRN
  Administered 2022-05-14: 5 mL

## 2022-05-17 NOTE — Progress Notes (Signed)
Office Visit Note   Patient: Harry Kline           Date of Birth: 1933-12-13           MRN: [id number] Visit Date: 05/14/2022 Requested by: Luetta Nutting, [place],  [place] [address] PCP: Luetta Nutting, DO  Subjective: Chief Complaint  Patient presents with   Right Knee - Pain    HPI: Harry Kline is a 88 y.o. male who presents to the office complaining of right knee pain.  Patient states that he has a history of right knee pain but has been worse over the last several days.  About 2 days ago, he got out of his car and was walking across his yard to his house when he bent down to pick up a stick and then took another couple steps and felt a pop in his right knee.  He was not able to put any weight on his leg for several hours and had to go to the emergency department where he had radiographs demonstrating no acute abnormality.  He does have a history of MRI of the right knee from February 2022 that demonstrated extensive degenerative tearing of the posterior horn and body of the medial meniscus with advanced medial compartment arthritis and chronic ACL tear.  He states that since his initial injury several days ago, he has had steady improvement in his ability to ambulate.  He was walking with a walker yesterday and now is walking with a cane.  Denies any mechanical symptoms.  Does feel some subjective instability of the right knee.  He has had a lot of swelling since the injury as well.  He takes Eliquis as a blood thinner.  Denies any fevers or chills.  He has noticed since he saw Dr. Marlou Sa for review of his MRI of the right knee that his chronic knee pain has been causing more more episodes of increased pain.  Overall he is very active and enjoys walking for exercise he has considered right knee replacement but is not committed to surgery at this time..                ROS: All systems reviewed are negative as they relate to the chief complaint within the  history of present illness.  Patient denies fevers or chills.  Assessment & Plan: Visit Diagnoses:  1. Effusion of right knee   2. Arthritis of right knee     Plan: Patient is an 88 year old male who presents for evaluation of right knee pain and swelling.  He felt a pop while walking in his yard several days ago and has developed a very large effusion in the right knee.  He has had radiographs at the emergency department that were negative for any acute injury but he does have history of advanced medial compartment arthritis with ACL deficiency and medial meniscal degenerative tearing.  Had a long discussion about options for Ernie today which mainly included aspiration with injection versus consideration of total knee arthroplasty versus further imaging.  He is having improvement but due to the large effusion, he would like to have further imaging before deciding on best course of action which is reasonable.  He had 90 cc of hematoma aspirated from the right knee today and no injection was administered.  He is on Eliquis.  Not really at a point where he wants to consider knee replacement based on his current symptoms but he understands that  this most recent injury may cause increased knee pain in the future depending on what the MRI shows.  Plan for patient to return after MRI to review results and discuss next course of action.  He has had cortisone injections in the past that have not really helped except for maybe about a day.  Follow-Up Instructions: No follow-ups on file.   Orders:  No orders of the defined types were placed in this encounter.  No orders of the defined types were placed in this encounter.     Procedures: Large Joint Inj: R knee on 05/14/2022 10:16 AM Indications: diagnostic evaluation, joint swelling and pain Details: 18 G 1.5 in needle, superolateral approach  Arthrogram: No  Medications: 5 mL lidocaine 1 % Aspirate: 90 mL bloody Outcome: tolerated well, no  immediate complications Procedure, treatment alternatives, risks and benefits explained, specific risks discussed. Consent was given by the patient. Immediately prior to procedure a time out was called to verify the correct patient, procedure, equipment, support staff and site/side marked as required. Patient was prepped and draped in the usual sterile fashion.       Clinical Data: No additional findings.  Objective: Vital Signs: There were no vitals taken for this visit.  Physical Exam:  Constitutional: Patient appears well-developed HEENT:  Head: Normocephalic Eyes:EOM are normal Neck: Normal range of motion Cardiovascular: Normal rate Pulmonary/chest: Effort normal Neurologic: Patient is alert Skin: Skin is warm Psychiatric: Patient has normal mood and affect  Ortho Exam: Ortho exam demonstrates very large knee effusion.  No significantly increased warmth or erythema overlying the joint.  No calf tenderness.  Negative Homans' sign.  No pain with hip range of motion.  Patient is able to perform straight leg raise.  Tenderness over the medial joint line and lateral joint line.  He has range of motion from 0 degrees extension to about 100 degrees of knee flexion before he has a lot of pain with increased flexion.  Stable MCL and LCL stressing.  Stable to anterior and posterior drawer.  Specialty Comments:  No specialty comments available.  Imaging: No results found.   PMFS History: Patient Active Problem List   Diagnosis Date Noted   History of squamous cell carcinoma 09/09/2020   Pacemaker 02/19/2020   History of lumbar fusion 02/19/2020   Hyperlipidemia 02/19/2020   BPH (benign prostatic hyperplasia) 02/19/2020   Hypertension 02/19/2020   Atrial flutter ([place]) 02/19/2020   Paroxysmal atrial fibrillation ([place]) 02/19/2020   Mitral valve regurgitation 02/19/2020   First degree AV block 02/19/2020   Anxiety 02/19/2020   Obesity (BMI 30.0-34.9) 02/19/2020   Insomnia  02/19/2020   Past Medical History:  Diagnosis Date   Arthritis 2010   Diagnosed but not enough to treat with medication.   Atrial fibrillation (HCC)    Atrial fibrillation (HCC)    CAD (coronary artery disease)    Cataract 2009   surgery both eyes   GERD (gastroesophageal reflux disease) 2000   Hyperlipidemia    Hypertension    OSA (obstructive sleep apnea)    Pacemaker    Skin cancer    Sleep apnea 1990   c-pap machine    Family History  Problem Relation Age of Onset   Breast cancer Mother    Cancer Mother    Heart failure Father    Heart disease Father    Healthy Sister    Diabetes Paternal Grandmother    Heart failure Paternal Grandfather    Heart disease Paternal Grandfather  Past Surgical History:  Procedure Laterality Date   CORONARY ARTERY BYPASS GRAFT     EYE SURGERY  1985   RK surgery   SPINE SURGERY  2014   L4,L5 fusion   TONSILLECTOMY     Social History   Occupational History   Occupation: Retired.  Tobacco Use   Smoking status: Former    Packs/day: 1.00    Years: 20.00    Total pack years: 20.00    Types: Cigarettes    Quit date: 11/30/1973    Years since quitting: 48.4   Smokeless tobacco: Never  Substance and Sexual Activity   Alcohol use: Yes    Alcohol/week: 4.0 standard drinks of alcohol    Types: 1 Glasses of wine, 2 Cans of beer, 1 Shots of liquor per week    Comment: Occasional   Drug use: Never   Sexual activity: Not Currently

## 2022-05-27 ENCOUNTER — Telehealth: Payer: Self-pay | Admitting: *Deleted

## 2022-05-27 NOTE — Telephone Encounter (Signed)
   Pre-operative Risk Assessment    Patient Name: Harry Kline  DOB: 1934-05-29 MRN: [id number]      Request for Surgical Clearance    Procedure:   RIGHT TOTAL KNEE ARTHROPLASTY   Date of Surgery:  Clearance 05/27/22, NOT SURE IF THIS IS INTENDED SURGERY DATE AS IT WAS WRITTEN IN THE CORNER OF THE CLEARANCE REQUEST, WHICH IS ALSO TODAY'S DATE                            Surgeon:  DR. Alphonzo Severance Surgeon's Group or Practice Name:  [facility] Phone number:  [phone] Fax number:  [phone]   Type of Clearance Requested:   - Medical  - Pharmacy:  Hold Apixaban (Eliquis)     Type of Anesthesia:  Spinal   Additional requests/questions:    Jiles Prows   05/27/2022, 2:01 PM

## 2022-05-27 NOTE — Telephone Encounter (Signed)
Pt agreeable to plan of care for tele pre op appt 05/28/22 @ 1 pm.   Med rec and consent are done.     Patient Consent for Virtual Visit        Harry Kline has provided verbal consent on 05/27/2022 for a virtual visit (video or telephone).   CONSENT FOR VIRTUAL VISIT FOR:  Harry Kline  By participating in this virtual visit I agree to the following:  I hereby voluntarily request, consent and authorize [facility] and its employed or contracted physicians, physician assistants, nurse practitioners or other licensed health care professionals (the Practitioner), to provide me with telemedicine health care services (the "Services") as deemed necessary by the treating Practitioner. I acknowledge and consent to receive the Services by the Practitioner via telemedicine. I understand that the telemedicine visit will involve communicating with the Practitioner through live audiovisual communication technology and the disclosure of certain medical information by electronic transmission. I acknowledge that I have been given the opportunity to request an in-person assessment or other available alternative prior to the telemedicine visit and am voluntarily participating in the telemedicine visit.  I understand that I have the right to withhold or withdraw my consent to the use of telemedicine in the course of my care at any time, without affecting my right to future care or treatment, and that the Practitioner or I may terminate the telemedicine visit at any time. I understand that I have the right to inspect all information obtained and/or recorded in the course of the telemedicine visit and may receive copies of available information for a reasonable fee.  I understand that some of the potential risks of receiving the Services via telemedicine include:  Delay or interruption in medical evaluation due to technological equipment failure or disruption; Information transmitted may not be sufficient (e.g.  poor resolution of images) to allow for appropriate medical decision making by the Practitioner; and/or  In rare instances, security protocols could fail, causing a breach of personal health information.  Furthermore, I acknowledge that it is my responsibility to provide information about my medical history, conditions and care that is complete and accurate to the best of my ability. I acknowledge that Practitioner's advice, recommendations, and/or decision may be based on factors not within their control, such as incomplete or inaccurate data provided by me or distortions of diagnostic images or specimens that may result from electronic transmissions. I understand that the practice of medicine is not an exact science and that Practitioner makes no warranties or guarantees regarding treatment outcomes. I acknowledge that a copy of this consent can be made available to me via my patient portal ([facility]), or I can request a printed copy by calling the office of [facility].    I understand that my insurance will be billed for this visit.   I have read or had this consent read to me. I understand the contents of this consent, which adequately explains the benefits and risks of the Services being provided via telemedicine.  I have been provided ample opportunity to ask questions regarding this consent and the Services and have had my questions answered to my satisfaction. I give my informed consent for the services to be provided through the use of telemedicine in my medical care

## 2022-05-27 NOTE — Telephone Encounter (Signed)
Pt agreeable to plan of care for tele pre op appt 05/28/22 @ 1 pm.    Med rec and consent are done.

## 2022-05-27 NOTE — Telephone Encounter (Signed)
Patient with diagnosis of atrial fibrillation on Eliquis for anticoagulation.    Procedure: right total knee arthroplasty Date of procedure: TBD   CHA2DS2-VASc Score = 4   This indicates a 4.8% annual risk of stroke. The patient's score is based upon: CHF History: 0 HTN History: 1 Diabetes History: 0 Stroke History: 0 Vascular Disease History: 1 Age Score: 2 Gender Score: 0  CrCl 56 Platelet count 169  Per office protocol, patient can hold Eliquis for 3 days prior to procedure.   Patient will not need bridging with Lovenox (enoxaparin) around procedure.  **This guidance is not considered finalized until pre-operative APP has relayed final recommendations.**

## 2022-05-27 NOTE — Telephone Encounter (Signed)
    Name: Harry Kline  DOB: 07/10/1934  MRN: [id number]  Primary Cardiologist: Kirk Ruths, MD   Preoperative team, please contact this patient and set up a phone call appointment for further preoperative risk assessment. Please obtain consent and complete medication review. Thank you for your help.  I confirm that guidance regarding antiplatelet and oral anticoagulation therapy has been completed and, if necessary, noted below.    Ledora Bottcher, [place] 05/27/2022, 4:53 PM [place] [address] [place], [place] [address]

## 2022-05-28 ENCOUNTER — Ambulatory Visit (INDEPENDENT_AMBULATORY_CARE_PROVIDER_SITE_OTHER): Payer: Medicare Other | Admitting: Physician Assistant

## 2022-05-28 DIAGNOSIS — Z01810 Encounter for preprocedural cardiovascular examination: Secondary | ICD-10-CM | POA: Diagnosis not present

## 2022-05-28 NOTE — Progress Notes (Signed)
Virtual Visit via Telephone Note   Because of Harry Kline's co-morbid illnesses, he is at least at moderate risk for complications without adequate follow up.  This format is felt to be most appropriate for this patient at this time.  The patient did not have access to video technology/had technical difficulties with video requiring transitioning to audio format only (telephone).  All issues noted in this document were discussed and addressed.  No physical exam could be performed with this format.  Please refer to the patient's chart for his consent to telehealth for [facility].  Evaluation Performed:  Preoperative cardiovascular risk assessment _____________   Date:  05/28/2022   Patient ID:  Harry Kline, DOB 01-18-1934, MRN [id number] Patient Location:  Home Provider location:   Office  Primary Care Provider:  Luetta Nutting, DO Primary Cardiologist:  Kirk Ruths, MD  Chief Complaint / Patient Profile   88 y.o. y/o male with a h/o atrial fibrillation, coronary artery disease, hypertension, hyperlipidemia, and OSA who is pending right total knee arthroplasty and presents today for telephonic preoperative cardiovascular risk assessment.  Past Medical History    Past Medical History:  Diagnosis Date   Arthritis 2010   Diagnosed but not enough to treat with medication.   Atrial fibrillation ([place])    Atrial fibrillation ([place])    CAD (coronary artery disease)    Cataract 2009   surgery both eyes   GERD (gastroesophageal reflux disease) 2000   Hyperlipidemia    Hypertension    OSA (obstructive sleep apnea)    Pacemaker    Skin cancer    Sleep apnea 1990   c-pap machine   Past Surgical History:  Procedure Laterality Date   CORONARY ARTERY [place]   RK surgery   SPINE SURGERY  2014   L4,L5 fusion   TONSILLECTOMY      Allergies  No Known Allergies  History of Present Illness    Harry Kline is a 88 y.o. male who presents via  video conferencing for a telehealth visit today.  Pt was last seen in cardiology clinic on 03/16/2022 by Dr. Curt Bears.  At that time Harry Kline was doing well.  The patient is now pending procedure as outlined above. Since his last visit, he has been doing very well without any new cardiovascular symptoms.  He attends the [facility] 2-3 times a week and does a full body workout.  He also tries to walk 2 to 3 miles a day.  He does not participate in any high intensity recreational activities.  However according to the DASI he is at 5.62 METS which is above the 4 METS minimum requirement for clearance.  Reports no shortness of breath nor dyspnea on exertion. Reports no chest pain, pressure, or tightness. No edema, orthopnea, PND. Reports no palpitations.    Per pharmacy team okay to hold Eliquis 3 days prior to procedure.  Patient will not need bridging with Lovenox.  Please resume Eliquis when deemed medically safe to do so.  Home Medications    Prior to Admission medications   Medication Sig Start Date End Date Taking? Authorizing Provider  apixaban (ELIQUIS) 5 MG TABS tablet Take 1 tablet (5 mg total) by mouth 2 (two) times daily. 09/08/21   Camnitz, Ocie Doyne, MD  Ascorbic Acid (VITAMIN C) 1000 MG tablet 1,000 mg daily. 04/30/20   [provider]  atorvastatin (LIPITOR) 20 MG tablet Take 1 tablet (20 mg total) by mouth  daily. 03/19/22   Luetta Nutting, DO  B Complex-Biotin-FA (B-100 COMPLEX CR PO)  05/31/20   [provider]  Coenzyme Q10 (CO Q 10) 100 MG CAPS Take by mouth daily.    [provider]  diclofenac (VOLTAREN) 25 MG EC tablet Take 1 tablet (25 mg total) by mouth 2 (two) times daily. 05/12/22   Doristine Devoid, PA-C  diphenhydrAMINE (BENADRYL) 25 MG tablet Take 25 mg by mouth at bedtime as needed.    [provider]  docusate sodium (COLACE) 100 MG capsule Take 100 mg by mouth daily.    [provider]  furosemide (LASIX) 40 MG tablet Take 1  tablet (40 mg total) by mouth daily. 03/19/22   Luetta Nutting, DO  HYDROcodone-acetaminophen (NORCO/VICODIN) 5-325 MG tablet Take 2 tablets by mouth every 4 (four) hours as needed. 05/12/22   Doristine Devoid, PA-C  Melatonin 10 MG CAPS Take by mouth at bedtime.    [provider]  Multiple Vitamin (MULTIVITAMIN) tablet Take 1 tablet by mouth daily.    [provider]  naproxen sodium (ALEVE) 220 MG tablet Take 220 mg by mouth daily as needed. 04/30/20   [provider]  Omega 3 1000 MG CAPS Take by mouth daily.    [provider]  potassium chloride (KLOR-CON) 10 MEQ tablet Take 1 tablet (10 mEq total) by mouth daily. 03/19/22   Luetta Nutting, DO  tamsulosin (FLOMAX) 0.4 MG CAPS capsule Take 1 capsule (0.4 mg total) by mouth daily. 03/19/22   Luetta Nutting, DO    Physical Exam    Vital Signs:  Rayfield Beem does not have vital signs available for review today.  Given telephonic nature of communication, physical exam is limited. AAOx3. NAD. Normal affect.  Speech and respirations are unlabored.  Accessory Clinical Findings    None  Assessment & Plan    1.  Preoperative Cardiovascular Risk Assessment: {  Harry Kline perioperative risk of a major cardiac event is 0.9% according to the Revised Cardiac Risk Index (RCRI).  Therefore, he is at low risk for perioperative complications.   His functional capacity is good at 5.62 METs according to the [facility] Activity Status Index (DASI). Recommendations: According to ACC/AHA guidelines, no further cardiovascular testing needed.  The patient may proceed to surgery at acceptable risk.   Antiplatelet and/or Anticoagulation Recommendations: Eliquis (Apixaban) can be held for 3 days prior to surgery.  Please resume post op when felt to be safe.     A copy of this note will be routed to requesting surgeon.  Time:   Today, I have spent 10 minutes with the patient with telehealth technology discussing medical  history, symptoms, and management plan.     Elgie Collard, PA-C  05/28/2022, 12:55 PM

## 2022-06-05 ENCOUNTER — Encounter: Payer: Self-pay | Admitting: Cardiology

## 2022-06-05 ENCOUNTER — Ambulatory Visit: Payer: Medicare Other | Admitting: Orthopedic Surgery

## 2022-06-05 DIAGNOSIS — M1711 Unilateral primary osteoarthritis, right knee: Secondary | ICD-10-CM

## 2022-06-06 ENCOUNTER — Encounter: Payer: Self-pay | Admitting: Orthopedic Surgery

## 2022-06-06 NOTE — Progress Notes (Signed)
Office Visit Note   Patient: Harry Kline           Date of Birth: 1934-02-11           MRN: [id number] Visit Date: 06/05/2022 Requested by: Luetta Nutting, DO [place],  [place] [address] PCP: Luetta Nutting, DO  Subjective: Chief Complaint  Patient presents with   Right Knee - Pain    HPI: Harry Kline is an 88 year old patient with right knee pain.  Had right knee aspiration on 05/14/2022.  That helped him some.  He has had prior failed response to injections of gel and cortisone in the past.  Patient underwent bypass surgery 23 years ago.  He does have a pacemaker and takes Eliquis for atrial fibrillation.  He reports fairly significant pain in that right knee.  He enjoys walking.  Would like to travel to [place] in [place] next year.  He does report pain which really prevents him from doing any type of prolonged standing or walking.  He is a retired Software engineer.  Son is a Software engineer and lives in town.  Patient has no stairs at home but does live by himself.  Patient does appear to be physically younger than his chronological age of 82.              ROS: All systems reviewed are negative as they relate to the chief complaint within the history of present illness.  Patient denies  fevers or chills.   Assessment & Plan: Visit Diagnoses:  1. Arthritis of right knee     Plan: Impression is end-stage right knee arthritis.  Had a long talk with [name] in his son today about operative and nonoperative treatment options.  In general the arthritis is progressive and the pain is significant to the point where it is interfering with his activities of daily living as well as plans for travel.  The extensive nature of the rehabilitative process after knee replacement is discussed with [name] and his son.  It is difficult for me to walk down his driveway as an example of how this interferes with his activities of daily living.  The risk including not limited to infection  nerve vessel damage perioperative stroke heart attack and other unforeseen circumstances are discussed.  Overall however I think her knee is very motivated to rehabilitate after his knee replacement.  He will need cardiac risk ratification as well as recommendation for management of Eliquis in the perioperative.  From his cardiologist.  Patient understands risk and benefits and wishes to proceed.  All questions answered.  Follow-Up Instructions: No follow-ups on file.   Orders:  No orders of the defined types were placed in this encounter.  No orders of the defined types were placed in this encounter.     Procedures: No procedures performed   Clinical Data: No additional findings.  Objective: Vital Signs: There were no vitals taken for this visit.  Physical Exam:   Constitutional: Patient appears well-developed HEENT:  Head: Normocephalic Eyes:EOM are normal Neck: Normal range of motion Cardiovascular: Normal rate Pulmonary/chest: Effort normal Neurologic: Patient is alert Skin: Skin is warm Psychiatric: Patient has normal mood and affect   Ortho Exam: Ortho exam demonstrates 15 degree flexion contracture on the right with varus alignment.  Pedal pulses palpable.  Ankle dorsiflexion intact.  No groin pain with internal/external rotation of the leg.  No other masses lymphadenopathy or skin changes noted in that knee region.  Specialty Comments:  No specialty comments available.  Imaging: No results found.   PMFS History: Patient Active Problem List   Diagnosis Date Noted   History of squamous cell carcinoma 09/09/2020   Pacemaker 02/19/2020   History of lumbar fusion 02/19/2020   Hyperlipidemia 02/19/2020   BPH (benign prostatic hyperplasia) 02/19/2020   Hypertension 02/19/2020   Atrial flutter ([place]) 02/19/2020   Paroxysmal atrial fibrillation ([place]) 02/19/2020   Mitral valve regurgitation 02/19/2020   First degree AV block 02/19/2020   Anxiety 02/19/2020    Obesity (BMI 30.0-34.9) 02/19/2020   Insomnia 02/19/2020   Past Medical History:  Diagnosis Date   Arthritis 2010   Diagnosed but not enough to treat with medication.   Atrial fibrillation ([place])    Atrial fibrillation ([place])    CAD (coronary artery disease)    Cataract 2009   surgery both eyes   GERD (gastroesophageal reflux disease) 2000   Hyperlipidemia    Hypertension    OSA (obstructive sleep apnea)    Pacemaker    Skin cancer    Sleep apnea 1990   c-pap machine    Family History  Problem Relation Age of Onset   Breast cancer Mother    Cancer Mother    Heart failure Father    Heart disease Father    Healthy Sister    Diabetes Paternal Grandmother    Heart failure Paternal Grandfather    Heart disease Paternal Grandfather     Past Surgical History:  Procedure Laterality Date   CORONARY ARTERY BYPASS GRAFT     EYE SURGERY  1985   RK surgery   SPINE SURGERY  2014   L4,L5 fusion   TONSILLECTOMY     Social History   Occupational History   Occupation: Retired.  Tobacco Use   Smoking status: Former    Packs/day: 1.00    Years: 20.00    Total pack years: 20.00    Types: Cigarettes    Quit date: 11/30/1973    Years since quitting: 48.5   Smokeless tobacco: Never  Substance and Sexual Activity   Alcohol use: Yes    Alcohol/week: 4.0 standard drinks of alcohol    Types: 1 Glasses of wine, 2 Cans of beer, 1 Shots of liquor per week    Comment: Occasional   Drug use: Never   Sexual activity: Not Currently

## 2022-06-09 ENCOUNTER — Ambulatory Visit (INDEPENDENT_AMBULATORY_CARE_PROVIDER_SITE_OTHER): Payer: Medicare Other

## 2022-06-09 DIAGNOSIS — Z01810 Encounter for preprocedural cardiovascular examination: Secondary | ICD-10-CM | POA: Diagnosis not present

## 2022-06-10 LAB — CUP PACEART REMOTE DEVICE CHECK
Battery Remaining Longevity: 96 mo
Battery Voltage: 3.01 V
Brady Statistic RV Percent Paced: 73.41 %
Date Time Interrogation Session: 20230711110000
Implantable Pulse Generator Implant Date: 20210128
Lead Channel Impedance Value: 540 Ohm
Lead Channel Pacing Threshold Amplitude: 0.5 V
Lead Channel Pacing Threshold Pulse Width: 0.24 ms
Lead Channel Sensing Intrinsic Amplitude: 8.438 mV
Lead Channel Setting Pacing Amplitude: 1 V
Lead Channel Setting Pacing Pulse Width: 0.24 ms
Lead Channel Setting Sensing Sensitivity: 2 mV

## 2022-06-15 ENCOUNTER — Encounter: Payer: Self-pay | Admitting: Cardiology

## 2022-06-16 ENCOUNTER — Encounter: Payer: Self-pay | Admitting: Cardiology

## 2022-06-16 NOTE — Pre-Procedure Instructions (Signed)
Surgical Instructions    Your procedure is scheduled on Tuesday, July 25th.  Report to [facility] Main Entrance "A" at 9:20 A.M., then check in with the Admitting office.  Call this number if you have problems the morning of surgery:  [phone]   If you have any questions prior to your surgery date call [phone]: Open Monday-Friday 8am-4pm    Remember:  Do not eat after midnight the night before your surgery  You may drink clear liquids until 8:20 a.m. the morning of your surgery.   Clear liquids allowed are: Water, Non-Citrus Juices (without pulp), Carbonated Beverages, Clear Tea, Black Coffee ONLY (NO MILK, CREAM OR POWDERED CREAMER of any kind), and Gatorade.   Enhanced Recovery after Surgery for Orthopedics Enhanced Recovery after Surgery is a protocol used to improve the stress on your body and your recovery after surgery.  Patient Instructions  The day of surgery (if you do NOT have diabetes):  Drink ONE (1) Pre-Surgery Clear Ensure by 8:20 a.m. the morning of surgery   This drink was given to you during your hospital  pre-op appointment visit. Nothing else to drink after completing the  Pre-Surgery Clear Ensure.         If you have questions, please contact your surgeon's office.     Take these medicines the morning of surgery with A SIP OF WATER: NONE  Per your cardiologist, HOLD apixaban (ELIQUIS) 3 days pre-op. Last dose will be Friday, 06/19/22.  As of today, STOP taking any Aspirin (unless otherwise instructed by your surgeon) Aleve, Naproxen, Ibuprofen, Motrin, Advil, Goody's, BC's, all herbal medications, fish oil, and all vitamins.          Do not wear jewelry Do not wear lotions, powders, colognes, or deodorant. Men may shave face and neck. Do not bring valuables to the hospital.  [facility] is not responsible for any belongings or valuables. .   Do NOT Smoke (Tobacco/Vaping)  24 hours prior to your procedure  If you use a CPAP at night, you may  bring your mask for your overnight stay.   Contacts, glasses, hearing aids, dentures or partials may not be worn into surgery, please bring cases for these belongings   For patients admitted to the hospital, discharge time will be determined by your treatment team.   Patients discharged the day of surgery will not be allowed to drive home, and someone needs to stay with them for 24 hours.   SURGICAL WAITING ROOM VISITATION Patients having surgery or a procedure may have no more than 2 support people in the waiting area - these visitors may rotate.   Children under the age of 89 must have an adult with them who is not the patient. If the patient needs to stay at the hospital during part of their recovery, the visitor guidelines for inpatient rooms apply. Pre-op nurse will coordinate an appropriate time for 1 support person to accompany patient in pre-op.  This support person may not rotate.   Please refer to the [facility] website for the visitor guidelines for Inpatients (after your surgery is over and you are in a regular room).    Special instructions:    Oral Hygiene is also important to reduce your risk of infection.  Remember - BRUSH YOUR TEETH THE MORNING OF SURGERY WITH YOUR REGULAR TOOTHPASTE   [place]- Preparing For Surgery  Before surgery, you can play an important role. Because skin is not sterile, your skin needs to be as free  of germs as possible. You can reduce the number of germs on your skin by washing with CHG (chlorahexidine gluconate) Soap before surgery.  CHG is an antiseptic cleaner which kills germs and bonds with the skin to continue killing germs even after washing.     Please do not use if you have an allergy to CHG or antibacterial soaps. If your skin becomes reddened/irritated stop using the CHG.  Do not shave (including legs and underarms) for at least 48 hours prior to first CHG shower. It is OK to shave your face.  Please follow these instructions  carefully.     Shower the NIGHT BEFORE SURGERY and the MORNING OF SURGERY with CHG Soap.   If you chose to wash your hair, wash your hair first as usual with your normal shampoo. After you shampoo, rinse your hair and body thoroughly to remove the shampoo.  Then [employer] and genitals (private parts) with your normal soap and rinse thoroughly to remove soap.  After that Use CHG Soap as you would any other liquid soap. You can apply CHG directly to the skin and wash gently with a scrungie or a clean washcloth.   Apply the CHG Soap to your body ONLY FROM THE NECK DOWN.  Do not use on open wounds or open sores. Avoid contact with your eyes, ears, mouth and genitals (private parts). Wash Face and genitals (private parts)  with your normal soap.   Wash thoroughly, paying special attention to the area where your surgery will be performed.  Thoroughly rinse your body with warm water from the neck down.  DO NOT shower/wash with your normal soap after using and rinsing off the CHG Soap.  Pat yourself dry with a CLEAN TOWEL.  Wear CLEAN PAJAMAS to bed the night before surgery  Place CLEAN SHEETS on your bed the night before your surgery  DO NOT SLEEP WITH PETS.   Day of Surgery:  Take a shower with CHG soap. Wear Clean/Comfortable clothing the morning of surgery Do not apply any deodorants/lotions.   Remember to brush your teeth WITH YOUR REGULAR TOOTHPASTE.    If you received a COVID test during your pre-op visit, it is requested that you wear a mask when out in public, stay away from anyone that may not be feeling well, and notify your surgeon if you develop symptoms. If you have been in contact with anyone that has tested positive in the last 10 days, please notify your surgeon.    Please read over the following fact sheets that you were given.

## 2022-06-16 NOTE — Progress Notes (Signed)
[facility] DEVICE PROGRAMMING  Patient Information: Name:  Harry Kline  DOB:  [birth year]  MRN:  [id number]    Planned Procedure:  Right Total Knee Arthroplasty  Surgeon:  Dr. Cheral Almas  Date of Procedure:  06/23/22  Cautery will be used.  Position during surgery:  Supine   Please send documentation back to:  [facility] (Fax # [phone])  Device Information:  Clinic EP Physician:  Allegra Lai, MD   Device Type:  Pacemaker Manufacturer and Phone #:  Medtronic: [phone] Pacemaker Dependent?:  No. Date of Last Device Check:  06/09/2022 Normal Device Function?:  Yes.    Electrophysiologist's Recommendations:  Have magnet available. Provide continuous ECG monitoring when magnet is used or reprogramming is to be performed.  Procedure should not interfere with device function.  No device programming or magnet placement needed.  Per Device Clinic Standing Orders, Wanda Plump, RN  2:07 PM 06/16/2022

## 2022-06-17 ENCOUNTER — Encounter (HOSPITAL_COMMUNITY)
Admission: RE | Admit: 2022-06-17 | Discharge: 2022-06-17 | Disposition: A | Discharge status: Home / Self Care | Payer: Medicare Other | Source: Ambulatory Visit | Attending: Orthopedic Surgery | Admitting: Orthopedic Surgery

## 2022-06-17 ENCOUNTER — Encounter (HOSPITAL_COMMUNITY): Payer: Self-pay

## 2022-06-17 ENCOUNTER — Other Ambulatory Visit: Payer: Self-pay

## 2022-06-17 VITALS — BP 156/84 | HR 84 | Temp 97.7°F | Resp 17 | Ht 70.0 in | Wt 231.5 lb

## 2022-06-17 DIAGNOSIS — Z01812 Encounter for preprocedural laboratory examination: Secondary | ICD-10-CM | POA: Diagnosis not present

## 2022-06-17 DIAGNOSIS — Z01818 Encounter for other preprocedural examination: Secondary | ICD-10-CM

## 2022-06-17 LAB — BASIC METABOLIC PANEL
Anion gap: 13 (ref 5–15)
BUN: 19 mg/dL (ref 8–23)
CO2: 23 mmol/L (ref 22–32)
Calcium: 9.4 mg/dL (ref 8.9–10.3)
Chloride: 105 mmol/L (ref 98–111)
Creatinine, Ser: 1.19 mg/dL (ref 0.61–1.24)
GFR, Estimated: 60 mL/min — ABNORMAL LOW (ref >60)
Glucose, Bld: 104 mg/dL — ABNORMAL HIGH (ref 70–99)
Potassium: 3.4 mmol/L — ABNORMAL LOW (ref 3.5–5.1)
Sodium: 141 mmol/L (ref 135–145)

## 2022-06-17 LAB — CBC
HCT: 39.5 % (ref 39.0–52.0)
Hemoglobin: 14.5 g/dL (ref 13.0–17.0)
MCH: 35.3 pg — ABNORMAL HIGH (ref 26.0–34.0)
MCHC: 36.7 g/dL — ABNORMAL HIGH (ref 30.0–36.0)
MCV: 96.1 fL (ref 80.0–100.0)
Platelets: 157 10*3/uL (ref 150–400)
RBC: 4.11 MIL/uL — ABNORMAL LOW (ref 4.22–5.81)
RDW: 13.1 % (ref 11.5–15.5)
WBC: 6.1 10*3/uL (ref 4.0–10.5)
nRBC: 0 % (ref 0.0–0.2)

## 2022-06-17 LAB — URINALYSIS, COMPLETE (UACMP) WITH MICROSCOPIC
Bilirubin Urine: NEGATIVE
Glucose, UA: NEGATIVE mg/dL
Hgb urine dipstick: NEGATIVE
Ketones, ur: NEGATIVE mg/dL
Leukocytes,Ua: NEGATIVE
Nitrite: NEGATIVE
Protein, ur: NEGATIVE mg/dL
Specific Gravity, Urine: 1.025 (ref 1.005–1.030)
pH: 6 (ref 5.0–8.0)

## 2022-06-17 LAB — SURGICAL PCR SCREEN
MRSA, PCR: NEGATIVE
Staphylococcus aureus: NEGATIVE

## 2022-06-17 NOTE — Progress Notes (Signed)
PCP - Luetta Nutting Cardiologist - Kirk Ruths EP- Will Camnitz  PPM/ICD - Medtronic Device Orders - in epic and in paper chart Rep Notified - email sent to [employer] Rep Leanna Sato  Chest x-ray - N/A EKG - 03/06/22 Stress Test - pt reports stress test "years ago" in [place] ECHO - 11/27/20 Cardiac Cath - pt reports cath at some point, unsure when or where  Sleep Study - done in [place] a "few years ago" at [facility]. Records requested CPAP - wears CPAP with variable settings 5-15  Blood Thinner Instructions: Pt states he was told by MD to take last dose of Eliquis 06/19/22   ERAS Protcol - ERAS + DRINK COVID TEST- N/A   Anesthesia review: yes, heart history, pt with PPM, pt denies any recent cardiac issues.   Patient denies shortness of breath, fever, cough and chest pain at PAT appointment   All instructions explained to the patient, with a verbal understanding of the material. Patient agrees to go over the instructions while at home for a better understanding.

## 2022-06-18 LAB — URINE CULTURE: Culture: NO GROWTH

## 2022-06-18 NOTE — Anesthesia Preprocedure Evaluation (Addendum)
Anesthesia Evaluation  Patient identified by MRN, date of birth, ID band Patient awake    Reviewed: Allergy & Precautions, NPO status , Patient's Chart, lab work & pertinent test results  Airway Mallampati: III  TM Distance: >3 FB Neck ROM: Full  Mouth opening: Limited Mouth Opening  Dental no notable dental hx. (+) Teeth Intact, Dental Advisory Given   Pulmonary sleep apnea and Continuous Positive Airway Pressure Ventilation , former smoker,    Pulmonary exam normal breath sounds clear to auscultation       Cardiovascular hypertension, + CAD and + CABG  Normal cardiovascular exam+ dysrhythmias Atrial Fibrillation + pacemaker  Rhythm:Regular Rate:Normal     Neuro/Psych PSYCHIATRIC DISORDERS Anxiety negative neurological ROS     GI/Hepatic Neg liver ROS, GERD  ,  Endo/Other  negative endocrine ROS  Renal/GU negative Renal ROS  negative genitourinary   Musculoskeletal  (+) Arthritis ,   Abdominal   Peds  Hematology  (+) Blood dyscrasia (on eliquis, last dose 7/21/ 10pm), ,   Anesthesia Other Findings EKG 03/16/2022 (read per cardiology note same date): Atrial fibrillation, V paced.  Perioperative prescription for implanted cardiac device programming per progress note 06/16/2022: Device Information:  Clinic EP Physician:Will Curt Bears, MD  Device Type:Pacemaker Manufacturer and Phone #:Medtronic: [id number] Pacemaker Dependent?:No. Date of Last Device Check:7/11/2023Normal Device Function?:Yes.  Electrophysiologist's Recommendations: Have magnet available. Provide continuous ECG monitoring when magnet is used or reprogramming is to be performed. Procedure should not interfere with device function. No device programming or magnet placement needed.  TTE 11/27/2020: 1. TDS. Left ventricular ejection fraction, by estimation, is 55 to 60%.  The left ventricle has normal function. The  left ventricle has no regional  wall motion abnormalities. Left ventricular diastolic parameters are  indeterminate.  2. Right ventricular systolic function is normal. The right ventricular  size is normal.  3. Left atrial size was severely dilated.  4. Right atrial size was mildly dilated.  5. The mitral valve is normal in structure. Mild mitral valve  regurgitation. No evidence of mitral stenosis.  6. The aortic valve is normal in structure. Aortic valve regurgitation is  not visualized. No aortic stenosis is present.  7. The inferior vena cava is normal in size with greater than 50%  respiratory variability, suggesting right atrial pressure of 3 mmHg.   Reproductive/Obstetrics                          Anesthesia Physical Anesthesia Plan  ASA: 3  Anesthesia Plan: Regional and Spinal   Post-op Pain Management: Regional block* and Tylenol PO (pre-op)*   Induction: Intravenous  PONV Risk Score and Plan: 1 and Propofol infusion, Treatment may vary due to age or medical condition, Ondansetron and Dexamethasone  Airway Management Planned: Natural Airway  Additional Equipment:   Intra-op Plan:   Post-operative Plan:   Informed Consent: I have reviewed the patients History and Physical, chart, labs and discussed the procedure including the risks, benefits and alternatives for the proposed anesthesia with the patient or authorized representative who has indicated his/her understanding and acceptance.     Dental advisory given  Plan Discussed with: CRNA  Anesthesia Plan Comments: ( )       Anesthesia Quick Evaluation

## 2022-06-18 NOTE — Progress Notes (Signed)
Anesthesia Chart Review:  Harry Kline for history of atrial fibrillation on Eliquis, s/p Medtronic Micra PPM for slow A-fib, CAD s/p CABG ~15 years ago, HTN, HLD, OSA on CPAP.  Seen by Harry Rough, Harry Kline 05/28/2022 for preop evaluation.  Per note, "Harry Kline's perioperative risk of a major cardiac event is 0.9% according to the Revised Cardiac Risk Index (RCRI).  Therefore, he is at low risk for perioperative complications.   His functional capacity is good at 5.62 METs according to the Duke Activity Status Index (DASI). Recommendations: According to ACC/AHA guidelines, no further cardiovascular testing needed.  The patient may proceed to surgery at acceptable risk.  Antiplatelet and/or Anticoagulation Recommendations: Eliquis (Apixaban) can be held for 3 days prior to surgery.  Please resume post op when felt to be safe."  Patient reports last dose Eliquis 06/19/2022.  Preop labs reviewed, unremarkable.  EKG 03/16/2022 (read per Kline note same date): Atrial fibrillation, V paced.  Perioperative prescription for implanted cardiac device programming per progress note 06/16/2022: Device Information:   Clinic EP Physician:  Harry Lai, Harry Kline    Device Type:  Pacemaker Manufacturer and Phone #:  Medtronic: [phone] Pacemaker Dependent?:  No. Date of Last Device Check:  06/09/2022       Normal Device Function?:  Yes.     Electrophysiologist's Recommendations:   Have magnet available. Provide continuous ECG monitoring when magnet is used or reprogramming is to be performed.  Procedure should not interfere with device function.  No device programming or magnet placement needed.  TTE 11/27/2020:  1. TDS. Left ventricular ejection fraction, by estimation, is 55 to 60%.  The left ventricle has normal function. The left ventricle has no regional  wall motion abnormalities. Left ventricular diastolic parameters are  indeterminate.   2. Right ventricular systolic function is normal. The  right ventricular  size is normal.   3. Left atrial size was severely dilated.   4. Right atrial size was mildly dilated.   5. The mitral valve is normal in structure. Mild mitral valve  regurgitation. No evidence of mitral stenosis.   6. The aortic valve is normal in structure. Aortic valve regurgitation is  not visualized. No aortic stenosis is present.   7. The inferior vena cava is normal in size with greater than 50%  respiratory variability, suggesting right atrial pressure of 3 mmHg.    Harry Kline [facility] Short Stay Center/Anesthesiology Phone [phone] 06/18/2022 2:01 PM

## 2022-06-22 MED ORDER — TRANEXAMIC ACID 1000 MG/10ML IV SOLN
2000.0000 mg | INTRAVENOUS | Status: DC
  Filled 2022-06-22: qty 20

## 2022-06-23 ENCOUNTER — Observation Stay (HOSPITAL_COMMUNITY)
Admission: RE | Admit: 2022-06-23 | Discharge: 2022-06-24 | Disposition: A | Discharge status: Home Health | Payer: Medicare Other | Attending: Orthopedic Surgery | Admitting: Orthopedic Surgery

## 2022-06-23 ENCOUNTER — Ambulatory Visit (HOSPITAL_COMMUNITY): Payer: Medicare Other | Admitting: Physician Assistant

## 2022-06-23 ENCOUNTER — Encounter (HOSPITAL_COMMUNITY): Payer: Self-pay | Admitting: Orthopedic Surgery

## 2022-06-23 ENCOUNTER — Ambulatory Visit (HOSPITAL_BASED_OUTPATIENT_CLINIC_OR_DEPARTMENT_OTHER): Payer: Medicare Other | Admitting: Certified Registered Nurse Anesthetist

## 2022-06-23 ENCOUNTER — Other Ambulatory Visit: Payer: Self-pay

## 2022-06-23 ENCOUNTER — Encounter (HOSPITAL_COMMUNITY)
Admission: RE | Disposition: A | Discharge status: Home Health | Payer: Self-pay | Source: Home / Self Care | Attending: Orthopedic Surgery

## 2022-06-23 DIAGNOSIS — M1710 Unilateral primary osteoarthritis, unspecified knee: Secondary | ICD-10-CM | POA: Diagnosis present

## 2022-06-23 DIAGNOSIS — Z951 Presence of aortocoronary bypass graft: Secondary | ICD-10-CM | POA: Insufficient documentation

## 2022-06-23 DIAGNOSIS — Z87891 Personal history of nicotine dependence: Secondary | ICD-10-CM | POA: Diagnosis not present

## 2022-06-23 DIAGNOSIS — I2510 Atherosclerotic heart disease of native coronary artery without angina pectoris: Secondary | ICD-10-CM | POA: Insufficient documentation

## 2022-06-23 DIAGNOSIS — I10 Essential (primary) hypertension: Secondary | ICD-10-CM | POA: Diagnosis not present

## 2022-06-23 DIAGNOSIS — Z950 Presence of cardiac pacemaker: Secondary | ICD-10-CM | POA: Insufficient documentation

## 2022-06-23 DIAGNOSIS — M1711 Unilateral primary osteoarthritis, right knee: Secondary | ICD-10-CM | POA: Diagnosis not present

## 2022-06-23 DIAGNOSIS — Z79899 Other long term (current) drug therapy: Secondary | ICD-10-CM | POA: Diagnosis not present

## 2022-06-23 DIAGNOSIS — Z85828 Personal history of other malignant neoplasm of skin: Secondary | ICD-10-CM | POA: Diagnosis not present

## 2022-06-23 DIAGNOSIS — M179 Osteoarthritis of knee, unspecified: Secondary | ICD-10-CM | POA: Diagnosis present

## 2022-06-23 DIAGNOSIS — Z01818 Encounter for other preprocedural examination: Secondary | ICD-10-CM

## 2022-06-23 HISTORY — PX: TOTAL KNEE ARTHROPLASTY: SHX125

## 2022-06-23 SURGERY — ARTHROPLASTY, KNEE, TOTAL
Anesthesia: Regional | Site: Knee | Laterality: Right

## 2022-06-23 MED ORDER — CLONIDINE HCL (ANALGESIA) 100 MCG/ML EP SOLN
EPIDURAL | Status: AC
  Filled 2022-06-23: qty 10

## 2022-06-23 MED ORDER — FUROSEMIDE 40 MG PO TABS
40.0000 mg | ORAL_TABLET | Freq: Every day | ORAL | Status: DC
  Administered 2022-06-24: 40 mg via ORAL
  Filled 2022-06-23 (×2): qty 1

## 2022-06-23 MED ORDER — CEFAZOLIN SODIUM-DEXTROSE 1-4 GM/50ML-% IV SOLN
1.0000 g | Freq: Three times a day (TID) | INTRAVENOUS | Status: DC
  Administered 2022-06-23 – 2022-06-24 (×2): 1 g via INTRAVENOUS
  Filled 2022-06-23 (×2): qty 50

## 2022-06-23 MED ORDER — FENTANYL CITRATE (PF) 100 MCG/2ML IJ SOLN: 50.0000 ug | Freq: Once | INTRAMUSCULAR | Status: AC

## 2022-06-23 MED ORDER — CEFAZOLIN SODIUM-DEXTROSE 2-4 GM/100ML-% IV SOLN
INTRAVENOUS | Status: AC
  Filled 2022-06-23: qty 100

## 2022-06-23 MED ORDER — MENTHOL 3 MG MT LOZG: 1.0000 | LOZENGE | OROMUCOSAL | Status: DC | PRN

## 2022-06-23 MED ORDER — BUPIVACAINE LIPOSOME 1.3 % IJ SUSP
INTRAMUSCULAR | Status: AC
  Filled 2022-06-23: qty 20

## 2022-06-23 MED ORDER — SODIUM CHLORIDE 0.9 % IR SOLN
Status: DC | PRN
  Administered 2022-06-23: 3000 mL

## 2022-06-23 MED ORDER — IRRISEPT - 450ML BOTTLE WITH 0.05% CHG IN STERILE WATER, USP 99.95% OPTIME
TOPICAL | Status: DC | PRN
  Administered 2022-06-23: 450 mL

## 2022-06-23 MED ORDER — CHLORHEXIDINE GLUCONATE 0.12 % MT SOLN: 15.0000 mL | Freq: Once | OROMUCOSAL | Status: AC

## 2022-06-23 MED ORDER — PROPOFOL 1000 MG/100ML IV EMUL
INTRAVENOUS | Status: AC
  Filled 2022-06-23: qty 100

## 2022-06-23 MED ORDER — ACETAMINOPHEN 500 MG PO TABS
1000.0000 mg | ORAL_TABLET | Freq: Once | ORAL | Status: AC
Start: 2022-06-23 — End: 2022-06-23
  Administered 2022-06-23: 1000 mg via ORAL

## 2022-06-23 MED ORDER — TRANEXAMIC ACID-NACL 1000-0.7 MG/100ML-% IV SOLN
1000.0000 mg | INTRAVENOUS | Status: AC
  Administered 2022-06-23: 1000 mg via INTRAVENOUS

## 2022-06-23 MED ORDER — MIDAZOLAM HCL 2 MG/2ML IJ SOLN
INTRAMUSCULAR | Status: AC
  Filled 2022-06-23: qty 2

## 2022-06-23 MED ORDER — METOCLOPRAMIDE HCL 5 MG/ML IJ SOLN: 5.0000 mg | Freq: Three times a day (TID) | INTRAMUSCULAR | Status: DC | PRN

## 2022-06-23 MED ORDER — ONDANSETRON HCL 4 MG PO TABS: 4.0000 mg | ORAL_TABLET | Freq: Four times a day (QID) | ORAL | Status: DC | PRN

## 2022-06-23 MED ORDER — ACETAMINOPHEN 500 MG PO TABS
500.0000 mg | ORAL_TABLET | Freq: Four times a day (QID) | ORAL | Status: AC
  Administered 2022-06-23 – 2022-06-24 (×4): 500 mg via ORAL
  Filled 2022-06-23 (×4): qty 1

## 2022-06-23 MED ORDER — FENTANYL CITRATE (PF) 100 MCG/2ML IJ SOLN: 25.0000 ug | INTRAMUSCULAR | Status: DC | PRN

## 2022-06-23 MED ORDER — POTASSIUM CHLORIDE CRYS ER 10 MEQ PO TBCR
10.0000 meq | EXTENDED_RELEASE_TABLET | Freq: Every day | ORAL | Status: DC
  Administered 2022-06-23 – 2022-06-24 (×2): 10 meq via ORAL
  Filled 2022-06-23 (×3): qty 1

## 2022-06-23 MED ORDER — PROPOFOL 10 MG/ML IV BOLUS
INTRAVENOUS | Status: AC
Start: 2022-06-23 — End: ?
  Filled 2022-06-23: qty 20

## 2022-06-23 MED ORDER — BUPIVACAINE HCL (PF) 0.25 % IJ SOLN
INTRAMUSCULAR | Status: AC
  Filled 2022-06-23: qty 30

## 2022-06-23 MED ORDER — CHLORHEXIDINE GLUCONATE 0.12 % MT SOLN
OROMUCOSAL | Status: AC
  Administered 2022-06-23: 15 mL via OROMUCOSAL
  Filled 2022-06-23: qty 15

## 2022-06-23 MED ORDER — BUPIVACAINE IN DEXTROSE 0.75-8.25 % IT SOLN
INTRATHECAL | Status: DC | PRN
  Administered 2022-06-23: 2 mL via INTRATHECAL

## 2022-06-23 MED ORDER — SODIUM CHLORIDE 0.9 % IV SOLN: INTRAVENOUS | Status: AC

## 2022-06-23 MED ORDER — DIPHENHYDRAMINE HCL 25 MG PO CAPS
25.0000 mg | ORAL_CAPSULE | Freq: Every day | ORAL | Status: DC
  Administered 2022-06-23: 25 mg via ORAL
  Filled 2022-06-23: qty 1

## 2022-06-23 MED ORDER — ACETAMINOPHEN 500 MG PO TABS
ORAL_TABLET | ORAL | Status: AC
  Filled 2022-06-23: qty 2

## 2022-06-23 MED ORDER — ADULT MULTIVITAMIN W/MINERALS CH
1.0000 | ORAL_TABLET | Freq: Every day | ORAL | Status: DC
  Administered 2022-06-23 – 2022-06-24 (×2): 1 via ORAL
  Filled 2022-06-23 (×2): qty 1

## 2022-06-23 MED ORDER — FENTANYL CITRATE (PF) 250 MCG/5ML IJ SOLN
INTRAMUSCULAR | Status: AC
  Filled 2022-06-23: qty 5

## 2022-06-23 MED ORDER — 0.9 % SODIUM CHLORIDE (POUR BTL) OPTIME
TOPICAL | Status: DC | PRN
  Administered 2022-06-23: 1000 mL

## 2022-06-23 MED ORDER — ONDANSETRON HCL 4 MG/2ML IJ SOLN: 4.0000 mg | Freq: Four times a day (QID) | INTRAMUSCULAR | Status: DC | PRN

## 2022-06-23 MED ORDER — PHENYLEPHRINE HCL-NACL 20-0.9 MG/250ML-% IV SOLN
INTRAVENOUS | Status: DC | PRN
  Administered 2022-06-23: 40 ug/min via INTRAVENOUS

## 2022-06-23 MED ORDER — TRANEXAMIC ACID 1000 MG/10ML IV SOLN
INTRAVENOUS | Status: DC | PRN
  Administered 2022-06-23: 2000 mg via TOPICAL

## 2022-06-23 MED ORDER — SODIUM CHLORIDE (PF) 0.9 % IJ SOLN
INTRAMUSCULAR | Status: DC | PRN
  Administered 2022-06-23: 60 mL

## 2022-06-23 MED ORDER — DOCUSATE SODIUM 100 MG PO CAPS
100.0000 mg | ORAL_CAPSULE | Freq: Two times a day (BID) | ORAL | Status: DC
  Administered 2022-06-23 – 2022-06-24 (×2): 100 mg via ORAL
  Filled 2022-06-23 (×2): qty 1

## 2022-06-23 MED ORDER — ORAL CARE MOUTH RINSE: 15.0000 mL | Freq: Once | OROMUCOSAL | Status: AC

## 2022-06-23 MED ORDER — ROPIVACAINE HCL 5 MG/ML IJ SOLN
INTRAMUSCULAR | Status: DC | PRN
  Administered 2022-06-23: 20 mL via PERINEURAL

## 2022-06-23 MED ORDER — POVIDONE-IODINE 10 % EX SWAB
2.0000 | Freq: Once | CUTANEOUS | Status: AC
Start: 2022-06-23 — End: 2022-06-23
  Administered 2022-06-23: 2 via TOPICAL

## 2022-06-23 MED ORDER — DEXAMETHASONE SODIUM PHOSPHATE 10 MG/ML IJ SOLN
INTRAMUSCULAR | Status: DC | PRN
  Administered 2022-06-23: 5 mg

## 2022-06-23 MED ORDER — VANCOMYCIN HCL 1000 MG IV SOLR
INTRAVENOUS | Status: DC | PRN
  Administered 2022-06-23: 1000 mg via TOPICAL

## 2022-06-23 MED ORDER — CEFAZOLIN SODIUM-DEXTROSE 2-4 GM/100ML-% IV SOLN
2.0000 g | INTRAVENOUS | Status: AC
Start: 2022-06-23 — End: 2022-06-23
  Administered 2022-06-23: 2 g via INTRAVENOUS

## 2022-06-23 MED ORDER — TRAMADOL HCL 50 MG PO TABS
50.0000 mg | ORAL_TABLET | Freq: Four times a day (QID) | ORAL | Status: DC
  Administered 2022-06-23 – 2022-06-24 (×4): 50 mg via ORAL
  Filled 2022-06-23 (×4): qty 1

## 2022-06-23 MED ORDER — CLONIDINE HCL (ANALGESIA) 100 MCG/ML EP SOLN
EPIDURAL | Status: DC | PRN
  Administered 2022-06-23: 13 mL

## 2022-06-23 MED ORDER — TRANEXAMIC ACID-NACL 1000-0.7 MG/100ML-% IV SOLN
INTRAVENOUS | Status: AC
  Filled 2022-06-23: qty 100

## 2022-06-23 MED ORDER — HYDROCODONE-ACETAMINOPHEN 5-325 MG PO TABS
1.0000 | ORAL_TABLET | ORAL | Status: DC | PRN
  Administered 2022-06-23: 1 via ORAL
  Administered 2022-06-24: 2 via ORAL
  Filled 2022-06-23: qty 2
  Filled 2022-06-23: qty 1

## 2022-06-23 MED ORDER — PHENOL 1.4 % MT LIQD
1.0000 | OROMUCOSAL | Status: DC | PRN
Start: 2022-06-23 — End: 2022-06-24

## 2022-06-23 MED ORDER — ATORVASTATIN CALCIUM 10 MG PO TABS
20.0000 mg | ORAL_TABLET | Freq: Every day | ORAL | Status: DC
Start: 2022-06-23 — End: 2022-06-24
  Administered 2022-06-23: 20 mg via ORAL
  Filled 2022-06-23: qty 2

## 2022-06-23 MED ORDER — ALBUMIN HUMAN 5 % IV SOLN: INTRAVENOUS | Status: DC | PRN

## 2022-06-23 MED ORDER — FENTANYL CITRATE (PF) 100 MCG/2ML IJ SOLN
INTRAMUSCULAR | Status: AC
  Administered 2022-06-23: 50 ug via INTRAVENOUS
  Filled 2022-06-23: qty 2

## 2022-06-23 MED ORDER — PROPOFOL 10 MG/ML IV BOLUS
INTRAVENOUS | Status: DC | PRN
  Administered 2022-06-23: 30 mg via INTRAVENOUS

## 2022-06-23 MED ORDER — METOCLOPRAMIDE HCL 5 MG PO TABS: 5.0000 mg | ORAL_TABLET | Freq: Three times a day (TID) | ORAL | Status: DC | PRN

## 2022-06-23 MED ORDER — DOCUSATE SODIUM 100 MG PO CAPS: 100.0000 mg | ORAL_CAPSULE | Freq: Two times a day (BID) | ORAL | Status: DC

## 2022-06-23 MED ORDER — ONDANSETRON HCL 4 MG/2ML IJ SOLN
INTRAMUSCULAR | Status: DC | PRN
  Administered 2022-06-23: 4 mg via INTRAVENOUS

## 2022-06-23 MED ORDER — TAMSULOSIN HCL 0.4 MG PO CAPS
0.4000 mg | ORAL_CAPSULE | Freq: Every day | ORAL | Status: DC
Start: 2022-06-23 — End: 2022-06-24
  Administered 2022-06-23: 0.4 mg via ORAL
  Filled 2022-06-23: qty 1

## 2022-06-23 MED ORDER — B COMPLEX-C PO TABS
1.0000 | ORAL_TABLET | Freq: Every day | ORAL | Status: DC
  Administered 2022-06-23 – 2022-06-24 (×2): 1 via ORAL
  Filled 2022-06-23 (×2): qty 1

## 2022-06-23 MED ORDER — VANCOMYCIN HCL 1000 MG IV SOLR
INTRAVENOUS | Status: AC
  Filled 2022-06-23: qty 20

## 2022-06-23 MED ORDER — POVIDONE-IODINE 7.5 % EX SOLN
Freq: Once | CUTANEOUS | Status: DC
  Filled 2022-06-23: qty 118

## 2022-06-23 MED ORDER — PROPOFOL 500 MG/50ML IV EMUL
INTRAVENOUS | Status: DC | PRN
  Administered 2022-06-23: 50 ug/kg/min via INTRAVENOUS

## 2022-06-23 MED ORDER — MORPHINE SULFATE (PF) 4 MG/ML IV SOLN
INTRAVENOUS | Status: AC
  Filled 2022-06-23: qty 8

## 2022-06-23 MED ORDER — LACTATED RINGERS IV SOLN: INTRAVENOUS | Status: DC

## 2022-06-23 MED ORDER — CO Q 10 100 MG PO CAPS: 100.0000 mg | ORAL_CAPSULE | Freq: Every day | ORAL | Status: DC

## 2022-06-23 MED ORDER — MORPHINE SULFATE (PF) 2 MG/ML IV SOLN
0.5000 mg | INTRAVENOUS | Status: DC | PRN
  Filled 2022-06-23: qty 1

## 2022-06-23 MED ORDER — APIXABAN 5 MG PO TABS
5.0000 mg | ORAL_TABLET | Freq: Two times a day (BID) | ORAL | Status: DC
  Administered 2022-06-24: 5 mg via ORAL
  Filled 2022-06-23: qty 1

## 2022-06-23 MED ORDER — POVIDONE-IODINE 10 % EX SWAB: 2.0000 | Freq: Once | CUTANEOUS | Status: DC

## 2022-06-23 SURGICAL SUPPLY — 89 items
BAG COUNTER SPONGE SURGICOUNT (BAG) ×2 IMPLANT
BAG DECANTER FOR FLEXI CONT (MISCELLANEOUS) ×2 IMPLANT
BANDAGE ESMARK 6X9 LF (GAUZE/BANDAGES/DRESSINGS) ×1 IMPLANT
BLADE SAG 18X100X1.27 (BLADE) ×2 IMPLANT
BLADE SAGITTAL (BLADE) ×1
BLADE SAW THK.89X75X18XSGTL (BLADE) ×1 IMPLANT
BNDG COHESIVE 6X5 TAN STRL LF (GAUZE/BANDAGES/DRESSINGS) ×2 IMPLANT
BNDG ELASTIC 6X15 VLCR STRL LF (GAUZE/BANDAGES/DRESSINGS) ×2 IMPLANT
BNDG ESMARK 6X9 LF (GAUZE/BANDAGES/DRESSINGS) ×2
BOWL SMART MIX CTS (DISPOSABLE) IMPLANT
CLSR STERI-STRIP ANTIMIC 1/2X4 (GAUZE/BANDAGES/DRESSINGS) ×2 IMPLANT
CNTNR URN SCR LID CUP LEK RST (MISCELLANEOUS) ×1 IMPLANT
COMP FEM PS KNEE STD 10 RT (Joint) ×2 IMPLANT
COMP PATELLAR 10X35 METAL (Joint) ×2 IMPLANT
COMP TIB KNEE PS G 0 RT (Joint) ×2 IMPLANT
COMPONENT FEM PS KN STD 10 RT (Joint) IMPLANT
COMPONENT PATELLAR 10X35 METAL (Joint) IMPLANT
COMPONENT TIB KNEE PS G 0 RT (Joint) IMPLANT
CONT SPEC 4OZ STRL OR WHT (MISCELLANEOUS) ×1
COOLER ICEMAN CLASSIC (MISCELLANEOUS) ×1 IMPLANT
COVER SURGICAL LIGHT HANDLE (MISCELLANEOUS) ×2 IMPLANT
CUFF TOURN SGL QUICK 34 (TOURNIQUET CUFF) ×1
CUFF TOURN SGL QUICK 42 (TOURNIQUET CUFF) IMPLANT
CUFF TRNQT CYL 34X4.125X (TOURNIQUET CUFF) ×1 IMPLANT
DRAPE INCISE IOBAN 66X45 STRL (DRAPES) IMPLANT
DRAPE ORTHO SPLIT 77X108 STRL (DRAPES) ×3
DRAPE SURG ORHT 6 SPLT 77X108 (DRAPES) ×3 IMPLANT
DRAPE U-SHAPE 47X51 STRL (DRAPES) ×2 IMPLANT
DRSG AQUACEL AG ADV 3.5X14 (GAUZE/BANDAGES/DRESSINGS) ×1 IMPLANT
DURAPREP 26ML APPLICATOR (WOUND CARE) ×4 IMPLANT
ELECT CAUTERY BLADE 6.4 (BLADE) ×2 IMPLANT
ELECT REM PT RETURN 9FT ADLT (ELECTROSURGICAL) ×2
ELECTRODE REM PT RTRN 9FT ADLT (ELECTROSURGICAL) ×1 IMPLANT
GAUZE SPONGE 4X4 12PLY STRL (GAUZE/BANDAGES/DRESSINGS) ×2 IMPLANT
GLOVE BIOGEL PI IND STRL 7.0 (GLOVE) ×1 IMPLANT
GLOVE BIOGEL PI IND STRL 8 (GLOVE) ×1 IMPLANT
GLOVE BIOGEL PI INDICATOR 7.0 (GLOVE) ×1
GLOVE BIOGEL PI INDICATOR 8 (GLOVE) ×1
GLOVE ECLIPSE 7.0 STRL STRAW (GLOVE) ×2 IMPLANT
GLOVE ECLIPSE 8.0 STRL XLNG CF (GLOVE) ×2 IMPLANT
GLOVE SURG ENC MOIS LTX SZ6.5 (GLOVE) ×6 IMPLANT
GOWN STRL REUS W/ TWL LRG LVL3 (GOWN DISPOSABLE) ×3 IMPLANT
GOWN STRL REUS W/TWL LRG LVL3 (GOWN DISPOSABLE) ×3
HANDPIECE INTERPULSE COAX TIP (DISPOSABLE) ×1
HDLS TROCR DRIL PIN KNEE 75 (PIN) ×1
HOOD PEEL AWAY FLYTE STAYCOOL (MISCELLANEOUS) ×7 IMPLANT
IMMOBILIZER KNEE 20 (SOFTGOODS)
IMMOBILIZER KNEE 20 THIGH 36 (SOFTGOODS) IMPLANT
IMMOBILIZER KNEE 22 UNIV (SOFTGOODS) IMPLANT
IMMOBILIZER KNEE 24 THIGH 36 (MISCELLANEOUS) IMPLANT
IMMOBILIZER KNEE 24 UNIV (MISCELLANEOUS)
INSERT TIBIAL PERSONA 10 RT (Insert) IMPLANT
JET LAVAGE IRRISEPT WOUND (IRRIGATION / IRRIGATOR) ×2
KIT BASIN OR (CUSTOM PROCEDURE TRAY) ×2 IMPLANT
KIT TURNOVER KIT B (KITS) ×2 IMPLANT
LAVAGE JET IRRISEPT WOUND (IRRIGATION / IRRIGATOR) IMPLANT
MANIFOLD NEPTUNE II (INSTRUMENTS) ×2 IMPLANT
NDL SPNL 18GX3.5 QUINCKE PK (NEEDLE) ×1 IMPLANT
NEEDLE 22X1 1/2 (OR ONLY) (NEEDLE) ×4 IMPLANT
NEEDLE SPNL 18GX3.5 QUINCKE PK (NEEDLE) ×2 IMPLANT
NS IRRIG 1000ML POUR BTL (IV SOLUTION) ×4 IMPLANT
PACK TOTAL JOINT (CUSTOM PROCEDURE TRAY) ×2 IMPLANT
PAD ARMBOARD 7.5X6 YLW CONV (MISCELLANEOUS) ×4 IMPLANT
PAD CAST 4YDX4 CTTN HI CHSV (CAST SUPPLIES) ×1 IMPLANT
PAD COLD SHLDR WRAP-ON (PAD) ×1 IMPLANT
PADDING CAST COTTON 4X4 STRL (CAST SUPPLIES)
PADDING CAST COTTON 6X4 STRL (CAST SUPPLIES) ×1 IMPLANT
PIN DRILL HDLS TROCAR 75 4PK (PIN) IMPLANT
SCREW FEMALE HEX FIX 25X2.5 (ORTHOPEDIC DISPOSABLE SUPPLIES) ×2 IMPLANT
SET HNDPC FAN SPRY TIP SCT (DISPOSABLE) ×1 IMPLANT
SPIKE FLUID TRANSFER (MISCELLANEOUS) ×2 IMPLANT
STRIP CLOSURE SKIN 1/2X4 (GAUZE/BANDAGES/DRESSINGS) ×2 IMPLANT
SUCTION FRAZIER HANDLE 10FR (MISCELLANEOUS) ×1
SUCTION TUBE FRAZIER 10FR DISP (MISCELLANEOUS) ×1 IMPLANT
SUT MNCRL AB 3-0 PS2 18 (SUTURE) ×1 IMPLANT
SUT VIC AB 0 CT1 27 (SUTURE) ×6
SUT VIC AB 0 CT1 27XBRD ANBCTR (SUTURE) ×3 IMPLANT
SUT VIC AB 1 CT1 36 (SUTURE) ×9 IMPLANT
SUT VIC AB 2-0 CT1 27 (SUTURE) ×7
SUT VIC AB 2-0 CT1 TAPERPNT 27 (SUTURE) ×4 IMPLANT
SYR 30ML LL (SYRINGE) ×6 IMPLANT
SYR TB 1ML LUER SLIP (SYRINGE) ×2 IMPLANT
TIBIAL INSERT PERSONA 10 RT (Insert) ×2 IMPLANT
TOWEL GREEN STERILE (TOWEL DISPOSABLE) ×4 IMPLANT
TOWEL GREEN STERILE FF (TOWEL DISPOSABLE) ×4 IMPLANT
TRAY CATH 16FR W/PLASTIC CATH (SET/KITS/TRAYS/PACK) IMPLANT
TRAY CATH INTERMITTENT SS 16FR (CATHETERS) ×1 IMPLANT
WATER STERILE IRR 1000ML POUR (IV SOLUTION) IMPLANT
YANKAUER SUCT BULB TIP NO VENT (SUCTIONS) ×2 IMPLANT

## 2022-06-23 NOTE — Progress Notes (Signed)
Pt set up w/home mask on dreamstation. Pt will self admin when ready. RT will cont to monitor as needed.

## 2022-06-23 NOTE — Progress Notes (Signed)
Orthopedic Tech Progress Note Patient Details:  Harry Kline Mar 27, 1934 [id number]  Ortho Devices Type of Ortho Device: Bone foam zero knee Ortho Device/Splint Location: RLE Ortho Device/Splint Interventions: Ordered, Application, Adjustment   Post Interventions Patient Tolerated: Well  [name] [name] 06/23/2022, 4:00 PM

## 2022-06-23 NOTE — Anesthesia Procedure Notes (Addendum)
Procedure Name: MAC Date/Time: 06/23/2022 12:12 PM  Performed by: Michele Rockers, CRNAPre-anesthesia Checklist: Patient identified, Emergency Drugs available, Suction available, Timeout performed and Patient being monitored Patient Re-evaluated:Patient Re-evaluated prior to induction Oxygen Delivery Method: Simple face mask

## 2022-06-23 NOTE — Transfer of Care (Signed)
Immediate Anesthesia Transfer of Care Note  Patient: Harry Kline  Procedure(s) Performed: RIGHT TOTAL KNEE ARTHROPLASTY (Right: Knee)  Patient Location: PACU  Anesthesia Type:MAC combined with regional for post-op pain  Level of Consciousness: awake, alert  and oriented  Airway & Oxygen Therapy: Patient Spontanous Breathing  Post-op Assessment: Report given to RN and Post -op Vital signs reviewed and stable  Post vital signs: Reviewed and stable  Last Vitals:  Vitals Value Taken Time  BP 89/72 06/23/22 1515  Temp    Pulse 52 06/23/22 1515  Resp 15 06/23/22 1515  SpO2 96 % 06/23/22 1515  Vitals shown include unvalidated device data.  Last Pain:  Vitals:   06/23/22 0947  PainSc: 0-No pain         Complications: No notable events documented.

## 2022-06-23 NOTE — Progress Notes (Signed)
PHARMACIST - PHYSICIAN ORDER COMMUNICATION  CONCERNING: P&T Medication Policy on Herbal Medications  DESCRIPTION:  This patient's order for:  CoQ 10  has been noted.  This product(s) is classified as an "herbal" or natural product. Due to a lack of definitive safety studies or FDA approval, nonstandard manufacturing practices, plus the potential risk of unknown drug-drug interactions while on inpatient medications, the Pharmacy and Therapeutics Committee does not permit the use of "herbal" or natural products of this type within [facility].   ACTION TAKEN: The pharmacy department is unable to verify this order at this time and your patient has been informed of this safety policy. Please reevaluate patient's clinical condition at discharge and address if the herbal or natural product(s) should be resumed at that time.   Titus Dubin, PharmD PGY1 Pharmacy Resident 06/23/2022 4:36 PM

## 2022-06-23 NOTE — Plan of Care (Signed)

## 2022-06-23 NOTE — Anesthesia Procedure Notes (Signed)
Anesthesia Regional Block: Adductor canal block   Pre-Anesthetic Checklist: , timeout performed,  Correct Patient, Correct Site, Correct Laterality,  Correct Procedure, Correct Position, site marked,  Risks and benefits discussed,  Pre-op evaluation,  At surgeon's request and post-op pain management  Laterality: Right  Prep: Maximum Sterile Barrier Precautions used, chloraprep       Needles:  Injection technique: Single-shot  Needle Type: Echogenic Stimulator Needle     Needle Length: 9cm  Needle Gauge: 21     Additional Needles:   Procedures:,,,, ultrasound used (permanent image in chart),,    Narrative:  Start time: 06/23/2022 11:43 AM End time: 06/23/2022 11:45 AM Injection made incrementally with aspirations every 5 mL. Anesthesiologist: Freddrick March, MD

## 2022-06-23 NOTE — Op Note (Unsigned)
NAME: Harry Kline, Harry Kline MEDICAL RECORD NO: [id number] ACCOUNT NO: [id number] DATE OF BIRTH: [birth year] FACILITY: [facility] LOCATION: [facility] PHYSICIAN: Yetta Barre. Marlou Sa, MD  Operative Report   DATE OF PROCEDURE: 06/23/2022  PREOPERATIVE DIAGNOSIS:  Right knee arthritis.  POSTOPERATIVE DIAGNOSIS:  Right knee arthritis.  PROCEDURE:  Right total knee replacement using Persona press-fit cruciate retaining personalized knee system, size G right tibia with press-fit keel, size 35 mm press-fit patella, medial congruent size 10, highly cross-linked polyethylene and size 10  standard femur, posterior cruciate retaining.  SURGEON:  Yetta Barre. Marlou Sa, MD.  ASSISTANT:  April Nyoka Cowden, RNFA.  INDICATIONS:  The patient is an 88 year old patient with right knee pain refractory to nonoperative management, who presents for operative management after explanation of risks and benefits of the procedure.  DESCRIPTION OF PROCEDURE:  The patient was brought to the operating room where spinal anesthetic was induced.  Preoperative antibiotics administered.  Timeout was called.  Right leg was pre-scrubbed with alcohol, Betadine, allowed to air dry, prepped  DuraPrep solution and draped in sterile manner.  Preoperative varus deformity was present with about 10-15 degree flexion contracture.  After sterile prepping and draping, Ioban used to cover the operative field.  Timeout was called.  Leg was elevated,  tourniquet was inflated.  Anterior approach to the knee was made.  Skin and subcutaneous tissue were sharply divided.  IrriSept solution utilized.  Median parapatellar arthrotomy was made and marked with #1 Vicryl suture.  Fat pad was partially excised.   Medial soft tissue dissection was performed proportional to his preoperative varus deformity.  Lateral patellofemoral ligament was released.  Soft tissue removed from the anterior distal femur.  Patella was everted, knee was flexed.  Anterior horn  lateral of the meniscus  removed.  Hohmann retractor placed.  G-retractor placed. The posterior retractor was placed and the PCL was partially released.  Next, the intramedullary alignment was used to make a cut perpendicular to the mechanical axis.   Initially, the cut was measured 2 mm off the most affected medial tibial plateau.  This had to be revised 6 more millimeters in order to get a 10 poly.  This had to be revised for 3 more cuts in order to get a 10 poly to seat.  Femur was initially cut at  10 mm and later revised to 12 mm.  With those cuts performed, we were able to get a 10 mm spacer in position with good stability in extension to varus and valgus stress.  Femur was sized to a size 10.  Anterior, posterior and chamfer cuts were made 3  degrees of external rotation.  Intramedullary alignment was also used to make the distal femoral cut.  Bone quality was that of a 88 year old.  It was excellent bone quality.  Tibia was then appropriately sized and rotated to the medial third of the  tibial tubercle. Femur trial was placed.  Tibial baseplate was placed.  The patient had very good stability to varus and valgus stress with a 10 mm spacer.  Patella was then cut down from 26 to 16 and a 10 mm patellar trial button was placed.  With trial  components in position, the patient had full extension, full flexion, excellent stability to varus and valgus stress at 0, 30 and 90 degrees and excellent patellar tracking using no thumbs technique.  Trial components were removed.  Thorough irrigation  performed with 3 liters of pulsatile irrigation.  Capsule anesthetized using a combination of Marcaine, saline and Exparel.  TXA sponge was allowed to sit within the knee for 3 minutes along with IrriSept solution.  Then vancomycin powder placed into the  tibial canal. True components placed with same stability parameters maintained.  Tourniquet released.  Bleeding points encountered were controlled using electrocautery.  Thorough irrigation  was performed with pouring irrigation.  Arthrotomy was then  closed using #1 Vicryl suture.  Final solution of IrriSept solution, vancomycin powder placed into the arthrotomy prior to final closure.  Then, it was closed using 0 Vicryl suture, 2-0 Vicryl suture, and 3-0 Monocryl with Steri-Strips.  Aquacel  dressing, Ace wrap, iceman and knee immobilizer placed.  The patient tolerated the procedure well without immediate complication and transferred to recovery room in stable condition.  It should also be noted that prior to closure of the skin and through  the arthrotomy that was closed, we did inject Marcaine, morphine, clonidine for extra pain relief.  The patient transferred to the recovery room in stable condition.   MUK D: 06/23/2022 3:11:06 pm T: 06/23/2022 9:31:00 pm  JOB: [id number]/ [id number]

## 2022-06-23 NOTE — Brief Op Note (Signed)
   06/23/2022  3:05 PM  PATIENT:  Donnie Panik  88 y.o. male  PRE-OPERATIVE DIAGNOSIS:  right knee osteoarthritis  POST-OPERATIVE DIAGNOSIS:  right knee osteoarthritis  PROCEDURE:  Procedure(s): RIGHT TOTAL KNEE ARTHROPLASTY  SURGEON:  Surgeon(s): Meredith Pel, MD  ASSISTANT: green rnfa  ANESTHESIA:   spinal  EBL: 100 ml    Total I/O In: 350 [IV Piggyback:350] Out: -   BLOOD ADMINISTERED: none  DRAINS: none   LOCAL MEDICATIONS USED:  marcaine mso4 clonidine exparel vanco powder  SPECIMEN:  No Specimen  COUNTS:  YES  TOURNIQUET:   Total Tourniquet Time Documented: Thigh (Right) - 96 minutes Total: Thigh (Right) - 96 minutes   DICTATION: .Other Dictation: Dictation Number [id number]  PLAN OF CARE: Admit for overnight observation  PATIENT DISPOSITION:  PACU - hemodynamically stable

## 2022-06-23 NOTE — H&P (Signed)
TOTAL KNEE ADMISSION H&P  Patient is being admitted for right total knee arthroplasty.  Subjective:  Chief Complaint:right knee pain.  HPI: Harry Kline, 88 y.o. male, has a history of pain and functional disability in the right knee due to arthritis and has failed non-surgical conservative treatments for greater than 12 weeks to includeNSAID's and/or analgesics, corticosteriod injections, viscosupplementation injections, flexibility and strengthening excercises, use of assistive devices, and activity modification.  Onset of symptoms was gradual, starting >10 years ago with gradually worsening course since that time. The patient noted no past surgery on the right knee(s).  Patient currently rates pain in the right knee(s) at 8 out of 10 with activity. Patient has night pain, worsening of pain with activity and weight bearing, pain that interferes with activities of daily living, pain with passive range of motion, crepitus, and joint swelling.  Patient has evidence of subchondral sclerosis and joint space narrowing by imaging studies. This patient has had . There is no active infection.  Cardiac risk stratification has been performed.  Medications have been adjusted appropriately.  Plan to restart Eliquis tonight or tomorrow morning  Patient Active Problem List   Diagnosis Date Noted   History of squamous cell carcinoma 09/09/2020   Pacemaker 02/19/2020   History of lumbar fusion 02/19/2020   Hyperlipidemia 02/19/2020   BPH (benign prostatic hyperplasia) 02/19/2020   Hypertension 02/19/2020   Atrial flutter ([place]) 02/19/2020   Paroxysmal atrial fibrillation ([place]) 02/19/2020   Mitral valve regurgitation 02/19/2020   First degree AV block 02/19/2020   Anxiety 02/19/2020   Obesity (BMI 30.0-34.9) 02/19/2020   Insomnia 02/19/2020   Past Medical History:  Diagnosis Date   Arthritis 2010   Diagnosed but not enough to treat with medication.   Atrial fibrillation (HCC)    Atrial fibrillation  (HCC)    CAD (coronary artery disease)    Cataract 2009   surgery both eyes   GERD (gastroesophageal reflux disease) 2000   Hyperlipidemia    Hypertension    OSA (obstructive sleep apnea)    Pacemaker    [employer]onic-   Skin cancer    Sleep apnea 1990   c-pap machine    Past Surgical History:  Procedure Laterality Date   CORONARY ARTERY BYPASS GRAFT     November 2003   [place]   RK surgery   SPINE SURGERY  2014   L4,L5 fusion   TONSILLECTOMY      Current Facility-Administered Medications  Medication Dose Route Frequency Provider Last Rate Last Admin   acetaminophen (TYLENOL) 500 MG tablet            ceFAZolin (ANCEF) 2-4 GM/100ML-% IVPB            ceFAZolin (ANCEF) IVPB 2g/100 mL premix  2 g Intravenous On Call to OR Magnant, Charles L, PA-C       fentaNYL (SUBLIMAZE) 100 MCG/2ML injection            lactated ringers infusion   Intravenous Continuous Stoltzfus, Maayan Jenning P, DO 10 mL/hr at 06/23/22 1140 Continued from Pre-op at 06/23/22 1140   midazolam (VERSED) 2 MG/2ML injection            povidone-iodine (BETADINE) 7.5 % scrub   Topical Once Magnant, Charles L, PA-C       povidone-iodine 10 % swab 2 Application  2 Application Topical Once Magnant, Charles L, PA-C       tranexamic acid (CYKLOKAPRON) '1000MG'$ /125m IVPB  tranexamic acid (CYKLOKAPRON) 2,000 mg in sodium chloride 0.9 % 50 mL Topical Application  6,160 mg Topical To OR Marlou Sa Tonna Corner, MD       tranexamic acid (CYKLOKAPRON) IVPB 1,000 mg  1,000 mg Intravenous To OR Magnant, Charles L, PA-C       No Known Allergies  Social History   Tobacco Use   Smoking status: Former    Packs/day: 1.00    Years: 20.00    Total pack years: 20.00    Types: Cigarettes    Quit date: 11/30/1973    Years since quitting: 48.5   Smokeless tobacco: Never  Substance Use Topics   Alcohol use: Yes    Alcohol/week: 2.0 standard drinks of alcohol    Types: 2 Cans of beer per week    Comment: Occasional wine or  liquor    Family History  Problem Relation Age of Onset   Breast cancer Mother    Cancer Mother    Heart failure Father    Heart disease Father    Healthy Sister    Diabetes Paternal Grandmother    Heart failure Paternal Grandfather    Heart disease Paternal Grandfather      Review of Systems  Musculoskeletal:  Positive for arthralgias.  All other systems reviewed and are negative.   Objective:  Physical Exam Vitals reviewed.  HENT:     Head: Normocephalic.     Nose: Nose normal.     Mouth/Throat:     Mouth: Mucous membranes are moist.  Eyes:     Pupils: Pupils are equal, round, and reactive to light.  Cardiovascular:     Rate and Rhythm: Normal rate.     Pulses: Normal pulses.  Pulmonary:     Effort: Pulmonary effort is normal.  Abdominal:     General: Abdomen is flat.  Musculoskeletal:     Cervical back: Normal range of motion.  Skin:    General: Skin is warm.  Neurological:     General: No focal deficit present.     Mental Status: He is alert.  Psychiatric:        Mood and Affect: Mood normal.    Ortho exam demonstrates 15 degree flexion contracture on the right with varus alignment.  Pedal pulses palpable.  Ankle dorsiflexion intact.  No groin pain with internal/external rotation of the leg.  No other masses lymphadenopathy or skin changes noted in that knee region. Vital signs in last 24 hours: Temp:  [98.3 F (36.8 C)] 98.3 F (36.8 C) (07/25 0938) Pulse Rate:  [61] 61 (07/25 0938) Resp:  [17] 17 (07/25 0938) BP: (135)/(65) 135/65 (07/25 0938) SpO2:  [93 %] 93 % (07/25 0938) Weight:  [102.1 kg] 102.1 kg (07/25 0938)  Labs:   Estimated body mass index is 32.28 kg/m as calculated from the following:   Height as of this encounter: '5\' 10"'$  (1.778 m).   Weight as of this encounter: 102.1 kg.   Imaging Review Plain radiographs demonstrate severe degenerative joint disease of the right knee(s). The overall alignment ismild varus. The bone quality  appears to be good for age and reported activity level.      Assessment/Plan:  End stage arthritis, right knee   The patient history, physical examination, clinical judgment of the provider and imaging studies are consistent with end stage degenerative joint disease of the right knee(s) and total knee arthroplasty is deemed medically necessary. The treatment options including medical management, injection therapy arthroscopy and arthroplasty were discussed at  length. The risks and benefits of total knee arthroplasty were presented and reviewed. The risks due to aseptic loosening, infection, stiffness, patella tracking problems, thromboembolic complications and other imponderables were discussed. The patient acknowledged the explanation, agreed to proceed with the plan and consent was signed. Patient is being admitted for inpatient treatment for surgery, pain control, PT, OT, prophylactic antibiotics, VTE prophylaxis, progressive ambulation and ADL's and discharge planning. The patient is planning to be discharged home with home health services     Patient's anticipated LOS is less than 2 midnights, meeting these requirements: - Younger than 60 - Lives within 1 hour of care - Has a competent adult at home to recover with post-op recover - NO history of  - Chronic pain requiring opiods  - Diabetes  - Coronary Artery Disease  - Heart failure  - Heart attack  - Stroke  - DVT/VTE  - Cardiac arrhythmia  - Respiratory Failure/COPD  - Renal failure  - Anemia  - Advanced Liver disease

## 2022-06-23 NOTE — Anesthesia Procedure Notes (Signed)
Spinal  Patient location during procedure: OR Start time: 06/23/2022 12:20 PM End time: 06/23/2022 12:22 PM Reason for block: surgical anesthesia Staffing Performed: anesthesiologist  Anesthesiologist: Freddrick March, MD Performed by: Freddrick March, MD Authorized by: Suzette Battiest, MD   Preanesthetic Checklist Completed: patient identified, IV checked, risks and benefits discussed, surgical consent, monitors and equipment checked, pre-op evaluation and timeout performed Spinal Block Patient position: sitting Prep: DuraPrep and site prepped and draped Patient monitoring: cardiac monitor, continuous pulse ox and blood pressure Approach: midline Location: L3-4 Injection technique: single-shot Needle Needle type: Pencan  Needle gauge: 24 G Needle length: 9 cm Assessment Sensory level: T6 Events: CSF return Additional Notes Functioning IV was confirmed and monitors were applied. Sterile prep and drape, including hand hygiene and sterile gloves were used. The patient was positioned and the spine was prepped. The skin was anesthetized with lidocaine.  Free flow of clear CSF was obtained prior to injecting local anesthetic into the CSF.  The spinal needle aspirated freely following injection.  The needle was carefully withdrawn.  The patient tolerated the procedure well.

## 2022-06-24 ENCOUNTER — Other Ambulatory Visit: Payer: Self-pay

## 2022-06-24 DIAGNOSIS — Z87891 Personal history of nicotine dependence: Secondary | ICD-10-CM | POA: Diagnosis not present

## 2022-06-24 DIAGNOSIS — Z950 Presence of cardiac pacemaker: Secondary | ICD-10-CM | POA: Diagnosis not present

## 2022-06-24 DIAGNOSIS — Z96651 Presence of right artificial knee joint: Secondary | ICD-10-CM | POA: Diagnosis not present

## 2022-06-24 DIAGNOSIS — M1711 Unilateral primary osteoarthritis, right knee: Secondary | ICD-10-CM | POA: Diagnosis not present

## 2022-06-24 DIAGNOSIS — Z85828 Personal history of other malignant neoplasm of skin: Secondary | ICD-10-CM | POA: Diagnosis not present

## 2022-06-24 DIAGNOSIS — Z79899 Other long term (current) drug therapy: Secondary | ICD-10-CM | POA: Diagnosis not present

## 2022-06-24 DIAGNOSIS — I10 Essential (primary) hypertension: Secondary | ICD-10-CM | POA: Diagnosis not present

## 2022-06-24 DIAGNOSIS — I2510 Atherosclerotic heart disease of native coronary artery without angina pectoris: Secondary | ICD-10-CM | POA: Diagnosis not present

## 2022-06-24 DIAGNOSIS — Z951 Presence of aortocoronary bypass graft: Secondary | ICD-10-CM | POA: Diagnosis not present

## 2022-06-24 MED ORDER — HYDROCODONE-ACETAMINOPHEN 5-325 MG PO TABS: 1.0000 | ORAL_TABLET | ORAL | 0 refills | Status: DC | PRN

## 2022-06-24 MED ORDER — METHOCARBAMOL 500 MG PO TABS: 500.0000 mg | ORAL_TABLET | Freq: Three times a day (TID) | ORAL | 0 refills | Status: DC | PRN

## 2022-06-24 NOTE — Progress Notes (Signed)
  Subjective: Patient stable.  Pain controlled.   Objective: Vital signs in last 24 hours: Temp:  [97.2 F (36.2 C)-98.5 F (36.9 C)] 98.1 F (36.7 C) (07/26 0420) Pulse Rate:  [50-63] 60 (07/26 0420) Resp:  [11-18] 18 (07/26 0420) BP: (89-144)/(55-72) 115/62 (07/26 0420) SpO2:  [93 %-100 %] 95 % (07/26 0420) Weight:  [102.1 kg] 102.1 kg (07/25 0938)  Intake/Output from previous day: 07/25 0701 - 07/26 0700 In: 1250 [I.V.:900; IV Piggyback:350] Out: 605 [Urine:600; Blood:5] Intake/Output this shift: No intake/output data recorded.  Exam:  Sensation intact distally Intact pulses distally Dorsiflexion/Plantar flexion intact  Labs: No results for input(s): "HGB" in the last 72 hours. No results for input(s): "WBC", "RBC", "HCT", "PLT" in the last 72 hours. No results for input(s): "NA", "K", "CL", "CO2", "BUN", "CREATININE", "GLUCOSE", "CALCIUM" in the last 72 hours. No results for input(s): "LABPT", "INR" in the last 72 hours.  Assessment/Plan: Plan at this time is patient is doing well.  Sitting up in bed and ready to do physical therapy with discharge likely today either after the morning session of therapy or after the afternoon session of therapy.  Back on Eliquis for DVT prophylaxis.  Okay for weightbearing as tolerated   G Alphonzo Severance 06/24/2022, 7:56 AM

## 2022-06-24 NOTE — Anesthesia Postprocedure Evaluation (Signed)
Anesthesia Post Note  Patient: Harry Kline  Procedure(s) Performed: RIGHT TOTAL KNEE ARTHROPLASTY (Right: Knee)     Patient location during evaluation: PACU Anesthesia Type: Spinal Level of consciousness: awake and alert Pain management: pain level controlled Vital Signs Assessment: post-procedure vital signs reviewed and stable Respiratory status: spontaneous breathing and respiratory function stable Cardiovascular status: blood pressure returned to baseline and stable Postop Assessment: spinal receding Anesthetic complications: no   No notable events documented.  Last Vitals:  Vitals:   06/24/22 0420 06/24/22 0803  BP: 115/62 (!) 114/54  Pulse: 60 (!) 54  Resp: 18   Temp: 36.7 C 36.9 C  SpO2: 95% 97%    Last Pain:  Vitals:   06/24/22 0803  TempSrc: Oral  PainSc:                  Tiajuana Amass

## 2022-06-24 NOTE — TOC Initial Note (Signed)
Transition of Care [facility]) - Initial/Assessment Note    Patient Details  Name: Harry Kline MRN: [id number] Date of Birth: 06-15-1934  [facility] CM/SW Contact:    Sharin Mons, RN Phone Number: 06/24/2022, 9:48 AM  Clinical Narrative:              - s/p R TKR, 7/25        NCM spoke with pt @ bedside regarding d/c planning. Pt states lives alone. Has supportive family. States son to assist with care once d/c ( son has taken off work to be with pt).  Pt without DME needs. States already has W/C, RW and CPM @ home. Home health  HHPT services prearranged with [facility] by provider's office.  Per MD will likely d/c today after morning or afternoon therapy.  Pt without Rx med concerns. Son to provide transportation to home once d/c ready. Post hospital f/u noted on AVS.  TOC team monitoring and will assist with needs...  Expected Discharge Plan: [place] Barriers to Discharge: Continued Medical Work up   Patient Goals and CMS Choice     Choice offered to / list presented to : Patient  Expected Discharge Plan and Services Expected Discharge Plan: [place]   Discharge Planning Services: CM Consult   Living arrangements for the past 2 months: Single Family Home                           HH Arranged: PT HH Agency: [facility] Date [place]: 06/24/22 Time [place]: [phone] Representative spoke with at [place]: Marjory Lies, ( made aware of potential d/c for today)  Prior Living Arrangements/Services Living arrangements for the past 2 months: Single Family Home Lives with:: Self Patient language and need for interpreter reviewed:: Yes Do you feel safe going back to the place where you live?: Yes      Need for Family Participation in Patient Care: Yes (Comment) Care giver support system in place?: Yes (comment)   Criminal Activity/Legal Involvement Pertinent to Current  Situation/Hospitalization: No - Comment as needed  Activities of Daily Living Home Assistive Devices/Equipment: Eyeglasses, Blood pressure cuff, Shower chair without back, Cane (specify quad or straight) ADL Screening (condition at time of admission) Patient's cognitive ability adequate to safely complete daily activities?: Yes Is the patient deaf or have difficulty hearing?: No Does the patient have difficulty seeing, even when wearing glasses/contacts?: No Does the patient have difficulty concentrating, remembering, or making decisions?: No Patient able to express need for assistance with ADLs?: Yes Does the patient have difficulty dressing or bathing?: No Independently performs ADLs?: Yes (appropriate for developmental age) Does the patient have difficulty walking or climbing stairs?: No Weakness of Legs: None Weakness of Arms/Hands: None  Permission Sought/Granted                  Emotional Assessment Appearance:: Appears stated age Attitude/Demeanor/Rapport: Engaged Affect (typically observed): Accepting Orientation: : Oriented to Self, Oriented to  Time, Oriented to Place, Oriented to Situation Alcohol / Substance Use: Not Applicable Psych Involvement: No (comment)  Admission diagnosis:  Arthritis of knee [M17.10] OA (osteoarthritis) of knee [M17.9] Patient Active Problem List   Diagnosis Date Noted   Arthritis of knee 06/23/2022   OA (osteoarthritis) of knee 06/23/2022   History of squamous cell carcinoma 09/09/2020   Pacemaker 02/19/2020   History of lumbar fusion 02/19/2020  Hyperlipidemia 02/19/2020   BPH (benign prostatic hyperplasia) 02/19/2020   Hypertension 02/19/2020   Atrial flutter ([place]) 02/19/2020   Paroxysmal atrial fibrillation ([place]) 02/19/2020   Mitral valve regurgitation 02/19/2020   First degree AV block 02/19/2020   Anxiety 02/19/2020   Obesity (BMI 30.0-34.9) 02/19/2020   Insomnia 02/19/2020   PCP:  Luetta Nutting, DO Pharmacy:    [facility], [address] [place] [address] [place] [address] Phone: [phone] Fax: [place], [place] [address] [place] [address] Phone: [phone] Fax: [phone]     Social Determinants of Health (SDOH) Interventions    Readmission Risk Interventions     No data to display

## 2022-06-24 NOTE — Evaluation (Signed)
Physical Therapy Evaluation Patient Details Name: Harry Kline MRN: [id number] DOB: 1934-10-23 Today's Date: 06/24/2022  History of Present Illness  Pt is 88 yo male admitted 06/23/22 for R TKA.  Pt with hx including but not limited to pacemaker, HLD, lumbar fusion, arthritis.  Clinical Impression  Pt is s/p TKA resulting in the deficits listed below (see PT Problem List). At baseline, pt very active and independent.  He is very motivated.  Pt with excellent pain control, ROM (0 to 85 degrees), quad activation, and mobility.  He ambulated 150' and performed safe stairs. Pt demonstrates safe gait & transfers in order to return home from PT perspective once discharged by MD.  While in hospital, will continue to benefit from PT for skilled therapy to advance mobility and exercises.            Recommendations for follow up therapy are one component of a multi-disciplinary discharge planning process, led by the attending physician.  Recommendations may be updated based on patient status, additional functional criteria and insurance authorization.  Follow Up Recommendations Follow physician's recommendations for discharge plan and follow up therapies      Assistance Recommended at Discharge Intermittent Supervision/Assistance  Patient can return home with the following  Help with stairs or ramp for entrance;A little help with bathing/dressing/bathroom;Assistance with cooking/housework    Equipment Recommendations None recommended by PT  Recommendations for Other Services       Functional Status Assessment Patient has had a recent decline in their functional status and demonstrates the ability to make significant improvements in function in a reasonable and predictable amount of time.     Precautions / Restrictions Precautions Precautions: Fall Required Braces or Orthoses: Knee Immobilizer - Right Knee Immobilizer - Right: Discontinue once straight leg raise with < 10 degree  lag Restrictions Other Position/Activity Restrictions: WBAT      Mobility  Bed Mobility Overal bed mobility: Needs Assistance Bed Mobility: Supine to Sit, Sit to Supine     Supine to sit: Modified independent (Device/Increase time) Sit to supine: Modified independent (Device/Increase time)        Transfers Overall transfer level: Needs assistance Equipment used: Rolling walker (2 wheels) Transfers: Sit to/from Stand Sit to Stand: Supervision           General transfer comment: educated on hand placment and R LE management    Ambulation/Gait Ambulation/Gait assistance: Min guard, Supervision Gait Distance (Feet): 150 Feet Assistive device: Rolling walker (2 wheels) Gait Pattern/deviations: Step-through pattern, Decreased stride length Gait velocity: decreased     General Gait Details: Min guard progressed to supervision.  Pt with step through pattern but educated on slightly decreased stride length for stability  Stairs Stairs: Yes Stairs assistance: Min guard Stair Management: With walker, Forwards Number of Stairs: 1 General stair comments: Threshold type step without difficulty  Wheelchair Mobility    Modified Rankin (Stroke Patients Only)       Balance Overall balance assessment: Needs assistance Sitting-balance support: No upper extremity supported Sitting balance-Leahy Scale: Normal     Standing balance support: Bilateral upper extremity supported, Reliant on assistive device for balance, No upper extremity supported Standing balance-Leahy Scale: Fair Standing balance comment: Steady with RW for mobility; could lift hands statically                             Pertinent Vitals/Pain Pain Assessment Pain Assessment: 0-10 Pain Score: 2  (resting) Pain Location: R knee Pain  Descriptors / Indicators: Aching, Sore Pain Intervention(s): Limited activity within patient's tolerance, Monitored during session, Repositioned    Home  Living Family/patient expects to be discharged to:: Private residence Living Arrangements: Alone Available Help at Discharge: Family;Available 24 hours/day (Son staying with him this week) Type of Home: House Home Access: Other (comment) (threshold to enter)       Home Layout: One level Home Equipment: Grab bars - tub/shower;Shower Land (2 wheels);Cane - single point;Wheelchair - manual      Prior Function Prior Level of Function : Independent/Modified Independent             Mobility Comments: Goes to [employer] couple times a week; walks 2-3 miles/day ADLs Comments: independent     Hand Dominance        Extremity/Trunk Assessment   Upper Extremity Assessment Upper Extremity Assessment: Overall WFL for tasks assessed    Lower Extremity Assessment Lower Extremity Assessment: LLE deficits/detail;RLE deficits/detail RLE Deficits / Details: ROM: knee 0 to 85 degrees; MMT ankle 5/5, knee and hip 3/5 not further tested LLE Deficits / Details: ROM WFL; MMT 5/5    Cervical / Trunk Assessment Cervical / Trunk Assessment: Normal  Communication      Cognition Arousal/Alertness: Awake/alert Behavior During Therapy: WFL for tasks assessed/performed Overall Cognitive Status: Within Functional Limits for tasks assessed                                          General Comments General comments (skin integrity, edema, etc.): Educated on safe ice use, resting with leg straight, HEP, and safe mobility    Exercises Total Joint Exercises Ankle Circles/Pumps: AROM, Both, 5 reps, Seated Quad Sets: AROM, Right, 5 reps, Seated Heel Slides: AROM, Right, 5 reps, Supine Hip ABduction/ADduction: AROM, Right, 5 reps, Supine Long Arc Quad: AROM, Right, 5 reps, Supine Knee Flexion: AROM, Right, 5 reps, Supine Goniometric ROM: R knee 0 to 85   Assessment/Plan    PT Assessment Patient needs continued PT services  PT Problem List Decreased  strength;Decreased mobility;Decreased range of motion;Decreased balance;Decreased knowledge of use of DME;Pain       PT Treatment Interventions DME instruction;Therapeutic activities;Gait training;Therapeutic exercise;Patient/family education;Modalities;Stair training;Functional mobility training;Balance training    PT Goals (Current goals can be found in the Care Plan section)  Acute Rehab PT Goals Patient Stated Goal: return home PT Goal Formulation: With patient/family Time For Goal Achievement: 07/08/22 Potential to Achieve Goals: Good    Frequency 7X/week     Co-evaluation               AM-PAC PT "6 Clicks" Mobility  Outcome Measure Help needed turning from your back to your side while in a flat bed without using bedrails?: None Help needed moving from lying on your back to sitting on the side of a flat bed without using bedrails?: None Help needed moving to and from a bed to a chair (including a wheelchair)?: A Little Help needed standing up from a chair using your arms (e.g., wheelchair or bedside chair)?: A Little Help needed to walk in hospital room?: A Little Help needed climbing 3-5 steps with a railing? : A Little 6 Click Score: 20    End of Session Equipment Utilized During Treatment: Gait belt Activity Tolerance: Patient tolerated treatment well Patient left: in chair;with call bell/phone within reach;with family/visitor present (ice cuff with pillowcase) Nurse Communication:  Mobility status PT Visit Diagnosis: Other abnormalities of gait and mobility (R26.89);Muscle weakness (generalized) (M62.81)    Time: 3016-0109 PT Time Calculation (min) (ACUTE ONLY): 39 min   Charges:   PT Evaluation $PT Eval Low Complexity: 1 Low PT Treatments $Gait Training: 8-22 mins $Therapeutic Exercise: 8-22 mins        Abran Richard, PT Acute Rehab [facility] Rehab [phone]   Karlton Lemon 06/24/2022, 11:19 AM

## 2022-06-24 NOTE — Progress Notes (Signed)
Patient discharged to home, AVS reviewed. Patient is aware of follow up appointments. Prescriptions sent electronically to his pharmacy. IV removed. Volunteers assisted patient to the exit, family provided transportation.

## 2022-06-25 ENCOUNTER — Encounter (HOSPITAL_COMMUNITY): Payer: Self-pay | Admitting: Orthopedic Surgery

## 2022-06-25 ENCOUNTER — Telehealth: Payer: Self-pay | Admitting: Orthopedic Surgery

## 2022-06-25 DIAGNOSIS — G4700 Insomnia, unspecified: Secondary | ICD-10-CM | POA: Diagnosis not present

## 2022-06-25 DIAGNOSIS — K219 Gastro-esophageal reflux disease without esophagitis: Secondary | ICD-10-CM | POA: Diagnosis not present

## 2022-06-25 DIAGNOSIS — F419 Anxiety disorder, unspecified: Secondary | ICD-10-CM | POA: Diagnosis not present

## 2022-06-25 DIAGNOSIS — I4892 Unspecified atrial flutter: Secondary | ICD-10-CM | POA: Diagnosis not present

## 2022-06-25 DIAGNOSIS — I440 Atrioventricular block, first degree: Secondary | ICD-10-CM | POA: Diagnosis not present

## 2022-06-25 DIAGNOSIS — I340 Nonrheumatic mitral (valve) insufficiency: Secondary | ICD-10-CM | POA: Diagnosis not present

## 2022-06-25 DIAGNOSIS — Z6832 Body mass index (BMI) 32.0-32.9, adult: Secondary | ICD-10-CM | POA: Diagnosis not present

## 2022-06-25 DIAGNOSIS — K5900 Constipation, unspecified: Secondary | ICD-10-CM | POA: Diagnosis not present

## 2022-06-25 DIAGNOSIS — I480 Paroxysmal atrial fibrillation: Secondary | ICD-10-CM | POA: Diagnosis not present

## 2022-06-25 DIAGNOSIS — E785 Hyperlipidemia, unspecified: Secondary | ICD-10-CM | POA: Diagnosis not present

## 2022-06-25 DIAGNOSIS — I10 Essential (primary) hypertension: Secondary | ICD-10-CM | POA: Diagnosis not present

## 2022-06-25 DIAGNOSIS — Z471 Aftercare following joint replacement surgery: Secondary | ICD-10-CM | POA: Diagnosis not present

## 2022-06-25 DIAGNOSIS — G4733 Obstructive sleep apnea (adult) (pediatric): Secondary | ICD-10-CM | POA: Diagnosis not present

## 2022-06-25 DIAGNOSIS — N400 Enlarged prostate without lower urinary tract symptoms: Secondary | ICD-10-CM | POA: Diagnosis not present

## 2022-06-25 DIAGNOSIS — I2510 Atherosclerotic heart disease of native coronary artery without angina pectoris: Secondary | ICD-10-CM | POA: Diagnosis not present

## 2022-06-25 DIAGNOSIS — E669 Obesity, unspecified: Secondary | ICD-10-CM | POA: Diagnosis not present

## 2022-06-25 NOTE — Discharge Summary (Signed)
Physician Discharge Summary      Patient ID: Harry Kline MRN: [id number] DOB/AGE: 01-26-1934 88 y.o.  Admit date: 06/23/2022 Discharge date: 06/24/2022 Admission Diagnoses:  Principal Problem:   Arthritis of knee Active Problems:   OA (osteoarthritis) of knee   Discharge Diagnoses:  Same  Surgeries: Procedure(s): RIGHT TOTAL KNEE ARTHROPLASTY on 06/23/2022   Consultants:   Discharged Condition: Stable  Hospital Course: Harry Kline is an 88 y.o. male who was admitted 06/23/2022 with a chief complaint of left knee pain, and found to have a diagnosis of Arthritis of knee.  They were brought to the operating room on 06/23/2022 and underwent the above named procedures.  Patient tolerated the procedure well.  Was ambulating on postop day #1 with pain controlled.  Restarted on Eliquis for DVT prophylaxis on postop day #1.  Discharged home in good condition with home health physical therapy as well as home CPM use.  He will follow-up with [place] in 14 days for repeat clinical evaluation.  Antibiotics given:  Anti-infectives (From admission, onward)    Start     Dose/Rate Route Frequency Ordered Stop   06/23/22 2000  ceFAZolin (ANCEF) IVPB 1 g/50 mL premix  Status:  Discontinued        1 g 100 mL/hr over 30 Minutes Intravenous Every 8 hours 06/23/22 1818 06/24/22 1921   06/23/22 1255  vancomycin (VANCOCIN) powder  Status:  Discontinued          As needed 06/23/22 1256 06/23/22 1514   06/23/22 0945  ceFAZolin (ANCEF) IVPB 2g/100 mL premix        2 g 200 mL/hr over 30 Minutes Intravenous On call to O.R. 06/23/22 0931 06/23/22 1230   06/23/22 0934  ceFAZolin (ANCEF) 2-4 GM/100ML-% IVPB       Note to Pharmacy: Brandt Loosen, GRETA: cabinet override      06/23/22 0934 06/23/22 1221     .  Recent vital signs:  Vitals:   06/24/22 0420 06/24/22 0803  BP: 115/62 (!) 114/54  Pulse: 60 (!) 54  Resp: 18   Temp: 98.1 F (36.7 C) 98.5 F (36.9 C)  SpO2: 95% 97%    Recent laboratory studies:   Results for orders placed or performed during the hospital encounter of 06/17/22  Surgical pcr screen   Specimen: Nasal Mucosa; Nasal Swab  Result Value Ref Range   MRSA, PCR NEGATIVE NEGATIVE   Staphylococcus aureus NEGATIVE NEGATIVE  Urine Culture   Specimen: Urine, Clean Catch  Result Value Ref Range   Specimen Description URINE, CLEAN CATCH    Special Requests NONE    Culture      NO GROWTH Performed at [facility], [place] [address] [place], [place] [address]    Report Status 06/18/2022 FINAL   CBC  Result Value Ref Range   WBC 6.1 4.0 - 10.5 K/uL   RBC 4.11 (L) 4.22 - 5.81 MIL/uL   Hemoglobin 14.5 13.0 - 17.0 g/dL   HCT 39.5 39.0 - 52.0 %   MCV 96.1 80.0 - 100.0 fL   MCH 35.3 (H) 26.0 - 34.0 pg   MCHC 36.7 (H) 30.0 - 36.0 g/dL   RDW 13.1 11.5 - 15.5 %   Platelets 157 150 - 400 K/uL   nRBC 0.0 0.0 - 0.2 %  Basic metabolic panel  Result Value Ref Range   Sodium 141 135 - 145 mmol/L   Potassium 3.4 (L) 3.5 - 5.1 mmol/L   Chloride 105 98 - 111 mmol/L  CO2 23 22 - 32 mmol/L   Glucose, Bld 104 (H) 70 - 99 mg/dL   BUN 19 8 - 23 mg/dL   Creatinine, Ser 1.19 0.61 - 1.24 mg/dL   Calcium 9.4 8.9 - 10.3 mg/dL   GFR, Estimated 60 (L) >60 mL/min   Anion gap 13 5 - 15  Urinalysis, Complete w Microscopic  Result Value Ref Range   Color, Urine YELLOW YELLOW   APPearance CLEAR CLEAR   Specific Gravity, Urine 1.025 1.005 - 1.030   pH 6.0 5.0 - 8.0   Glucose, UA NEGATIVE NEGATIVE mg/dL   Hgb urine dipstick NEGATIVE NEGATIVE   Bilirubin Urine NEGATIVE NEGATIVE   Ketones, ur NEGATIVE NEGATIVE mg/dL   Protein, ur NEGATIVE NEGATIVE mg/dL   Nitrite NEGATIVE NEGATIVE   Leukocytes,Ua NEGATIVE NEGATIVE   Squamous Epithelial / LPF 0-5 0 - 5   WBC, UA 0-5 0 - 5 WBC/hpf   RBC / HPF 0-5 0 - 5 RBC/hpf   Bacteria, UA RARE (A) NONE SEEN   Mucus PRESENT    Urine-Other MICROSCOPIC EXAM PERFORMED ON UNCONCENTRATED URINE     Discharge Medications:   Allergies as of 06/24/2022    No Known Allergies      Medication List     STOP taking these medications    diclofenac 25 MG EC tablet Commonly known as: VOLTAREN   diphenhydrAMINE 25 MG tablet Commonly known as: BENADRYL   naproxen sodium 220 MG tablet Commonly known as: ALEVE   Omega 3 1000 MG Caps       TAKE these medications    apixaban 5 MG Tabs tablet Commonly known as: ELIQUIS Take 1 tablet (5 mg total) by mouth 2 (two) times daily.   atorvastatin 20 MG tablet Commonly known as: LIPITOR Take 1 tablet (20 mg total) by mouth daily. What changed: when to take this   b complex vitamins capsule Take 1 capsule by mouth daily.   Co Q 10 100 MG Caps Take 100 mg by mouth daily.   docusate sodium 100 MG capsule Commonly known as: COLACE Take 100 mg by mouth 2 (two) times daily.   furosemide 40 MG tablet Commonly known as: LASIX Take 1 tablet (40 mg total) by mouth daily.   HYDROcodone-acetaminophen 5-325 MG tablet Commonly known as: NORCO/VICODIN Take 1-2 tablets by mouth every 4 (four) hours as needed for moderate pain (pain score 4-6). What changed:  how much to take reasons to take this   Melatonin 10 MG Caps Take 10 mg by mouth at bedtime.   methocarbamol 500 MG tablet Commonly known as: ROBAXIN Take 1 tablet (500 mg total) by mouth every 8 (eight) hours as needed for muscle spasms.   multivitamin tablet Take 1 tablet by mouth daily.   potassium chloride 10 MEQ tablet Commonly known as: KLOR-CON Take 1 tablet (10 mEq total) by mouth daily.   tamsulosin 0.4 MG Caps capsule Commonly known as: FLOMAX Take 1 capsule (0.4 mg total) by mouth daily. What changed: when to take this   vitamin C 1000 MG tablet Take 1,000 mg by mouth 4 (four) times a week.        Diagnostic Studies: CUP PACEART REMOTE DEVICE CHECK  Result Date: 06/10/2022 Scheduled remote reviewed. Normal device function.  Next remote 91 days- JJB   Disposition: Discharge disposition: 01-Home or Self  Care       Discharge Instructions     Call MD / Call 911   Complete by: As directed  If you experience chest pain or shortness of breath, CALL 911 and be transported to the hospital emergency room.  If you develope a fever above 101 F, pus (white drainage) or increased drainage or redness at the wound, or calf pain, call your surgeon's office.   Constipation Prevention   Complete by: As directed    Drink plenty of fluids.  Prune juice may be helpful.  You may use a stool softener, such as Colace (over the counter) 100 mg twice a day.  Use MiraLax (over the counter) for constipation as needed.   Diet - low sodium heart healthy   Complete by: As directed    Discharge instructions   Complete by: As directed    Weightbearing as tolerated on left knee CPM 1 hour 3 times a day Okay to shower dressing is waterproof   Increase activity slowly as tolerated   Complete by: As directed    Post-operative opioid taper instructions:   Complete by: As directed    POST-OPERATIVE OPIOID TAPER INSTRUCTIONS: It is important to wean off of your opioid medication as soon as possible. If you do not need pain medication after your surgery it is ok to stop day one. Opioids include: Codeine, Hydrocodone(Norco, Vicodin), Oxycodone(Percocet, oxycontin) and hydromorphone amongst others.  Long term and even short term use of opiods can cause: Increased pain response Dependence Constipation Depression Respiratory depression And more.  Withdrawal symptoms can include Flu like symptoms Nausea, vomiting And more Techniques to manage these symptoms Hydrate well Eat regular healthy meals Stay active Use relaxation techniques(deep breathing, meditating, yoga) Do Not substitute Alcohol to help with tapering If you have been on opioids for less than two weeks and do not have pain than it is ok to stop all together.  Plan to wean off of opioids This plan should start within one week post op of your joint  replacement. Maintain the same interval or time between taking each dose and first decrease the dose.  Cut the total daily intake of opioids by one tablet each day Next start to increase the time between doses. The last dose that should be eliminated is the evening dose.           Follow-up Information     Luetta Nutting, DO Follow up.   Specialty: Family Medicine Contact information: [address] [place] [address] [phone]         Constance Haw, MD .   Specialty: Cardiology Contact information: [place] [address] [phone]         Lelon Perla, MD .   Specialty: Cardiology Contact information: [address] [place] [address] [place] [address] [phone]         [facility] Follow up.   Specialty: Orthopedics Contact information: [address] [place] [address] [phone]                 Signed: Anderson Malta 06/25/2022, 11:08 AM

## 2022-06-25 NOTE — Telephone Encounter (Signed)
Scott from [facility] called for physical therapy orders:  2x 3w and start today. Also pt was in a lot of pain due to him doing so much with the nerve block yesterday.He advised pt to just not do the CPM today and he will check on pt tomorrow. He didn't want you to think he was trying to go against orders.   Cb [phone]

## 2022-06-28 ENCOUNTER — Encounter: Payer: Self-pay | Admitting: Family Medicine

## 2022-06-28 DIAGNOSIS — R1319 Other dysphagia: Secondary | ICD-10-CM

## 2022-06-28 NOTE — Telephone Encounter (Signed)
No problem.  Sounds like good judgment.

## 2022-06-30 ENCOUNTER — Encounter: Payer: Self-pay | Admitting: Orthopedic Surgery

## 2022-07-01 ENCOUNTER — Other Ambulatory Visit: Payer: Self-pay | Admitting: Surgical

## 2022-07-01 MED ORDER — HYDROCODONE-ACETAMINOPHEN 5-325 MG PO TABS: 1.0000 | ORAL_TABLET | Freq: Four times a day (QID) | ORAL | 0 refills | Status: DC | PRN

## 2022-07-02 NOTE — Progress Notes (Signed)
Remote pacemaker transmission.   

## 2022-07-08 ENCOUNTER — Ambulatory Visit (INDEPENDENT_AMBULATORY_CARE_PROVIDER_SITE_OTHER): Payer: Medicare Other | Admitting: Orthopedic Surgery

## 2022-07-08 ENCOUNTER — Ambulatory Visit (INDEPENDENT_AMBULATORY_CARE_PROVIDER_SITE_OTHER): Payer: Medicare Other

## 2022-07-08 DIAGNOSIS — Z96651 Presence of right artificial knee joint: Secondary | ICD-10-CM

## 2022-07-10 ENCOUNTER — Encounter: Payer: Self-pay | Admitting: Orthopedic Surgery

## 2022-07-10 NOTE — Progress Notes (Signed)
Post-Op Visit Note   Patient: Harry Kline           Date of Birth: June 17, 1934           MRN: [id number] Visit Date: 07/08/2022 PCP: Luetta Nutting, DO   Assessment & Plan:  Chief Complaint:  Chief Complaint  Patient presents with   Right Knee - Routine Post Op   Visit Diagnoses:  1. S/P total knee arthroplasty, right     Plan Harry Kline is an 88 year old patient is now 2 weeks out right total knee replacement.  He has been doing well.  Taking Tylenol for pain.  On examination the incision is intact.  Has range of motion of about 90 degrees cold today.  Extensor mechanism intact.  Plan at this time is to change over to outpatient physical therapy to really focus on full extension as well as continued flexion.  Come back in 4 weeks for clinical recheck.  Negative Homans no calf tenderness today.  Radiographs look good.  Follow-Up Instructions: Return in about 4 weeks (around 08/05/2022).   Orders:  Orders Placed This Encounter  Procedures   XR Knee 1-2 Views Right   No orders of the defined types were placed in this encounter.   Imaging: No results found.  PMFS History: Patient Active Problem List   Diagnosis Date Noted   Arthritis of knee 06/23/2022   OA (osteoarthritis) of knee 06/23/2022   History of squamous cell carcinoma 09/09/2020   Pacemaker 02/19/2020   History of lumbar fusion 02/19/2020   Hyperlipidemia 02/19/2020   BPH (benign prostatic hyperplasia) 02/19/2020   Hypertension 02/19/2020   Atrial flutter ([place]) 02/19/2020   Paroxysmal atrial fibrillation ([place]) 02/19/2020   Mitral valve regurgitation 02/19/2020   First degree AV block 02/19/2020   Anxiety 02/19/2020   Obesity (BMI 30.0-34.9) 02/19/2020   Insomnia 02/19/2020   Past Medical History:  Diagnosis Date   Arthritis 2010   Diagnosed but not enough to treat with medication.   Atrial fibrillation (HCC)    Atrial fibrillation (HCC)    CAD (coronary artery disease)    Cataract 2009   surgery both  eyes   GERD (gastroesophageal reflux disease) 2000   Hyperlipidemia    Hypertension    OSA (obstructive sleep apnea)    Pacemaker    Medtronic-   Skin cancer    Sleep apnea 1990   c-pap machine    Family History  Problem Relation Age of Onset   Breast cancer Mother    Cancer Mother    Heart failure Father    Heart disease Father    Healthy Sister    Diabetes Paternal Grandmother    Heart failure Paternal Grandfather    Heart disease Paternal Grandfather     Past Surgical History:  Procedure Laterality Date   CORONARY ARTERY BYPASS GRAFT     November 2003   [place]   RK surgery   SPINE SURGERY  2014   L4,L5 fusion   TONSILLECTOMY     TOTAL KNEE ARTHROPLASTY Right 06/23/2022   Procedure: RIGHT TOTAL KNEE ARTHROPLASTY;  Surgeon: Meredith Pel, MD;  Location: [facility];  Service: Orthopedics;  Laterality: Right;   Social History   Occupational History   Occupation: Retired.  Tobacco Use   Smoking status: Former    Packs/day: 1.00    Years: 20.00    Total pack years: 20.00    Types: Cigarettes    Quit date: 11/30/1973    Years since  quitting: 48.6   Smokeless tobacco: Never  Substance and Sexual Activity   Alcohol use: Yes    Alcohol/week: 2.0 standard drinks of alcohol    Types: 2 Cans of beer per week    Comment: Occasional wine or liquor   Drug use: Never   Sexual activity: Not Currently

## 2022-07-13 DIAGNOSIS — Z4789 Encounter for other orthopedic aftercare: Secondary | ICD-10-CM | POA: Diagnosis not present

## 2022-07-13 DIAGNOSIS — M25561 Pain in right knee: Secondary | ICD-10-CM | POA: Diagnosis not present

## 2022-07-13 DIAGNOSIS — M6281 Muscle weakness (generalized): Secondary | ICD-10-CM | POA: Diagnosis not present

## 2022-07-13 DIAGNOSIS — M25661 Stiffness of right knee, not elsewhere classified: Secondary | ICD-10-CM | POA: Diagnosis not present

## 2022-07-14 ENCOUNTER — Telehealth: Payer: Self-pay | Admitting: Gastroenterology

## 2022-07-14 ENCOUNTER — Encounter: Payer: Self-pay | Admitting: Gastroenterology

## 2022-07-14 NOTE — Telephone Encounter (Signed)
Hi Dr. Candis Schatz,  Supervising [facility] for 07/14/22, a.m.  This patient brought his records by for transfer of care.  His last EGD (throat stretching) was in [place] in 2016.  He now lives in [place] and is looking for continuation of care.  His records will be forwarded to you for your consideration.  Currently, he states he is having trouble swallowing and choking and wants to know if it may possibly be time to have his throat stretched again.  Please let me know how you would like to proceed.  Thank you.

## 2022-07-16 DIAGNOSIS — Z4789 Encounter for other orthopedic aftercare: Secondary | ICD-10-CM | POA: Diagnosis not present

## 2022-07-16 DIAGNOSIS — M6281 Muscle weakness (generalized): Secondary | ICD-10-CM | POA: Diagnosis not present

## 2022-07-16 DIAGNOSIS — M25561 Pain in right knee: Secondary | ICD-10-CM | POA: Diagnosis not present

## 2022-07-16 DIAGNOSIS — M25661 Stiffness of right knee, not elsewhere classified: Secondary | ICD-10-CM | POA: Diagnosis not present

## 2022-07-20 ENCOUNTER — Ambulatory Visit (INDEPENDENT_AMBULATORY_CARE_PROVIDER_SITE_OTHER): Payer: Medicare Other | Admitting: Family Medicine

## 2022-07-20 ENCOUNTER — Encounter: Payer: Self-pay | Admitting: Family Medicine

## 2022-07-20 DIAGNOSIS — N400 Enlarged prostate without lower urinary tract symptoms: Secondary | ICD-10-CM

## 2022-07-20 DIAGNOSIS — M1711 Unilateral primary osteoarthritis, right knee: Secondary | ICD-10-CM | POA: Diagnosis not present

## 2022-07-20 DIAGNOSIS — G4700 Insomnia, unspecified: Secondary | ICD-10-CM

## 2022-07-20 DIAGNOSIS — I10 Essential (primary) hypertension: Secondary | ICD-10-CM | POA: Diagnosis not present

## 2022-07-20 DIAGNOSIS — I480 Paroxysmal atrial fibrillation: Secondary | ICD-10-CM

## 2022-07-20 MED ORDER — TAMSULOSIN HCL 0.4 MG PO CAPS
0.4000 mg | ORAL_CAPSULE | Freq: Every day | ORAL | 3 refills | Status: DC
Start: 2022-07-20 — End: 2023-10-12

## 2022-07-20 MED ORDER — ATORVASTATIN CALCIUM 20 MG PO TABS
20.0000 mg | ORAL_TABLET | Freq: Every day | ORAL | 3 refills | Status: DC
Start: 2022-07-20 — End: 2023-10-12

## 2022-07-20 MED ORDER — TRAZODONE HCL 50 MG PO TABS: 50.0000 mg | ORAL_TABLET | Freq: Every evening | ORAL | 0 refills | Status: DC | PRN

## 2022-07-20 MED ORDER — FUROSEMIDE 40 MG PO TABS: 40.0000 mg | ORAL_TABLET | Freq: Every day | ORAL | 3 refills | Status: DC

## 2022-07-20 MED ORDER — POTASSIUM CHLORIDE ER 10 MEQ PO TBCR: 10.0000 meq | EXTENDED_RELEASE_TABLET | Freq: Every day | ORAL | 3 refills | Status: DC

## 2022-07-20 NOTE — Progress Notes (Signed)
Harry Kline - 88 y.o. male MRN [id number]  Date of birth: 1934-07-30  Subjective Chief Complaint  Patient presents with   Hypertension    HPI Harry Kline is a 88 y.o. male here today for follow up.   He had R knee replacement about 4 weeks ago.  Recovering well form this.  Using tylenol and topical voltaren for pain control.  He is having some difficulty sleeping.  Tried benadryl but still having difficulty.   BP is elevated today.  He has been checking regularly at home and had house call with nurse from his insurance company with BP of 130/82.  He denies chest pain, shortness of breath, palpitations, headache or vision changes.  Still followed by cardiology for PAF.  Remains on eliquis for anticoagulation.    ROS:  A comprehensive ROS was completed and negative except as noted per HPI   No Known Allergies  Past Medical History:  Diagnosis Date   Arthritis 2010   Diagnosed but not enough to treat with medication.   Atrial fibrillation ([place])    Atrial fibrillation ([place])    CAD (coronary artery disease)    Cataract 2009   surgery both eyes   GERD (gastroesophageal reflux disease) 2000   Hyperlipidemia    Hypertension    OSA (obstructive sleep apnea)    Pacemaker    Medtronic-   Skin cancer    Sleep apnea 1990   c-pap machine    Past Surgical History:  Procedure Laterality Date   CORONARY ARTERY BYPASS GRAFT     November 2003   [place]   RK surgery   SPINE SURGERY  2014   L4,L5 fusion   TONSILLECTOMY     TOTAL KNEE ARTHROPLASTY Right 06/23/2022   Procedure: RIGHT TOTAL KNEE ARTHROPLASTY;  Surgeon: Meredith Pel, MD;  Location: [facility];  Service: Orthopedics;  Laterality: Right;    Social History   Socioeconomic History   Marital status: Widowed    Spouse name: Not on file   Number of children: 3   Years of education: 16   Highest education level: Bachelor's degree (e.g., BA, AB, BS)  Occupational History   Occupation: Retired.  Tobacco Use    Smoking status: Former    Packs/day: 1.00    Years: 20.00    Total pack years: 20.00    Types: Cigarettes    Quit date: 11/30/1973    Years since quitting: 48.6   Smokeless tobacco: Never  Substance and Sexual Activity   Alcohol use: Yes    Alcohol/week: 2.0 standard drinks of alcohol    Types: 2 Cans of beer per week    Comment: Occasional wine or liquor   Drug use: Never   Sexual activity: Not Currently  Other Topics Concern   Not on file  Social History Narrative   Lives alone. He has three children. He enjoys traveling.   Social Determinants of Health   Financial Resource Strain: Low Risk  (01/14/2022)   Overall Financial Resource Strain (CARDIA)    Difficulty of Paying Living Expenses: Not hard at all  Food Insecurity: No Food Insecurity (01/14/2022)   Hunger Vital Sign    Worried About Running Out of Food in the Last Year: Never true    Ran Out of Food in the Last Year: Never true  Transportation Needs: No Transportation Needs (01/14/2022)   PRAPARE - Hydrologist (Medical): No    Lack of Transportation (Non-Medical): No  Physical Activity: Sufficiently Active (01/14/2022)   Exercise Vital Sign    Days of Exercise per Week: 5 days    Minutes of Exercise per Session: 90 min  Stress: No Stress Concern Present (01/14/2022)   [place]    Feeling of Stress : Not at all  Social Connections: Moderately Integrated (01/14/2022)   Social Connection and Isolation Panel [NHANES]    Frequency of Communication with Friends and Family: More than three times a week    Frequency of Social Gatherings with Friends and Family: More than three times a week    Attends Religious Services: More than 4 times per year    Active Member of [employer] or Organizations: Yes    Attends Archivist Meetings: More than 4 times per year    Marital Status: Widowed    Family History  Problem Relation  Age of Onset   Breast cancer Mother    Cancer Mother    Heart failure Father    Heart disease Father    Healthy Sister    Diabetes Paternal Grandmother    Heart failure Paternal Grandfather    Heart disease Paternal Grandfather     Health Maintenance  Topic Date Due   Zoster Vaccines- Shingrix (1 of 2) Never done   INFLUENZA VACCINE  06/30/2022   COVID-19 Vaccine (5 - Moderna series) 08/05/2022 (Originally 02/09/2022)   TETANUS/TDAP  02/06/2030   Pneumonia Vaccine 14+ Years old  Completed   HPV VACCINES  Aged Out     ----------------------------------------------------------------------------------------------------------------------------------------------------------------------------------------------------------------- Physical Exam BP (!) 152/73 (BP Location: Left Arm, Patient Position: Sitting, Cuff Size: Large)   Pulse (!) 53   Temp (!) 97.5 F (36.4 C) (Oral)   Ht '5\' 10"'$  (1.778 m)   Wt 235 lb 1.9 oz (106.6 kg)   SpO2 99%   BMI 33.74 kg/m   Physical Exam Constitutional:      Appearance: Normal appearance.  Eyes:     General: No scleral icterus. Cardiovascular:     Rate and Rhythm: Normal rate and regular rhythm.  Pulmonary:     Effort: Pulmonary effort is normal.     Breath sounds: Normal breath sounds.  Musculoskeletal:     Cervical back: Neck supple.  Neurological:     Mental Status: He is alert.  Psychiatric:        Mood and Affect: Mood normal.        Behavior: Behavior normal.     ------------------------------------------------------------------------------------------------------------------------------------------------------------------------------------------------------------------- Assessment and Plan  Paroxysmal atrial fibrillation (HCC) Remains stable.  He will continue to see cardiology.  Remains on eliquis for anticoagulation.    Hypertension BP elevated in clinic however readings at home have been well controlled.  Continue home  monitoring.    BPH (benign prostatic hyperplasia) Stable symptoms with flomax.  Continue at current strength.    OA (osteoarthritis) of knee S/p TKA of the R knee.  Doing well with this.   Insomnia Adding trazodone 50-'100mg'$  qhs prn.    Meds ordered this encounter  Medications   atorvastatin (LIPITOR) 20 MG tablet    Sig: Take 1 tablet (20 mg total) by mouth daily.    Dispense:  90 tablet    Refill:  3   furosemide (LASIX) 40 MG tablet    Sig: Take 1 tablet (40 mg total) by mouth daily.    Dispense:  90 tablet    Refill:  3   potassium chloride (KLOR-CON) 10 MEQ tablet  Sig: Take 1 tablet (10 mEq total) by mouth daily.    Dispense:  90 tablet    Refill:  3   tamsulosin (FLOMAX) 0.4 MG CAPS capsule    Sig: Take 1 capsule (0.4 mg total) by mouth daily.    Dispense:  90 capsule    Refill:  3   traZODone (DESYREL) 50 MG tablet    Sig: Take 1-2 tablets (50-100 mg total) by mouth at bedtime as needed for sleep.    Dispense:  90 tablet    Refill:  0    Return in about 6 months (around 01/20/2023) for Annual exam/fasting labs. .    This visit occurred during the SARS-CoV-2 public health emergency.  Safety protocols were in place, including screening questions prior to the visit, additional usage of staff PPE, and extensive cleaning of exam room while observing appropriate contact time as indicated for disinfecting solutions.

## 2022-07-20 NOTE — Assessment & Plan Note (Signed)
S/p TKA of the R knee.  Doing well with this.

## 2022-07-20 NOTE — Assessment & Plan Note (Signed)
BP elevated in clinic however readings at home have been well controlled.  Continue home monitoring.

## 2022-07-20 NOTE — Assessment & Plan Note (Signed)
Adding trazodone 50-100mg qhs prn.  

## 2022-07-20 NOTE — Patient Instructions (Signed)
Great to see you today! Try trazodone 50-'100mg'$  for sleep.  See me again in 6 months or sooner if needed.  We'll check blood work at this visit.

## 2022-07-20 NOTE — Assessment & Plan Note (Signed)
Remains stable.  He will continue to see cardiology.  Remains on eliquis for anticoagulation.

## 2022-07-20 NOTE — Assessment & Plan Note (Signed)
Stable symptoms with flomax.  Continue at current strength.

## 2022-07-21 DIAGNOSIS — M6281 Muscle weakness (generalized): Secondary | ICD-10-CM | POA: Diagnosis not present

## 2022-07-21 DIAGNOSIS — Z4789 Encounter for other orthopedic aftercare: Secondary | ICD-10-CM | POA: Diagnosis not present

## 2022-07-21 DIAGNOSIS — M25661 Stiffness of right knee, not elsewhere classified: Secondary | ICD-10-CM | POA: Diagnosis not present

## 2022-07-21 DIAGNOSIS — M25561 Pain in right knee: Secondary | ICD-10-CM | POA: Diagnosis not present

## 2022-07-23 DIAGNOSIS — M6281 Muscle weakness (generalized): Secondary | ICD-10-CM | POA: Diagnosis not present

## 2022-07-23 DIAGNOSIS — M25661 Stiffness of right knee, not elsewhere classified: Secondary | ICD-10-CM | POA: Diagnosis not present

## 2022-07-23 DIAGNOSIS — M25561 Pain in right knee: Secondary | ICD-10-CM | POA: Diagnosis not present

## 2022-07-23 DIAGNOSIS — Z4789 Encounter for other orthopedic aftercare: Secondary | ICD-10-CM | POA: Diagnosis not present

## 2022-07-28 DIAGNOSIS — M25661 Stiffness of right knee, not elsewhere classified: Secondary | ICD-10-CM | POA: Diagnosis not present

## 2022-07-28 DIAGNOSIS — M6281 Muscle weakness (generalized): Secondary | ICD-10-CM | POA: Diagnosis not present

## 2022-07-28 DIAGNOSIS — M25561 Pain in right knee: Secondary | ICD-10-CM | POA: Diagnosis not present

## 2022-07-28 DIAGNOSIS — Z4789 Encounter for other orthopedic aftercare: Secondary | ICD-10-CM | POA: Diagnosis not present

## 2022-07-30 DIAGNOSIS — M25661 Stiffness of right knee, not elsewhere classified: Secondary | ICD-10-CM | POA: Diagnosis not present

## 2022-07-30 DIAGNOSIS — M6281 Muscle weakness (generalized): Secondary | ICD-10-CM | POA: Diagnosis not present

## 2022-07-30 DIAGNOSIS — M25561 Pain in right knee: Secondary | ICD-10-CM | POA: Diagnosis not present

## 2022-07-30 DIAGNOSIS — Z4789 Encounter for other orthopedic aftercare: Secondary | ICD-10-CM | POA: Diagnosis not present

## 2022-07-31 DIAGNOSIS — H01021 Squamous blepharitis right upper eyelid: Secondary | ICD-10-CM | POA: Diagnosis not present

## 2022-07-31 DIAGNOSIS — H35372 Puckering of macula, left eye: Secondary | ICD-10-CM | POA: Diagnosis not present

## 2022-07-31 DIAGNOSIS — H35033 Hypertensive retinopathy, bilateral: Secondary | ICD-10-CM | POA: Diagnosis not present

## 2022-07-31 DIAGNOSIS — H01024 Squamous blepharitis left upper eyelid: Secondary | ICD-10-CM | POA: Diagnosis not present

## 2022-08-01 IMAGING — CR DG KNEE COMPLETE 4+V*R*
4 series · 4 of 4 positions shown · non-contrast
Comparison: None Available.

CLINICAL DATA: Acute onset right knee pain and swelling while
bending down today.

EXAM:
RIGHT KNEE - COMPLETE 4+ VIEW

[t knee ap right]
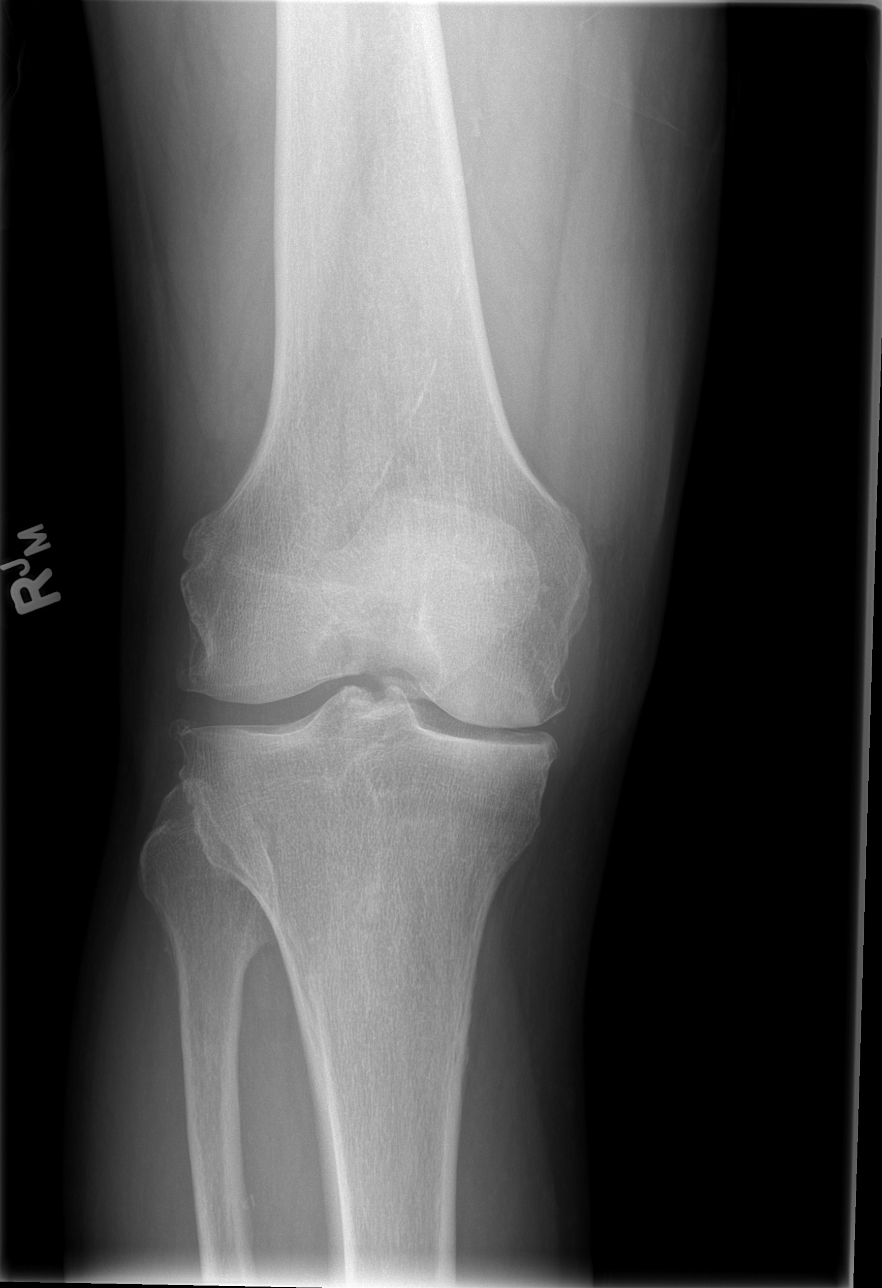

[t knee oblique right (1 of 2)]
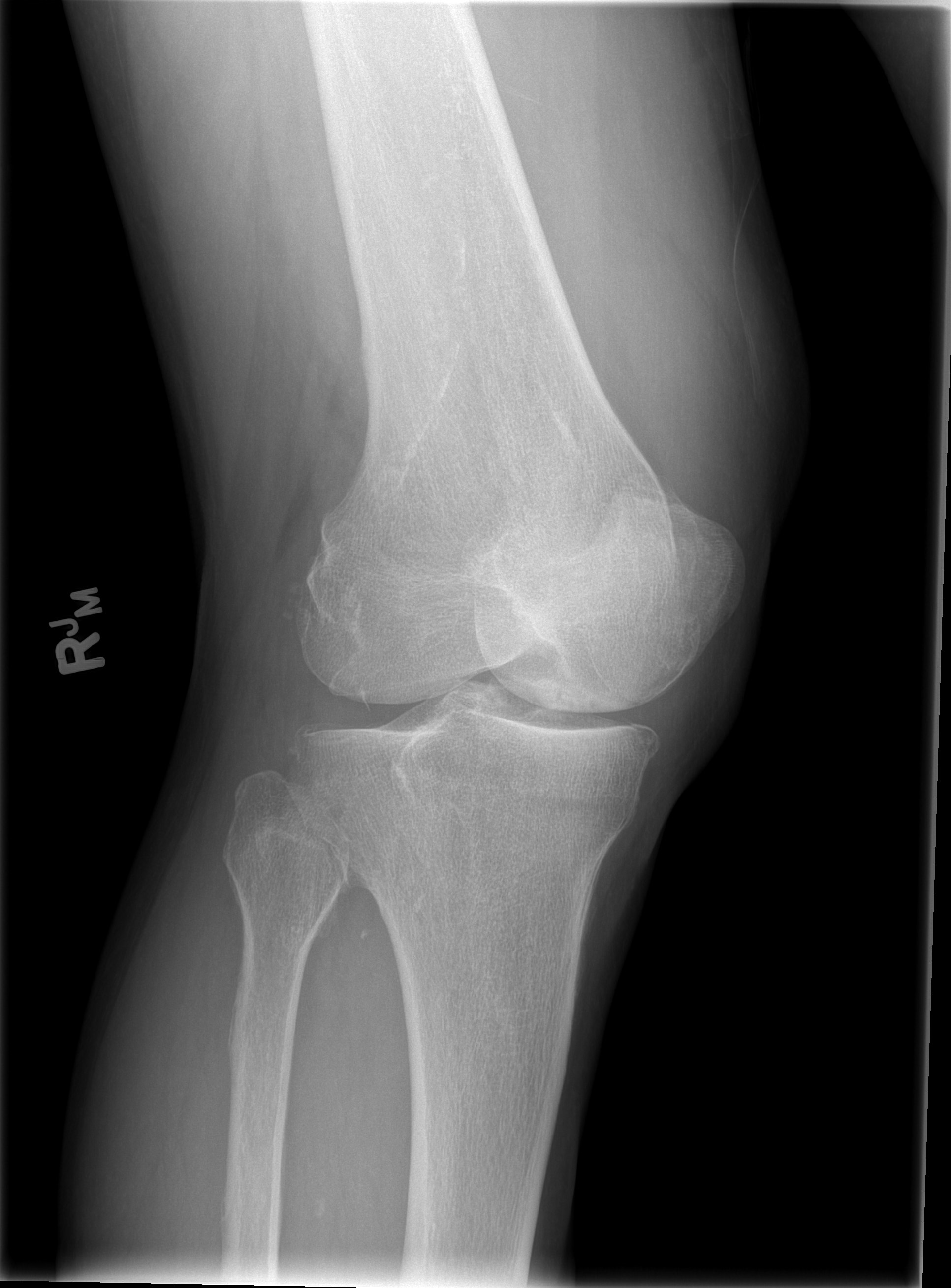

[t knee oblique right (2 of 2)]
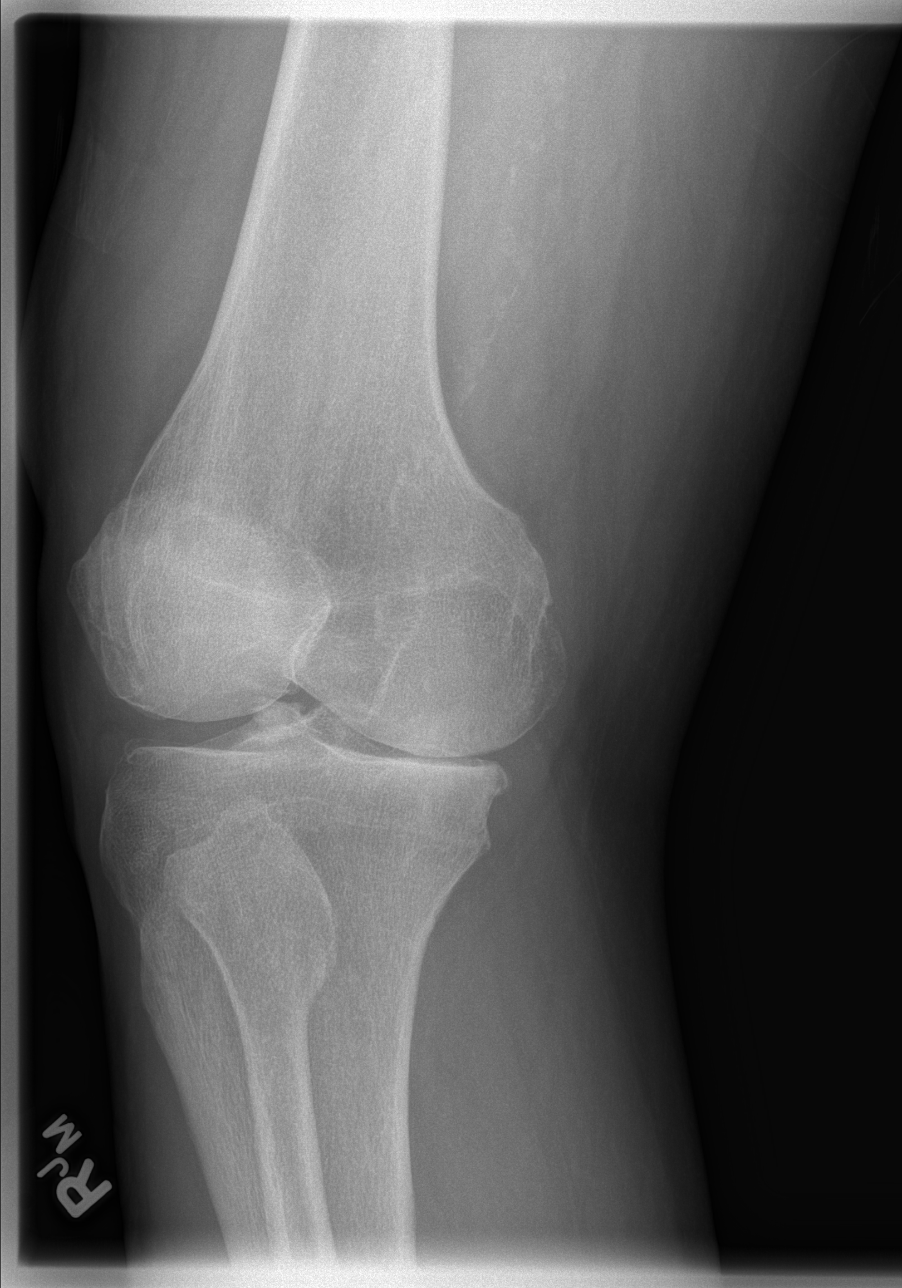

[t knee lat right]
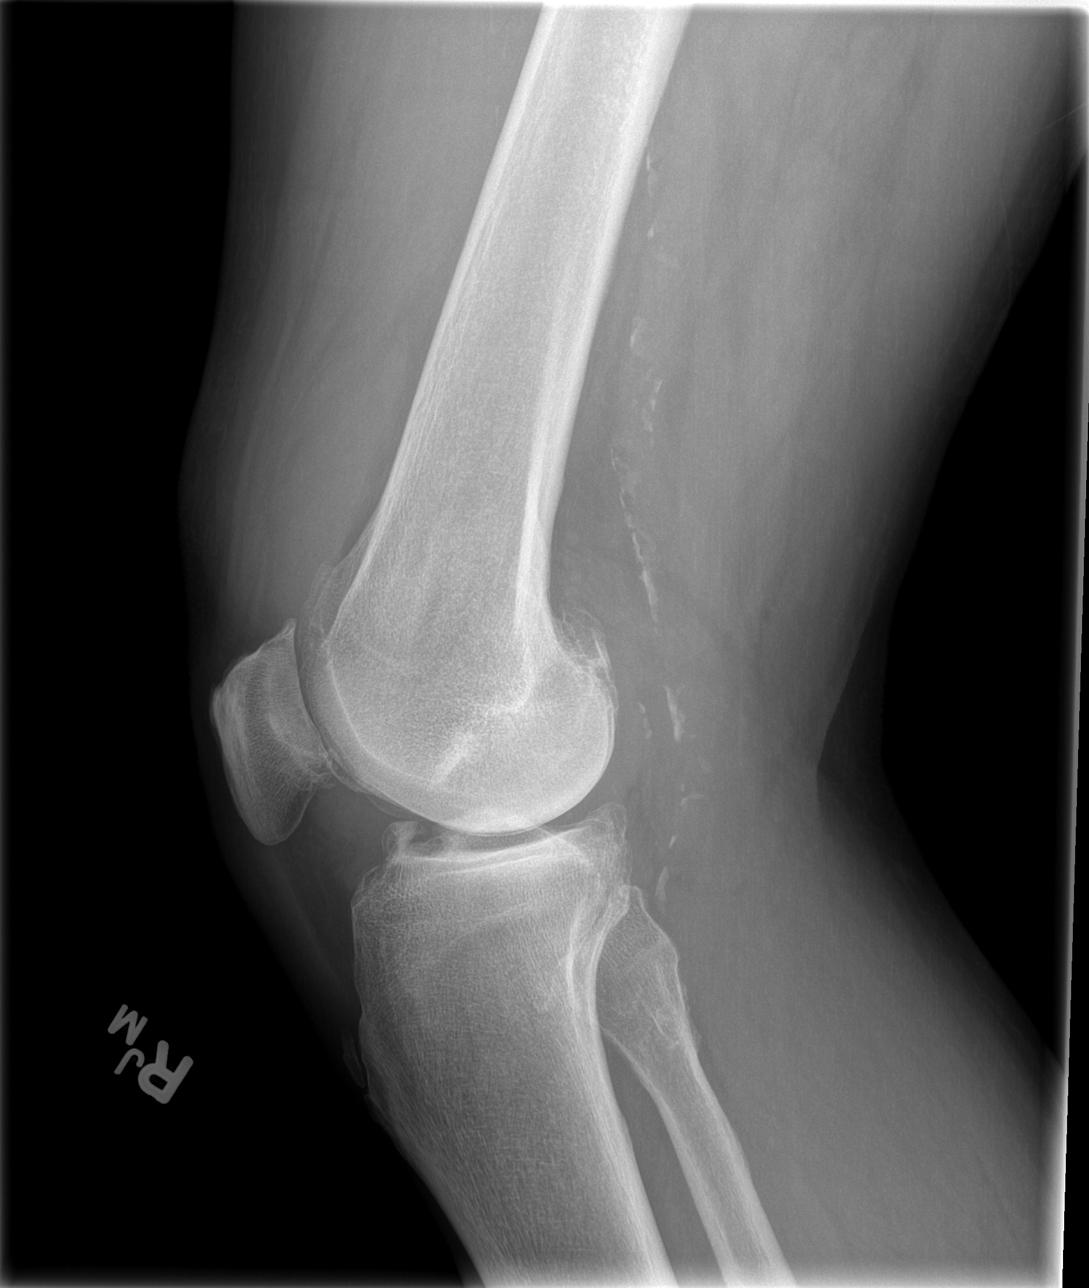

[4 of 4 positions shown; findings below may reference images not displayed]

FINDINGS: No evidence of acute fracture or dislocation. A moderate to large
knee joint effusion is seen. Moderate osteoarthritis is seen
involving the medial and patellofemoral compartments. Peripheral
vascular calcification is noted.
IMPRESSION: Moderate to large knee joint effusion.

Moderate osteoarthritis involving medial and patellofemoral
compartments.

## 2022-08-05 ENCOUNTER — Ambulatory Visit (INDEPENDENT_AMBULATORY_CARE_PROVIDER_SITE_OTHER): Payer: Medicare Other | Admitting: Orthopedic Surgery

## 2022-08-05 ENCOUNTER — Encounter: Payer: Self-pay | Admitting: Orthopedic Surgery

## 2022-08-05 DIAGNOSIS — M25561 Pain in right knee: Secondary | ICD-10-CM | POA: Diagnosis not present

## 2022-08-05 DIAGNOSIS — M6281 Muscle weakness (generalized): Secondary | ICD-10-CM | POA: Diagnosis not present

## 2022-08-05 DIAGNOSIS — M25661 Stiffness of right knee, not elsewhere classified: Secondary | ICD-10-CM | POA: Diagnosis not present

## 2022-08-05 DIAGNOSIS — Z96651 Presence of right artificial knee joint: Secondary | ICD-10-CM

## 2022-08-05 DIAGNOSIS — Z4789 Encounter for other orthopedic aftercare: Secondary | ICD-10-CM | POA: Diagnosis not present

## 2022-08-05 MED ORDER — AMOXICILLIN 500 MG PO TABS: ORAL_TABLET | ORAL | 0 refills | Status: DC

## 2022-08-05 NOTE — Progress Notes (Signed)
Post-Op Visit Note   Patient: Harry Kline           Date of Birth: [birth year]           MRN: [id number] Visit Date: 08/05/2022 PCP: Luetta Nutting, DO   Assessment & Plan:  Chief Complaint:  Chief Complaint  Patient presents with   Right Knee - Routine Post Op     06/23/22 (6w 1d) Right Total Knee Arthroplasty - Right     Visit Diagnoses:  1. S/P total knee arthroplasty, right     Plan: Patient is an 88 year old male who presents s/p right total knee arthroplasty on 06/23/2022.  Doing well overall.  Going to physical therapy.  He has reached about 120 degrees in physical therapy according to therapy notes that is accompanying him today.  He denies any fevers or chills.  He is ambulating without any cane or walker.  Does well with stairs taking them sequentially and has no difficulty getting off from a low toilet.  Pain is controlled and most days he does not have to take any sort of pain medication.  On exam, patient has 0 degrees extension and 115 degrees of knee flexion.  No effusion noted.  Incision is well-healed.  Able to perform straight leg raise without extensor lag.  Excellent quad strength rated 5/5.  No calf tenderness.  Negative Homans' sign.  Stable to varus and valgus stress.  Ambulates without antalgia.  Plan is continue with physical therapy.  Okay to transition to home exercise program at patient's discretion whenever he feels his appropriate.  Counseled him on dental antibiotic prophylaxis today.  He will follow-up with Dr. Marlou Sa for final check in 6 weeks.  Follow-Up Instructions: No follow-ups on file.   Orders:  No orders of the defined types were placed in this encounter.  No orders of the defined types were placed in this encounter.   Imaging: No results found.  PMFS History: Patient Active Problem List   Diagnosis Date Noted   Arthritis of knee 06/23/2022   OA (osteoarthritis) of knee 06/23/2022   History of squamous cell carcinoma 09/09/2020    Pacemaker 02/19/2020   History of lumbar fusion 02/19/2020   Hyperlipidemia 02/19/2020   BPH (benign prostatic hyperplasia) 02/19/2020   Hypertension 02/19/2020   Atrial flutter ([place]) 02/19/2020   Paroxysmal atrial fibrillation ([place]) 02/19/2020   Mitral valve regurgitation 02/19/2020   First degree AV block 02/19/2020   Anxiety 02/19/2020   Obesity (BMI 30.0-34.9) 02/19/2020   Insomnia 02/19/2020   Past Medical History:  Diagnosis Date   Arthritis 2010   Diagnosed but not enough to treat with medication.   Atrial fibrillation ([place])    Atrial fibrillation ([place])    CAD (coronary artery disease)    Cataract 2009   surgery both eyes   GERD (gastroesophageal reflux disease) 2000   Hyperlipidemia    Hypertension    OSA (obstructive sleep apnea)    Pacemaker    Medtronic-   Skin cancer    Sleep apnea 1990   c-pap machine    Family History  Problem Relation Age of Onset   Breast cancer Mother    Cancer Mother    Heart failure Father    Heart disease Father    Healthy Sister    Diabetes Paternal Grandmother    Heart failure Paternal Grandfather    Heart disease Paternal Grandfather     Past Surgical History:  Procedure Laterality Date   CORONARY ARTERY BYPASS GRAFT  November 2003   [place]   RK surgery   SPINE SURGERY  2014   L4,L5 fusion   TONSILLECTOMY     TOTAL KNEE ARTHROPLASTY Right 06/23/2022   Procedure: RIGHT TOTAL KNEE ARTHROPLASTY;  Surgeon: Meredith Pel, MD;  Location: [place];  Service: Orthopedics;  Laterality: Right;   Social History   Occupational History   Occupation: Retired.  Tobacco Use   Smoking status: Former    Packs/day: 1.00    Years: 20.00    Total pack years: 20.00    Types: Cigarettes    Quit date: 11/30/1973    Years since quitting: 48.7   Smokeless tobacco: Never  Substance and Sexual Activity   Alcohol use: Yes    Alcohol/week: 2.0 standard drinks of alcohol    Types: 2 Cans of beer per week    Comment:  Occasional wine or liquor   Drug use: Never   Sexual activity: Not Currently

## 2022-08-07 DIAGNOSIS — Z4789 Encounter for other orthopedic aftercare: Secondary | ICD-10-CM | POA: Diagnosis not present

## 2022-08-07 DIAGNOSIS — M25661 Stiffness of right knee, not elsewhere classified: Secondary | ICD-10-CM | POA: Diagnosis not present

## 2022-08-07 DIAGNOSIS — M6281 Muscle weakness (generalized): Secondary | ICD-10-CM | POA: Diagnosis not present

## 2022-08-07 DIAGNOSIS — M25561 Pain in right knee: Secondary | ICD-10-CM | POA: Diagnosis not present

## 2022-08-12 DIAGNOSIS — Z4789 Encounter for other orthopedic aftercare: Secondary | ICD-10-CM | POA: Diagnosis not present

## 2022-08-12 DIAGNOSIS — M25661 Stiffness of right knee, not elsewhere classified: Secondary | ICD-10-CM | POA: Diagnosis not present

## 2022-08-12 DIAGNOSIS — M6281 Muscle weakness (generalized): Secondary | ICD-10-CM | POA: Diagnosis not present

## 2022-08-12 DIAGNOSIS — M25561 Pain in right knee: Secondary | ICD-10-CM | POA: Diagnosis not present

## 2022-08-14 DIAGNOSIS — M25661 Stiffness of right knee, not elsewhere classified: Secondary | ICD-10-CM | POA: Diagnosis not present

## 2022-08-14 DIAGNOSIS — M25561 Pain in right knee: Secondary | ICD-10-CM | POA: Diagnosis not present

## 2022-08-14 DIAGNOSIS — M6281 Muscle weakness (generalized): Secondary | ICD-10-CM | POA: Diagnosis not present

## 2022-08-14 DIAGNOSIS — Z4789 Encounter for other orthopedic aftercare: Secondary | ICD-10-CM | POA: Diagnosis not present

## 2022-08-18 ENCOUNTER — Encounter: Payer: Self-pay | Admitting: Gastroenterology

## 2022-08-18 ENCOUNTER — Ambulatory Visit: Payer: Medicare Other | Admitting: Gastroenterology

## 2022-08-18 VITALS — BP 140/72 | HR 50 | Ht 70.0 in | Wt 231.0 lb

## 2022-08-18 DIAGNOSIS — I480 Paroxysmal atrial fibrillation: Secondary | ICD-10-CM | POA: Diagnosis not present

## 2022-08-18 DIAGNOSIS — R1319 Other dysphagia: Secondary | ICD-10-CM

## 2022-08-18 DIAGNOSIS — K5900 Constipation, unspecified: Secondary | ICD-10-CM

## 2022-08-18 NOTE — Patient Instructions (Signed)
_______________________________________________________  If you are age 86 or older, your body mass index should be between 23-30. Your Body mass index is 33.15 kg/m. If this is out of the aforementioned range listed, please consider follow up with your Primary Care Provider.  If you are age 39 or younger, your body mass index should be between 19-25. Your Body mass index is 33.15 kg/m. If this is out of the aformentioned range listed, please consider follow up with your Primary Care Provider.   You have been scheduled for an endoscopy. Please follow written instructions given to you at your visit today. If you use inhalers (even only as needed), please bring them with you on the day of your procedure.   The  GI providers would like to encourage you to use [facility] to communicate with providers for non-urgent requests or questions.  Due to long hold times on the telephone, sending your provider a message by [facility] may be a faster and more efficient way to get a response.  Please allow 48 business hours for a response.  Please remember that this is for non-urgent requests.   It was a pleasure to see you today!  Thank you for trusting me with your gastrointestinal care!    Scott E.Candis Schatz

## 2022-08-18 NOTE — Progress Notes (Signed)
HPI : Harry Kline is a very pleasant 88 year old male with a history of atrial fibrillation, coronary artery disease status post CABG, osteoarthritis status post recent total knee arthroplasty who is referred to [place] by Dr. Luetta Nutting for further evaluation and treatment of dysphagia.  The patient has a history of dysphagia attributable to a Schatzki ring.  He last underwent an upper endoscopy in November 2016 in [place], [place] which was notable for a Schatzki ring, mild reflux esophagitis, patchy gastric erythema/polyps.  The Schatzki ring was dilated with a 54 French Savary dilator.  He reports having maybe 2 prior dilations before that one, each about 5 years apart. He states that he did not have any dysphagia for several years following the dilation until this past year.  Several times a week, he will feel that food is getting stuck in his chest.  Usually it eventually goes down, but sometimes he has to cough it back up. He denies any regular GERD symptoms such as heartburn or acid regurgitation.  He does not think this was a major problem for him in the past either.  He knows he was taking Prilosec for many years, but stopped it a long time ago. No problems with nausea or vomiting.  No significant abdominal pain.  His weight is stable. He denies any chronic lower GI symptoms other than occasional hard stools. He has remote history of coronary artery disease, and was diagnosed with atrial fibrillation a few years ago.  He denies any cardiopulmonary symptoms such as chest pain/pressure, shortness of breath, lower extremity edema, lightheadedness/dizziness, palpitations.  He had an internal pacemaker placed due to low heart rate around the time he was diagnosed with atrial fibrillation.  He had a right total knee arthroplasty 2 months ago.  He was cleared by cardiology for this surgery without any further additional testing.  He was recommended to hold his Eliquis for 3 days prior to his surgery. He  has recovered well from his surgery, but does complain of persistent discomfort in his knee which is continued to limit his physical activity.    Past Medical History:  Diagnosis Date   Arthritis 2010   Diagnosed but not enough to treat with medication.   Atrial fibrillation (Harry Kline)    Atrial fibrillation (Harry Kline)    CAD (coronary artery disease)    Cataract 2009   surgery both eyes   GERD (gastroesophageal reflux disease) 2000   Hyperlipidemia    Hypertension    OSA (obstructive sleep apnea)    Pacemaker    Medtronic-   Skin cancer    Sleep apnea 1990   c-pap machine     Past Surgical History:  Procedure Laterality Date   CORONARY ARTERY BYPASS GRAFT     November 2003   [place]   RK surgery   SPINE SURGERY  2014   L4,L5 fusion   TONSILLECTOMY     TOTAL KNEE ARTHROPLASTY Right 06/23/2022   Procedure: RIGHT TOTAL KNEE ARTHROPLASTY;  Surgeon: Harry Pel, MD;  Location: [place];  Service: Orthopedics;  Laterality: Right;   Family History  Problem Relation Age of Onset   Breast cancer Mother    Cancer Mother    Heart failure Father    Heart disease Father    Healthy Sister    Diabetes Paternal Grandmother    Heart failure Paternal Grandfather    Heart disease Paternal Grandfather    Social History   Tobacco Use   Smoking status:  Former    Packs/day: 1.00    Years: 20.00    Total pack years: 20.00    Types: Cigarettes    Quit date: 11/30/1973    Years since quitting: 48.7   Smokeless tobacco: Never  Substance Use Topics   Alcohol use: Yes    Alcohol/week: 2.0 standard drinks of alcohol    Types: 2 Cans of beer per week    Comment: Occasional wine or liquor   Drug use: Never   Current Outpatient Medications  Medication Sig Dispense Refill   amoxicillin (AMOXIL) 500 MG tablet Take 2g 1 hour prior to dental procedure. 10 tablet 0   apixaban (ELIQUIS) 5 MG TABS tablet Take 1 tablet (5 mg total) by mouth 2 (two) times daily. 180 tablet 1   Ascorbic  Acid (VITAMIN C) 1000 MG tablet Take 1,000 mg by mouth 4 (four) times a week.     atorvastatin (LIPITOR) 20 MG tablet Take 1 tablet (20 mg total) by mouth daily. 90 tablet 3   b complex vitamins capsule Take 1 capsule by mouth daily.     Coenzyme Q10 (CO Q 10) 100 MG CAPS Take 100 mg by mouth daily.     docusate sodium (COLACE) 100 MG capsule Take 100 mg by mouth 2 (two) times daily.     furosemide (LASIX) 40 MG tablet Take 1 tablet (40 mg total) by mouth daily. 90 tablet 3   Melatonin 10 MG CAPS Take 10 mg by mouth at bedtime.     Multiple Vitamin (MULTIVITAMIN) tablet Take 1 tablet by mouth daily.     potassium chloride (KLOR-CON) 10 MEQ tablet Take 1 tablet (10 mEq total) by mouth daily. 90 tablet 3   tamsulosin (FLOMAX) 0.4 MG CAPS capsule Take 1 capsule (0.4 mg total) by mouth daily. 90 capsule 3   traZODone (DESYREL) 50 MG tablet Take 1-2 tablets (50-100 mg total) by mouth at bedtime as needed for sleep. 90 tablet 0   No current facility-administered medications for this visit.   No Known Allergies   Review of Systems: All systems reviewed and negative except where noted in HPI.    No results found.  Physical Exam: BP (!) 140/72   Pulse (!) 50   Ht '5\' 10"'$  (1.778 m)   Wt 231 lb (104.8 kg)   BMI 33.15 kg/m  Constitutional: Pleasant,well-developed, Caucasian male in no acute distress.  Appears younger than stated age 49: Normocephalic and atraumatic. Conjunctivae are normal. No scleral icterus. Neck supple.  Cardiovascular: Normal rate, regular rhythm.  Pulmonary/chest: Effort normal and breath sounds normal. No wheezing, rales or rhonchi. Abdominal: Soft, nondistended, nontender. Bowel sounds active throughout. There are no masses palpable. No hepatomegaly. Extremities: no edema Neurological: Alert and oriented to person place and time. Skin: Skin is warm and dry. No rashes noted.  Isolated bruise on right upper extremity Psychiatric: Normal mood and affect. Behavior is  normal.  CBC    Component Value Date/Time   WBC 6.1 06/17/2022 0900   RBC 4.11 (L) 06/17/2022 0900   HGB 14.5 06/17/2022 0900   HCT 39.5 06/17/2022 0900   PLT 157 06/17/2022 0900   MCV 96.1 06/17/2022 0900   MCH 35.3 (H) 06/17/2022 0900   MCHC 36.7 (H) 06/17/2022 0900   RDW 13.1 06/17/2022 0900   LYMPHSABS 1,593 01/20/2022 0000   EOSABS 189 01/20/2022 0000   BASOSABS 38 01/20/2022 0000    CMP     Component Value Date/Time   NA 141 06/17/2022 0900  K 3.4 (L) 06/17/2022 0900   CL 105 06/17/2022 0900   CO2 23 06/17/2022 0900   GLUCOSE 104 (H) 06/17/2022 0900   BUN 19 06/17/2022 0900   CREATININE 1.19 06/17/2022 0900   CREATININE 1.17 01/20/2022 0000   CALCIUM 9.4 06/17/2022 0900   PROT 6.7 01/20/2022 0000   AST 23 01/20/2022 0000   ALT 21 01/20/2022 0000   BILITOT 1.3 (H) 01/20/2022 0000   GFRNONAA 60 (L) 06/17/2022 0900     ASSESSMENT AND PLAN: 88 year old male with history of dysphagia attributed to Schatzki ring, previously dilated 2 or 3 times, with recurrence of dysphagia in the past year.  His dysphagia is almost certainly due to recurrence of a Schatzki ring.  This should be easily treatable with an EGD with dilation.  Although the patient is 88 years old, he has excellent functional status and is very active.  He recently tolerated total knee arthroplasty with no issues.  He was cleared by cardiology to undergo the surgery.  I think he is a very reasonable candidate for an elective EGD with dilation in the [place].  Although he was approved to hold his Eliquis for 3 days prior to his knee surgery, I think holding it 1 day prior for an EGD with dilation is very reasonable. We discussed his constipation, and he admits that he is not drinking enough water.  I recommended he drink at least 64 ounces of water daily and add Metamucil on a daily basis to improve his stool bulk and consistency.  Dysphagia, secondary to Schatzki ring - EGD with dilation - Hold Eliquis x 1 day -  No need for preop (just done in July prior to knee surgery)  Constipation - Increase water consumption - Add Metamucil daily  Atrial fibrillation - Hold Eliquis as above  The details, risks (including bleeding, perforation, infection, missed lesions, medication reactions and possible hospitalization or surgery if complications occur), benefits, and alternatives to EGD with possible biopsy and possible dilation were discussed with the patient and he consents to proceed.   Azrielle Springsteen E. Candis Schatz, MD [facility]  CC:  Luetta Nutting, DO

## 2022-08-19 ENCOUNTER — Encounter: Payer: Self-pay | Admitting: Gastroenterology

## 2022-08-19 DIAGNOSIS — M25661 Stiffness of right knee, not elsewhere classified: Secondary | ICD-10-CM | POA: Diagnosis not present

## 2022-08-19 DIAGNOSIS — M25561 Pain in right knee: Secondary | ICD-10-CM | POA: Diagnosis not present

## 2022-08-19 DIAGNOSIS — Z4789 Encounter for other orthopedic aftercare: Secondary | ICD-10-CM | POA: Diagnosis not present

## 2022-08-19 DIAGNOSIS — M6281 Muscle weakness (generalized): Secondary | ICD-10-CM | POA: Diagnosis not present

## 2022-08-21 DIAGNOSIS — M25661 Stiffness of right knee, not elsewhere classified: Secondary | ICD-10-CM | POA: Diagnosis not present

## 2022-08-21 DIAGNOSIS — M6281 Muscle weakness (generalized): Secondary | ICD-10-CM | POA: Diagnosis not present

## 2022-08-21 DIAGNOSIS — Z4789 Encounter for other orthopedic aftercare: Secondary | ICD-10-CM | POA: Diagnosis not present

## 2022-08-21 DIAGNOSIS — M25561 Pain in right knee: Secondary | ICD-10-CM | POA: Diagnosis not present

## 2022-08-24 ENCOUNTER — Encounter: Payer: Self-pay | Admitting: Gastroenterology

## 2022-08-24 ENCOUNTER — Ambulatory Visit (AMBULATORY_SURGERY_CENTER): Payer: Medicare Other | Admitting: Gastroenterology

## 2022-08-24 VITALS — BP 145/65 | HR 50 | Temp 96.9°F | Resp 14 | Ht 70.0 in | Wt 231.0 lb

## 2022-08-24 DIAGNOSIS — K317 Polyp of stomach and duodenum: Secondary | ICD-10-CM

## 2022-08-24 DIAGNOSIS — K319 Disease of stomach and duodenum, unspecified: Secondary | ICD-10-CM | POA: Diagnosis not present

## 2022-08-24 DIAGNOSIS — I10 Essential (primary) hypertension: Secondary | ICD-10-CM | POA: Diagnosis not present

## 2022-08-24 DIAGNOSIS — K259 Gastric ulcer, unspecified as acute or chronic, without hemorrhage or perforation: Secondary | ICD-10-CM | POA: Diagnosis not present

## 2022-08-24 DIAGNOSIS — R1319 Other dysphagia: Secondary | ICD-10-CM | POA: Diagnosis not present

## 2022-08-24 DIAGNOSIS — K3189 Other diseases of stomach and duodenum: Secondary | ICD-10-CM | POA: Diagnosis not present

## 2022-08-24 DIAGNOSIS — R1310 Dysphagia, unspecified: Secondary | ICD-10-CM | POA: Diagnosis not present

## 2022-08-24 DIAGNOSIS — G4733 Obstructive sleep apnea (adult) (pediatric): Secondary | ICD-10-CM | POA: Diagnosis not present

## 2022-08-24 MED ORDER — OMEPRAZOLE 40 MG PO CPDR: 40.0000 mg | DELAYED_RELEASE_CAPSULE | Freq: Every day | ORAL | 0 refills | Status: DC

## 2022-08-24 MED ORDER — SODIUM CHLORIDE 0.9 % IV SOLN: 500.0000 mL | Freq: Once | INTRAVENOUS | Status: DC

## 2022-08-24 NOTE — Progress Notes (Signed)
History and Physical Interval Note:  08/24/2022 3:01 PM  Harry Kline  has presented today for endoscopic procedure(s), with the diagnosis of  Encounter Diagnosis  Name Primary?   Esophageal dysphagia Yes  .  The various methods of evaluation and treatment have been discussed with the patient and/or family. After consideration of risks, benefits and other options for treatment, the patient has consented to  the endoscopic procedure(s).   The patient's history has been reviewed, patient examined, no change in status, stable for endoscopic procedure(s).  I have reviewed the patient's chart and labs.  Questions were answered to the patient's satisfaction.     Cyanne Delmar E. Candis Schatz, MD [facility]

## 2022-08-24 NOTE — Op Note (Signed)
[place] Patient Name: Harry Kline Procedure Date: 08/24/2022 2:56 PM MRN: [id number] Endoscopist: Nicki Reaper E. Candis Schatz , MD Age: 88 Referring MD:  Date of Birth: [birth year] Gender: Male Account #: [id number] Procedure:                Upper GI endoscopy Indications:              Dysphagia Medicines:                Monitored Anesthesia Care Procedure:                Pre-Anesthesia Assessment:                           - Prior to the procedure, a History and Physical                            was performed, and patient medications and                            allergies were reviewed. The patient's tolerance of                            previous anesthesia was also reviewed. The risks                            and benefits of the procedure and the sedation                            options and risks were discussed with the patient.                            All questions were answered, and informed consent                            was obtained. Prior Anticoagulants: The patient has                            taken Eliquis (apixaban), last dose was 2 days                            prior to procedure. ASA Grade Assessment: III - A                            patient with severe systemic disease. After                            reviewing the risks and benefits, the patient was                            deemed in satisfactory condition to undergo the                            procedure.  After obtaining informed consent, the endoscope was                            passed under direct vision. Throughout the                            procedure, the patient's blood pressure, pulse, and                            oxygen saturations were monitored continuously. The                            Endoscope was introduced through the mouth, and                            advanced to the third part of duodenum. The upper                            GI  endoscopy was accomplished without difficulty.                            The patient tolerated the procedure well. Scope In: Scope Out: Findings:                 The examined esophagus was moderately tortuous.                           The exam of the esophagus was otherwise normal.                           A TTS dilator was passed through the scope.                            Dilation with an 18-19-20 mm balloon dilator was                            performed to 20 mm at the gastroesophageal                            junction. Estimated blood loss: none.                           Many non-bleeding superficial gastric ulcers with                            no stigmata of bleeding and gastric erythema were                            found in the gastric body and in the gastric                            antrum. The largest lesion was 3 mm in largest  dimension. Biopsies were taken with a cold forceps                            for Helicobacter pylori testing. Estimated blood                            loss was minimal.                           A single 7 mm sessile polypoid lesion with no                            bleeding and no stigmata of recent bleeding was                            found in the cardia. Biopsies were taken with a                            cold forceps for histology. Estimated blood loss                            was minimal.                           The exam of the stomach was otherwise normal.                           The examined duodenum was normal. Complications:            No immediate complications. Estimated Blood Loss:     Estimated blood loss was minimal. Impression:               - Tortuous esophagus.                           - Non-bleeding gastric ulcers with no stigmata of                            bleeding. Biopsied.                           - A single gastric polyp. Biopsied.                           - Normal  examined duodenum.                           - Dilation performed at the gastroesophageal                            junction. Recommendation:           - Patient has a contact number available for                            emergencies. The signs and symptoms of potential  delayed complications were discussed with the                            patient. Return to normal activities tomorrow.                            Written discharge instructions were provided to the                            patient.                           - Resume previous diet.                           - Continue present medications.                           - Await pathology results.                           - Repeat upper endoscopy in 8 weeks to check                            healing.                           - Avoid NSAIDs                           - Use Prilosec (omeprazole) 40 mg PO daily for 8                            weeks.                           - Ok to resume Eliquis tomorrow. Anaisha Mago E. Candis Schatz, MD 08/24/2022 3:35:19 PM This report has been signed electronically.

## 2022-08-24 NOTE — Progress Notes (Signed)
Report to PACU, RN, vss, BBS= Clear.  

## 2022-08-24 NOTE — Patient Instructions (Addendum)
   Ok to resume Eliquis tomorrow (08/25/22)  Avoid Nsaids ( aleve,Ibuprofen,Advil,etc)   Take Prilosec 40 mg once daily for 8 weeks - order sent to your pharmacy [facility]  Repeat Upper Endoscopy in 8 weeks - scheduled (see below for date & time) & instructions given for repeat Endoscopy ( written instructions given)    YOU HAD AN ENDOSCOPIC PROCEDURE TODAY AT [place]:   Refer to the procedure report that was given to you for any specific questions about what was found during the examination.  If the procedure report does not answer your questions, please call your gastroenterologist to clarify.  If you requested that your care partner not be given the details of your procedure findings, then the procedure report has been included in a sealed envelope for you to review at your convenience later.  YOU SHOULD EXPECT: Some feelings of bloating in the abdomen. Passage of more gas than usual.  Walking can help get rid of the air that was put into your GI tract during the procedure and reduce the bloating. If you had a lower endoscopy (such as a colonoscopy or flexible sigmoidoscopy) you may notice spotting of blood in your stool or on the toilet paper. If you underwent a bowel prep for your procedure, you may not have a normal bowel movement for a few days.  Please Note:  You might notice some irritation and congestion in your nose or some drainage.  This is from the oxygen used during your procedure.  There is no need for concern and it should clear up in a day or so.  SYMPTOMS TO REPORT IMMEDIATELY:  Following upper endoscopy (EGD)  Vomiting of blood or coffee ground material  New chest pain or pain under the shoulder blades  Painful or persistently difficult swallowing  New shortness of breath  Fever of 100F or higher  Black, tarry-looking stools  For urgent or emergent issues, a gastroenterologist can be reached at any hour by calling [phone]. Do not  use MyChart messaging for urgent concerns.    DIET:  We do recommend a small meal at first, but then you may proceed to your regular diet.  Drink plenty of fluids but you should avoid alcoholic beverages for 24 hours.  ACTIVITY:  You should plan to take it easy for the rest of today and you should NOT DRIVE or use heavy machinery until tomorrow (because of the sedation medicines used during the test).    FOLLOW UP: Our staff will call the number listed on your records the next business day following your procedure.  We will call around 7:15- 8:00 am to check on you and address any questions or concerns that you may have regarding the information given to you following your procedure. If we do not reach you, we will leave a message.     If any biopsies were taken you will be contacted by phone or by letter within the next 1-3 weeks.  Please call us at [phone] if you have not heard about the biopsies in 3 weeks.    SIGNATURES/CONFIDENTIALITY: You and/or your care partner have signed paperwork which will be entered into your electronic medical record.  These signatures attest to the fact that that the information above on your After Visit Summary has been reviewed and is understood.  Full responsibility of the confidentiality of this discharge information lies with you and/or your care-partner.

## 2022-08-24 NOTE — Progress Notes (Signed)
Called to room to assist during endoscopic procedure.  Patient ID and intended procedure confirmed with present staff. Received instructions for my participation in the procedure from the performing physician.  

## 2022-08-25 ENCOUNTER — Telehealth: Payer: Self-pay | Admitting: *Deleted

## 2022-08-25 DIAGNOSIS — G4733 Obstructive sleep apnea (adult) (pediatric): Secondary | ICD-10-CM | POA: Diagnosis not present

## 2022-08-25 NOTE — Telephone Encounter (Signed)
  Follow up Call-     08/24/2022    2:07 PM  Call back number  Post procedure Call Back phone  # [phone]  Permission to leave phone message Yes     Patient questions:  Do you have a fever, pain , or abdominal swelling? No. Pain Score  0 *  Have you tolerated food without any problems? Yes.    Have you been able to return to your normal activities? Yes.    Do you have any questions about your discharge instructions: Diet   No. Medications  No. Follow up visit  No.  Do you have questions or concerns about your Care? No.  Actions: * If pain score is 4 or above: No action needed, pain <4.

## 2022-08-26 DIAGNOSIS — M25561 Pain in right knee: Secondary | ICD-10-CM | POA: Diagnosis not present

## 2022-08-26 DIAGNOSIS — M6281 Muscle weakness (generalized): Secondary | ICD-10-CM | POA: Diagnosis not present

## 2022-08-26 DIAGNOSIS — Z4789 Encounter for other orthopedic aftercare: Secondary | ICD-10-CM | POA: Diagnosis not present

## 2022-08-26 DIAGNOSIS — M25661 Stiffness of right knee, not elsewhere classified: Secondary | ICD-10-CM | POA: Diagnosis not present

## 2022-08-28 DIAGNOSIS — G4733 Obstructive sleep apnea (adult) (pediatric): Secondary | ICD-10-CM | POA: Diagnosis not present

## 2022-08-28 DIAGNOSIS — Z4789 Encounter for other orthopedic aftercare: Secondary | ICD-10-CM | POA: Diagnosis not present

## 2022-08-28 DIAGNOSIS — M25561 Pain in right knee: Secondary | ICD-10-CM | POA: Diagnosis not present

## 2022-08-28 DIAGNOSIS — M6281 Muscle weakness (generalized): Secondary | ICD-10-CM | POA: Diagnosis not present

## 2022-08-28 DIAGNOSIS — M25661 Stiffness of right knee, not elsewhere classified: Secondary | ICD-10-CM | POA: Diagnosis not present

## 2022-08-31 NOTE — Progress Notes (Signed)
Harry Kline, The biopsies taken from your stomach were notable for mild reactive gastropathy which is a common finding and often related to use of certain medications (usually NSAIDs), but there was no evidence of Helicobacter pylori infection. Please avoid NSAIDs if at all possible (use Tylenol preferentially for joint/muscle pain) and take the omeprazole every day as discussed. Plan to repeat EGD in 8 weeks to check healing of the stomach ulcers.

## 2022-09-02 ENCOUNTER — Encounter: Payer: Self-pay | Admitting: Family Medicine

## 2022-09-03 ENCOUNTER — Ambulatory Visit (INDEPENDENT_AMBULATORY_CARE_PROVIDER_SITE_OTHER): Payer: Medicare Other | Admitting: Medical-Surgical

## 2022-09-03 VITALS — Temp 97.1°F | Ht 70.0 in | Wt 231.0 lb

## 2022-09-03 DIAGNOSIS — Z23 Encounter for immunization: Secondary | ICD-10-CM

## 2022-09-03 NOTE — Progress Notes (Signed)
Agree with documentation as below.  ___________________________________________ Romonda Parker L. Triton Heidrich, DNP, APRN, FNP-BC Primary Care and Sports Medicine [facility]  

## 2022-09-03 NOTE — Progress Notes (Signed)
Pt presents to day for immunization. No allergy to eggs or latex.   Location: LD  Pt tolerated well.   

## 2022-09-08 ENCOUNTER — Ambulatory Visit (INDEPENDENT_AMBULATORY_CARE_PROVIDER_SITE_OTHER): Payer: Medicare Other

## 2022-09-08 DIAGNOSIS — I440 Atrioventricular block, first degree: Secondary | ICD-10-CM

## 2022-09-09 DIAGNOSIS — D1801 Hemangioma of skin and subcutaneous tissue: Secondary | ICD-10-CM | POA: Diagnosis not present

## 2022-09-09 DIAGNOSIS — Z85828 Personal history of other malignant neoplasm of skin: Secondary | ICD-10-CM | POA: Diagnosis not present

## 2022-09-09 DIAGNOSIS — G4733 Obstructive sleep apnea (adult) (pediatric): Secondary | ICD-10-CM | POA: Diagnosis not present

## 2022-09-09 DIAGNOSIS — D692 Other nonthrombocytopenic purpura: Secondary | ICD-10-CM | POA: Diagnosis not present

## 2022-09-09 DIAGNOSIS — L821 Other seborrheic keratosis: Secondary | ICD-10-CM | POA: Diagnosis not present

## 2022-09-10 LAB — CUP PACEART REMOTE DEVICE CHECK
Battery Remaining Longevity: 96 mo
Battery Voltage: 3.01 V
Brady Statistic RV Percent Paced: 70.51 %
Date Time Interrogation Session: 20231011213200
Implantable Pulse Generator Implant Date: 20210128
Lead Channel Impedance Value: 510 Ohm
Lead Channel Pacing Threshold Amplitude: 0.375 V
Lead Channel Pacing Threshold Pulse Width: 0.24 ms
Lead Channel Sensing Intrinsic Amplitude: 7.313 mV
Lead Channel Setting Pacing Amplitude: 0.875
Lead Channel Setting Pacing Pulse Width: 0.24 ms
Lead Channel Setting Sensing Sensitivity: 2 mV

## 2022-09-16 ENCOUNTER — Ambulatory Visit (INDEPENDENT_AMBULATORY_CARE_PROVIDER_SITE_OTHER): Payer: Medicare Other | Admitting: Surgical

## 2022-09-16 ENCOUNTER — Encounter: Payer: Self-pay | Admitting: Orthopedic Surgery

## 2022-09-16 DIAGNOSIS — Z96651 Presence of right artificial knee joint: Secondary | ICD-10-CM

## 2022-09-16 NOTE — Progress Notes (Signed)
Post-Op Visit Note   Patient: Harry Kline           Date of Birth: 1934/05/28           MRN: [id number] Visit Date: 09/16/2022 PCP: Luetta Nutting, DO   Assessment & Plan:  Chief Complaint:  Chief Complaint  Patient presents with   Right Knee - Routine Post Op     06/23/22 (12w 1d) Right Total Knee Arthroplasty      Visit Diagnoses: No diagnosis found.  Plan: Patient is a 88 year old male who returns s/p right total knee arthroplasty on 06/23/2022.  Doing very well without any significant issues.  He has finished physical therapy.  He does have occasional soreness if he overworks his knee which he feels happened at the [facility] last week.  Felt a twinge while doing the leg press but is 80% improved since that event with no residual deficits.  He is walking around his neighborhood for short distances and sticking close to the house just in case anything goes wrong but he is working on expanding his range.  Walks about 1/4 mile.  Thinking about hooking up his treadmill in order to increase his endurance.  Denies any fevers, chills, night sweats, chest pain, shortness of breath.  On exam, patient has about 5 degrees extension in clinic today.  Flexes to 120 degrees.  No effusion.  Incisions well-healed.  No calf tenderness.  Next Homans' sign.  Excellent quad strength rated 5/5.  Ambulates without antalgia or any giving way of the leg.  Plan is to continue with home exercise program at the [facility] and follow-up with the office as needed.  He is counseled on antibiotic dental prophylaxis.  He understands that it can take 12 months to see full recovery from knee replacement surgery.  Follow-up with the office with any concerns.  Follow-Up Instructions: No follow-ups on file.   Orders:  No orders of the defined types were placed in this encounter.  No orders of the defined types were placed in this encounter.   Imaging: No results found.  PMFS History: Patient Active Problem List    Diagnosis Date Noted   Arthritis of knee 06/23/2022   OA (osteoarthritis) of knee 06/23/2022   History of squamous cell carcinoma 09/09/2020   Pacemaker 02/19/2020   History of lumbar fusion 02/19/2020   Hyperlipidemia 02/19/2020   BPH (benign prostatic hyperplasia) 02/19/2020   Hypertension 02/19/2020   Atrial flutter ([place]) 02/19/2020   Paroxysmal atrial fibrillation ([place]) 02/19/2020   Mitral valve regurgitation 02/19/2020   First degree AV block 02/19/2020   Anxiety 02/19/2020   Obesity (BMI 30.0-34.9) 02/19/2020   Insomnia 02/19/2020   Past Medical History:  Diagnosis Date   Arthritis 2010   Diagnosed but not enough to treat with medication.   Atrial fibrillation (HCC)    Atrial fibrillation (HCC)    CAD (coronary artery disease)    Cancer ([place])    Cataract 2009   surgery both eyes   Chronic kidney disease    GERD (gastroesophageal reflux disease) 2000   Hyperlipidemia    Hypertension    OSA (obstructive sleep apnea)    Pacemaker    Medtronic-   Skin cancer    Sleep apnea 1990   c-pap machine   Status post dilation of esophageal narrowing     Family History  Problem Relation Age of Onset   Breast cancer Mother    Cancer Mother    Heart failure Father    Heart  disease Father    Healthy Sister    Diabetes Paternal Grandmother    Heart failure Paternal Grandfather    Heart disease Paternal Grandfather    Stomach cancer Neg Hx    Esophageal cancer Neg Hx    Colon cancer Neg Hx    Rectal cancer Neg Hx     Past Surgical History:  Procedure Laterality Date   COLONOSCOPY     CORONARY ARTERY BYPASS GRAFT     November 2003   [place]   RK surgery   SPINE SURGERY  2014   L4,L5 fusion   TONSILLECTOMY     TOTAL KNEE ARTHROPLASTY Right 06/23/2022   Procedure: RIGHT TOTAL KNEE ARTHROPLASTY;  Surgeon: Meredith Pel, MD;  Location: [place];  Service: Orthopedics;  Laterality: Right;   UPPER GASTROINTESTINAL ENDOSCOPY     Social History    Occupational History   Occupation: Retired.   Occupation: retired  Tobacco Use   Smoking status: Former    Packs/day: 1.00    Years: 20.00    Total pack years: 20.00    Types: Cigarettes    Quit date: 11/30/1973    Years since quitting: 48.8   Smokeless tobacco: Never  Vaping Use   Vaping Use: Never used  Substance and Sexual Activity   Alcohol use: Yes    Alcohol/week: 2.0 standard drinks of alcohol    Types: 2 Cans of beer per week    Comment: a beer 3-4 a week   Drug use: Never   Sexual activity: Not Currently

## 2022-09-23 NOTE — Progress Notes (Signed)
Remote pacemaker transmission.   

## 2022-10-13 ENCOUNTER — Encounter: Payer: Self-pay | Admitting: Gastroenterology

## 2022-10-19 ENCOUNTER — Ambulatory Visit (AMBULATORY_SURGERY_CENTER): Payer: Medicare Other | Admitting: Gastroenterology

## 2022-10-19 ENCOUNTER — Encounter: Payer: Self-pay | Admitting: Gastroenterology

## 2022-10-19 ENCOUNTER — Ambulatory Visit: Payer: Medicare Other | Admitting: Family Medicine

## 2022-10-19 VITALS — BP 109/51 | HR 54 | Temp 96.2°F | Resp 14 | Ht 70.0 in | Wt 231.0 lb

## 2022-10-19 DIAGNOSIS — I2510 Atherosclerotic heart disease of native coronary artery without angina pectoris: Secondary | ICD-10-CM | POA: Diagnosis not present

## 2022-10-19 DIAGNOSIS — K222 Esophageal obstruction: Secondary | ICD-10-CM

## 2022-10-19 DIAGNOSIS — K219 Gastro-esophageal reflux disease without esophagitis: Secondary | ICD-10-CM | POA: Diagnosis not present

## 2022-10-19 DIAGNOSIS — R1319 Other dysphagia: Secondary | ICD-10-CM | POA: Diagnosis not present

## 2022-10-19 DIAGNOSIS — K259 Gastric ulcer, unspecified as acute or chronic, without hemorrhage or perforation: Secondary | ICD-10-CM

## 2022-10-19 DIAGNOSIS — R1310 Dysphagia, unspecified: Secondary | ICD-10-CM | POA: Diagnosis not present

## 2022-10-19 MED ORDER — SODIUM CHLORIDE 0.9 % IV SOLN: 500.0000 mL | Freq: Once | INTRAVENOUS | Status: DC

## 2022-10-19 NOTE — Op Note (Signed)
[place] Patient Name: Harry Kline Procedure Date: 10/19/2022 1:30 PM MRN: [id number] Endoscopist: Nicki Reaper E. Candis Schatz , MD, [id number] Age: 88 Referring MD:  Date of Birth: 10-03-1934 Gender: Male Account #: [id number] Procedure:                Upper GI endoscopy Indications:              Follow-up of gastric ulcer: EGD in September to                            evaluate dysphagia notable for numerous small                            gastric ulcers in the antrum and cardia. H. pylori                            neg. Patient has been taking omeprazole. Medicines:                Monitored Anesthesia Care Procedure:                Pre-Anesthesia Assessment:                           - Prior to the procedure, a History and Physical                            was performed, and patient medications and                            allergies were reviewed. The patient's tolerance of                            previous anesthesia was also reviewed. The risks                            and benefits of the procedure and the sedation                            options and risks were discussed with the patient.                            All questions were answered, and informed consent                            was obtained. Prior Anticoagulants: The patient has                            taken Eliquis (apixaban), last dose was 2 days                            prior to procedure. ASA Grade Assessment: III - A                            patient with severe systemic disease. After  reviewing the risks and benefits, the patient was                            deemed in satisfactory condition to undergo the                            procedure.                           After obtaining informed consent, the endoscope was                            passed under direct vision. Throughout the                            procedure, the patient's blood pressure,  pulse, and                            oxygen saturations were monitored continuously. The                            Endoscope was introduced through the mouth, and                            advanced to the third part of duodenum. The upper                            GI endoscopy was accomplished without difficulty.                            The patient tolerated the procedure well. Scope In: Scope Out: Findings:                 The examined portions of the nasopharynx,                            oropharynx and larynx were normal.                           The examined esophagus was significantly tortuous.                           A single medium-sized bleb was found in the upper                            third of the esophagus.                           The exam of the esophagus was otherwise normal.                           Scattered mild inflammation characterized by                            erythema was found in the gastric body.  The exam of the stomach was otherwise normal.                           The examined duodenum was normal. Complications:            No immediate complications. Estimated Blood Loss:     Estimated blood loss: none. Impression:               - The examined portions of the nasopharynx,                            oropharynx and larynx were normal.                           - Tortuous esophagus. This is likely contributing                            to the patient's dysphagia.                           - Bleb found in the esophagus. No further                            evaluation recommended.                           - Gastritis. The previously noted ulcerations have                            healed                           - Normal examined duodenum.                           - No specimens collected. Recommendation:           - Patient has a contact number available for                            emergencies. The signs and  symptoms of potential                            delayed complications were discussed with the                            patient. Return to normal activities tomorrow.                            Written discharge instructions were provided to the                            patient.                           - Resume previous diet.                           -  Continue present medications.                           - Follow up as needed for further                            evaluation/management of dysphagia.                           - Resume Eliquis (apixaban) at prior dose tomorrow. Harry Kline E. Candis Schatz, MD 10/19/2022 1:49:57 PM This report has been signed electronically.

## 2022-10-19 NOTE — Progress Notes (Signed)
[facility] History and Physical   Primary Care Physician:  Luetta Nutting, DO   Reason for Procedure:   Follow up gastric ulcers  Plan:    EGD     HPI: Harry Kline is a 88 y.o. male undergoing repeat EGD to assess healing of previously noted gastric ulcers.  He has been taking omeprazole daily.  No symptoms of epigastric pain or nausea/vomiting.  He takes Eliquis for a-fib, last dose Nov 18.  Past Medical History:  Diagnosis Date   Arthritis 2010   Diagnosed but not enough to treat with medication.   Atrial fibrillation (HCC)    Atrial fibrillation (HCC)    CAD (coronary artery disease)    Cancer ([place])    Cataract 2009   surgery both eyes   Chronic kidney disease    GERD (gastroesophageal reflux disease) 2000   Hyperlipidemia    Hypertension    OSA (obstructive sleep apnea)    Pacemaker    Medtronic-   Skin cancer    Sleep apnea 1990   c-pap machine   Status post dilation of esophageal narrowing     Past Surgical History:  Procedure Laterality Date   COLONOSCOPY     CORONARY ARTERY BYPASS GRAFT     November 2003   [place]   RK surgery   SPINE SURGERY  2014   L4,L5 fusion   TONSILLECTOMY     TOTAL KNEE ARTHROPLASTY Right 06/23/2022   Procedure: RIGHT TOTAL KNEE ARTHROPLASTY;  Surgeon: Meredith Pel, MD;  Location: [place];  Service: Orthopedics;  Laterality: Right;   UPPER GASTROINTESTINAL ENDOSCOPY      Prior to Admission medications   Medication Sig Start Date End Date Taking? Authorizing Provider  Ascorbic Acid (VITAMIN C) 1000 MG tablet Take 1,000 mg by mouth 4 (four) times a week. 04/30/20  Yes [provider]  atorvastatin (LIPITOR) 20 MG tablet Take 1 tablet (20 mg total) by mouth daily. 07/20/22  Yes Luetta Nutting, DO  b complex vitamins capsule Take 1 capsule by mouth daily.   Yes [provider]  Coenzyme Q10 (CO Q 10) 100 MG CAPS Take 100 mg by mouth daily.   Yes [provider]  docusate sodium  (COLACE) 100 MG capsule Take 100 mg by mouth 2 (two) times daily.   Yes [provider]  furosemide (LASIX) 40 MG tablet Take 1 tablet (40 mg total) by mouth daily. 07/20/22  Yes Luetta Nutting, DO  Melatonin 10 MG CAPS Take 10 mg by mouth at bedtime.   Yes [provider]  Multiple Vitamin (MULTIVITAMIN) tablet Take 1 tablet by mouth daily.   Yes [provider]  omeprazole (PRILOSEC) 40 MG capsule Take 1 capsule (40 mg total) by mouth daily. Once a day for 8 weeks 08/24/22  Yes Daryel November, MD  potassium chloride (KLOR-CON) 10 MEQ tablet Take 1 tablet (10 mEq total) by mouth daily. 07/20/22  Yes Luetta Nutting, DO  tamsulosin (FLOMAX) 0.4 MG CAPS capsule Take 1 capsule (0.4 mg total) by mouth daily. 07/20/22  Yes Luetta Nutting, DO  traZODone (DESYREL) 50 MG tablet Take 1-2 tablets (50-100 mg total) by mouth at bedtime as needed for sleep. 07/20/22  Yes Luetta Nutting, DO  apixaban (ELIQUIS) 5 MG TABS tablet Take 1 tablet (5 mg total) by mouth 2 (two) times daily. 09/08/21   Constance Haw, MD    Current Outpatient Medications  Medication Sig Dispense Refill   Ascorbic Acid (VITAMIN  C) 1000 MG tablet Take 1,000 mg by mouth 4 (four) times a week.     atorvastatin (LIPITOR) 20 MG tablet Take 1 tablet (20 mg total) by mouth daily. 90 tablet 3   b complex vitamins capsule Take 1 capsule by mouth daily.     Coenzyme Q10 (CO Q 10) 100 MG CAPS Take 100 mg by mouth daily.     docusate sodium (COLACE) 100 MG capsule Take 100 mg by mouth 2 (two) times daily.     furosemide (LASIX) 40 MG tablet Take 1 tablet (40 mg total) by mouth daily. 90 tablet 3   Melatonin 10 MG CAPS Take 10 mg by mouth at bedtime.     Multiple Vitamin (MULTIVITAMIN) tablet Take 1 tablet by mouth daily.     omeprazole (PRILOSEC) 40 MG capsule Take 1 capsule (40 mg total) by mouth daily. Once a day for 8 weeks 90 capsule 0   potassium chloride (KLOR-CON) 10 MEQ tablet Take 1 tablet (10 mEq total)  by mouth daily. 90 tablet 3   tamsulosin (FLOMAX) 0.4 MG CAPS capsule Take 1 capsule (0.4 mg total) by mouth daily. 90 capsule 3   traZODone (DESYREL) 50 MG tablet Take 1-2 tablets (50-100 mg total) by mouth at bedtime as needed for sleep. 90 tablet 0   apixaban (ELIQUIS) 5 MG TABS tablet Take 1 tablet (5 mg total) by mouth 2 (two) times daily. 180 tablet 1   Current Facility-Administered Medications  Medication Dose Route Frequency Provider Last Rate Last Admin   0.9 %  sodium chloride infusion  500 mL Intravenous Once Daryel November, MD        Allergies as of 10/19/2022   (No Known Allergies)    Family History  Problem Relation Age of Onset   Breast cancer Mother    Cancer Mother    Heart failure Father    Heart disease Father    Healthy Sister    Diabetes Paternal Grandmother    Heart failure Paternal Grandfather    Heart disease Paternal Grandfather    Stomach cancer Neg Hx    Esophageal cancer Neg Hx    Colon cancer Neg Hx    Rectal cancer Neg Hx     Social History   Socioeconomic History   Marital status: Widowed    Spouse name: Not on file   Number of children: 3   Years of education: 16   Highest education level: Bachelor's degree (e.g., BA, AB, BS)  Occupational History   Occupation: Retired.   Occupation: retired  Tobacco Use   Smoking status: Former    Packs/day: 1.00    Years: 20.00    Total pack years: 20.00    Types: Cigarettes    Quit date: 11/30/1973    Years since quitting: 48.9   Smokeless tobacco: Never  Vaping Use   Vaping Use: Never used  Substance and Sexual Activity   Alcohol use: Yes    Alcohol/week: 2.0 standard drinks of alcohol    Types: 2 Cans of beer per week    Comment: a beer 3-4 a week   Drug use: Never   Sexual activity: Not Currently  Other Topics Concern   Not on file  Social History Narrative   Lives alone. He has three children. He enjoys traveling.   Social Determinants of Health   Financial Resource Strain:  Low Risk  (01/14/2022)   Overall Financial Resource Strain (CARDIA)    Difficulty of Paying Living Expenses: Not  hard at all  Food Insecurity: No Food Insecurity (01/14/2022)   Hunger Vital Sign    Worried About Running Out of Food in the Last Year: Never true    Ran Out of Food in the Last Year: Never true  Transportation Needs: No Transportation Needs (01/14/2022)   PRAPARE - Hydrologist (Medical): No    Lack of Transportation (Non-Medical): No  Physical Activity: Sufficiently Active (01/14/2022)   Exercise Vital Sign    Days of Exercise per Week: 5 days    Minutes of Exercise per Session: 90 min  Stress: No Stress Concern Present (01/14/2022)   [place]    Feeling of Stress : Not at all  Social Connections: Moderately Integrated (01/14/2022)   Social Connection and Isolation Panel [NHANES]    Frequency of Communication with Friends and Family: More than three times a week    Frequency of Social Gatherings with Friends and Family: More than three times a week    Attends Religious Services: More than 4 times per year    Active Member of [employer] or Organizations: Yes    Attends Archivist Meetings: More than 4 times per year    Marital Status: Widowed  Intimate Partner Violence: Not At Risk (01/14/2022)   Humiliation, Afraid, Rape, and Kick questionnaire    Fear of Current or Ex-Partner: No    Emotionally Abused: No    Physically Abused: No    Sexually Abused: No    Review of Systems:  All other review of systems negative except as mentioned in the HPI.  Physical Exam: Vital signs BP 132/62   Pulse (!) 58   Temp (!) 96.2 F (35.7 C)   Ht '5\' 10"'$  (1.778 m)   Wt 231 lb (104.8 kg)   SpO2 99%   BMI 33.15 kg/m   General:   Alert,  Well-developed, well-nourished, pleasant and cooperative in NAD Airway:  Mallampati 3 Lungs:  Clear throughout to auscultation.   Heart:  Regular  rate and rhythm; no murmurs, clicks, rubs,  or gallops. Abdomen:  Soft, nontender and nondistended. Normal bowel sounds.   Neuro/Psych:  Normal mood and affect. A and O x 3   Kacey Dysert E. Candis Schatz, MD [facility]

## 2022-10-19 NOTE — Patient Instructions (Signed)
Resume your Eliquis tomorrow at the previous dose.  Read all of the handouts given to you by your recovery room nurse.  Drop down to 20 mgs daily for your GERD medication.  YOU HAD AN ENDOSCOPIC PROCEDURE TODAY AT [facility]:   Refer to the procedure report that was given to you for any specific questions about what was found during the examination.  If the procedure report does not answer your questions, please call your gastroenterologist to clarify.  If you requested that your care partner not be given the details of your procedure findings, then the procedure report has been included in a sealed envelope for you to review at your convenience later.  YOU SHOULD EXPECT: Some feelings of bloating in the abdomen. Passage of more gas than usual.  Walking can help get rid of the air that was put into your GI tract during the procedure and reduce the bloating.   Please Note:  You might notice some irritation and congestion in your nose or some drainage.  This is from the oxygen used during your procedure.  There is no need for concern and it should clear up in a day or so.  SYMPTOMS TO REPORT IMMEDIATELY:   Following upper endoscopy (EGD)  Vomiting of blood or coffee ground material  New chest pain or pain under the shoulder blades  Painful or persistently difficult swallowing  New shortness of breath  Fever of 100F or higher  Black, tarry-looking stools  For urgent or emergent issues, a gastroenterologist can be reached at any hour by calling [phone]. Do not use MyChart messaging for urgent concerns.    DIET:  We do recommend a small meal at first, but then you may proceed to your regular diet.  Drink plenty of fluids but you should avoid alcoholic beverages for 24 hours.  Try to avoid spicy and acidic foods.  ACTIVITY:  You should plan to take it easy for the rest of today and you should NOT DRIVE or use heavy machinery until tomorrow (because of the sedation  medicines used during the test).    FOLLOW UP: Our staff will call the number listed on your records the next business day following your procedure.  We will call around 7:15- 8:00 am to check on you and address any questions or concerns that you may have regarding the information given to you following your procedure. If we do not reach you, we will leave a message.      SIGNATURES/CONFIDENTIALITY: You and/or your care partner have signed paperwork which will be entered into your electronic medical record.  These signatures attest to the fact that that the information above on your After Visit Summary has been reviewed and is understood.  Full responsibility of the confidentiality of this discharge information lies with you and/or your care-partner.

## 2022-10-19 NOTE — Progress Notes (Signed)
Report to PACU, RN, vss, BBS= Clear.  

## 2022-10-19 NOTE — Progress Notes (Signed)
Pt's states no medical or surgical changes since previsit or office visit. 

## 2022-10-20 ENCOUNTER — Telehealth: Payer: Self-pay | Admitting: *Deleted

## 2022-10-20 NOTE — Telephone Encounter (Signed)
Attempted to call patient for their post-procedure follow-up call. No answer. Left voicemail.   

## 2022-10-21 ENCOUNTER — Ambulatory Visit (INDEPENDENT_AMBULATORY_CARE_PROVIDER_SITE_OTHER): Payer: Medicare Other

## 2022-10-21 ENCOUNTER — Ambulatory Visit: Payer: Medicare Other | Admitting: Orthopedic Surgery

## 2022-10-21 DIAGNOSIS — M545 Low back pain, unspecified: Secondary | ICD-10-CM | POA: Diagnosis not present

## 2022-10-21 DIAGNOSIS — M25561 Pain in right knee: Secondary | ICD-10-CM

## 2022-10-24 ENCOUNTER — Encounter: Payer: Self-pay | Admitting: Orthopedic Surgery

## 2022-10-24 NOTE — Progress Notes (Signed)
Office Visit Note   Patient: Harry Kline           Date of Birth: 04-29-1934           MRN: [id number] Visit Date: 10/21/2022 Requested by: Luetta Nutting, [address],  [place] [id number] PCP: Luetta Nutting, DO  Subjective: Chief Complaint  Patient presents with   Lower Back - Pain   Right Leg - Pain    HPI: Axel Frisk is a 88 y.o. male who presents to the office reporting left leg pain.  Had right total knee replacement July 28.  Having a lot of pain running down to the ankle and thigh.  Has a history of L4-5 fusion 15 years ago.  No real pain radiating into the back region.  Having mostly medial sided leg pain with burning sensation.  Has tried over-the-counter medication as well as some stretching exercises which he did after his back surgery.  Overall doing well from the knee replacement..                ROS: All systems reviewed are negative as they relate to the chief complaint within the history of present illness.  Patient denies fevers or chills.  Assessment & Plan: Visit Diagnoses:  1. Low back pain, unspecified back pain laterality, unspecified chronicity, unspecified whether sciatica present     Plan: Impression is right leg radiculopathy with well-functioning total knee replacement and history of back fusion.  Ongoing now for 8 weeks without relief from conservative measures.  Plan MRI lumbar spine to evaluate right-sided radiculopathy with possible L2-3 adjacent segment disease.  Follow-up after that study.  Follow-Up Instructions: No follow-ups on file.   Orders:  Orders Placed This Encounter  Procedures   XR Lumbar Spine 2-3 Views   XR KNEE 3 VIEW RIGHT   MR Lumbar Spine w/o contrast   No orders of the defined types were placed in this encounter.     Procedures: No procedures performed   Clinical Data: No additional findings.  Objective: Vital Signs: There were no vitals taken for this visit.  Physical Exam:   Constitutional: Patient appears well-developed HEENT:  Head: Normocephalic Eyes:EOM are normal Neck: Normal range of motion Cardiovascular: Normal rate Pulmonary/chest: Effort normal Neurologic: Patient is alert Skin: Skin is warm Psychiatric: Patient has normal mood and affect  Ortho Exam: Ortho exam demonstrates no groin pain on the right with internal/external rotation of the leg does have some medial paresthesias running in the saphenous distribution from groin to medial ankle.  Right knee has no effusion excellent range of motion intact quad strength intact extensor mechanism.  No definite nerve root tension signs on the right or left-hand side.  No trochanteric tenderness is present.  Minimal back tenderness to direct palpation.  Specialty Comments:  No specialty comments available.  Imaging: No results found.   PMFS History: Patient Active Problem List   Diagnosis Date Noted   Arthritis of knee 06/23/2022   OA (osteoarthritis) of knee 06/23/2022   History of squamous cell carcinoma 09/09/2020   Pacemaker 02/19/2020   History of lumbar fusion 02/19/2020   Hyperlipidemia 02/19/2020   BPH (benign prostatic hyperplasia) 02/19/2020   Hypertension 02/19/2020   Atrial flutter ([place]) 02/19/2020   Paroxysmal atrial fibrillation ([place]) 02/19/2020   Mitral valve regurgitation 02/19/2020   First degree AV block 02/19/2020   Anxiety 02/19/2020   Obesity (BMI 30.0-34.9) 02/19/2020   Insomnia 02/19/2020   Past Medical History:  Diagnosis Date   Arthritis 2010   Diagnosed but not enough to treat with medication.   Atrial fibrillation (HCC)    Atrial fibrillation (HCC)    CAD (coronary artery disease)    Cancer ([place])    Cataract 2009   surgery both eyes   Chronic kidney disease    GERD (gastroesophageal reflux disease) 2000   Hyperlipidemia    Hypertension    OSA (obstructive sleep apnea)    Pacemaker    Medtronic-   Skin cancer    Sleep apnea 1990   c-pap machine    Status post dilation of esophageal narrowing     Family History  Problem Relation Age of Onset   Breast cancer Mother    Cancer Mother    Heart failure Father    Heart disease Father    Healthy Sister    Diabetes Paternal Grandmother    Heart failure Paternal Grandfather    Heart disease Paternal Grandfather    Stomach cancer Neg Hx    Esophageal cancer Neg Hx    Colon cancer Neg Hx    Rectal cancer Neg Hx     Past Surgical History:  Procedure Laterality Date   COLONOSCOPY     CORONARY ARTERY BYPASS GRAFT     November 2003   [place]   RK surgery   SPINE SURGERY  2014   L4,L5 fusion   TONSILLECTOMY     TOTAL KNEE ARTHROPLASTY Right 06/23/2022   Procedure: RIGHT TOTAL KNEE ARTHROPLASTY;  Surgeon: Meredith Pel, MD;  Location: [facility];  Service: Orthopedics;  Laterality: Right;   UPPER GASTROINTESTINAL ENDOSCOPY     Social History   Occupational History   Occupation: Retired.   Occupation: retired  Tobacco Use   Smoking status: Former    Packs/day: 1.00    Years: 20.00    Total pack years: 20.00    Types: Cigarettes    Quit date: 11/30/1973    Years since quitting: 48.9   Smokeless tobacco: Never  Vaping Use   Vaping Use: Never used  Substance and Sexual Activity   Alcohol use: Yes    Alcohol/week: 2.0 standard drinks of alcohol    Types: 2 Cans of beer per week    Comment: a beer 3-4 a week   Drug use: Never   Sexual activity: Not Currently

## 2022-10-26 DIAGNOSIS — G4733 Obstructive sleep apnea (adult) (pediatric): Secondary | ICD-10-CM | POA: Diagnosis not present

## 2022-11-12 DIAGNOSIS — G4733 Obstructive sleep apnea (adult) (pediatric): Secondary | ICD-10-CM | POA: Diagnosis not present

## 2022-11-12 NOTE — Telephone Encounter (Signed)
Emailed Vivien Rota with [facility] schduling they will contact pt to scheudle appt

## 2022-11-18 ENCOUNTER — Encounter: Payer: Self-pay | Admitting: Cardiology

## 2022-11-18 NOTE — Telephone Encounter (Signed)
Error

## 2022-11-19 ENCOUNTER — Encounter: Payer: Self-pay | Admitting: *Deleted

## 2022-12-08 ENCOUNTER — Ambulatory Visit (INDEPENDENT_AMBULATORY_CARE_PROVIDER_SITE_OTHER): Payer: BLUE CROSS/BLUE SHIELD

## 2022-12-08 DIAGNOSIS — I440 Atrioventricular block, first degree: Secondary | ICD-10-CM | POA: Diagnosis not present

## 2022-12-09 LAB — CUP PACEART REMOTE DEVICE CHECK
Battery Remaining Longevity: 96 mo
Battery Voltage: 3.01 V
Brady Statistic RV Percent Paced: 69.89 %
Date Time Interrogation Session: 20240110124400
Implantable Pulse Generator Implant Date: 20210128
Lead Channel Impedance Value: 550 Ohm
Lead Channel Pacing Threshold Amplitude: 0.5 V
Lead Channel Pacing Threshold Pulse Width: 0.24 ms
Lead Channel Sensing Intrinsic Amplitude: 8.1 mV
Lead Channel Setting Pacing Amplitude: 1 V
Lead Channel Setting Pacing Pulse Width: 0.24 ms
Lead Channel Setting Sensing Sensitivity: 2 mV

## 2022-12-15 DIAGNOSIS — G4733 Obstructive sleep apnea (adult) (pediatric): Secondary | ICD-10-CM | POA: Diagnosis not present

## 2022-12-16 DIAGNOSIS — G4733 Obstructive sleep apnea (adult) (pediatric): Secondary | ICD-10-CM | POA: Diagnosis not present

## 2022-12-17 NOTE — Progress Notes (Signed)
HPI: FU CAD and atrial fibrillation. He previously lived in [place]; S/P coronary artery bypass and graft approximately 15 years ago. Also with history of permanent atrial fibrillation/flutter and has had previous pacemaker.  Echocardiogram December 2021 showed normal LV function, severe left atrial enlargement, mild right atrial enlargement, mild mitral regurgitation.  Since last seen the patient denies any dyspnea on exertion, orthopnea, PND, pedal edema, palpitations, syncope or chest pain.   Current Outpatient Medications  Medication Sig Dispense Refill   apixaban (ELIQUIS) 5 MG TABS tablet Take 1 tablet (5 mg total) by mouth 2 (two) times daily. 180 tablet 1   Ascorbic Acid (VITAMIN C) 1000 MG tablet Take 1,000 mg by mouth 4 (four) times a week.     atorvastatin (LIPITOR) 20 MG tablet Take 1 tablet (20 mg total) by mouth daily. 90 tablet 3   b complex vitamins capsule Take 1 capsule by mouth daily.     Coenzyme Q10 (CO Q 10) 100 MG CAPS Take 100 mg by mouth daily.     docusate sodium (COLACE) 100 MG capsule Take 100 mg by mouth 2 (two) times daily.     furosemide (LASIX) 40 MG tablet Take 1 tablet (40 mg total) by mouth daily. 90 tablet 3   Melatonin 10 MG CAPS Take 10 mg by mouth at bedtime.     Multiple Vitamin (MULTIVITAMIN) tablet Take 1 tablet by mouth daily.     omeprazole (PRILOSEC) 40 MG capsule Take 1 capsule (40 mg total) by mouth daily. Once a day for 8 weeks 90 capsule 0   potassium chloride (KLOR-CON) 10 MEQ tablet Take 1 tablet (10 mEq total) by mouth daily. 90 tablet 3   tamsulosin (FLOMAX) 0.4 MG CAPS capsule Take 1 capsule (0.4 mg total) by mouth daily. 90 capsule 3   traZODone (DESYREL) 50 MG tablet Take 1-2 tablets (50-100 mg total) by mouth at bedtime as needed for sleep. 90 tablet 0   No current facility-administered medications for this visit.     Past Medical History:  Diagnosis Date   Arthritis 2010   Diagnosed but not enough to treat with medication.    Atrial fibrillation (HCC)    Atrial fibrillation (HCC)    CAD (coronary artery disease)    Cancer ([place])    Cataract 2009   surgery both eyes   Chronic kidney disease    GERD (gastroesophageal reflux disease) 2000   Hyperlipidemia    Hypertension    OSA (obstructive sleep apnea)    Pacemaker    Medtronic-   Skin cancer    Sleep apnea 1990   c-pap machine   Status post dilation of esophageal narrowing     Past Surgical History:  Procedure Laterality Date   COLONOSCOPY     CORONARY ARTERY BYPASS GRAFT     November 2003   [place]   RK surgery   SPINE SURGERY  2014   L4,L5 fusion   TONSILLECTOMY     TOTAL KNEE ARTHROPLASTY Right 06/23/2022   Procedure: RIGHT TOTAL KNEE ARTHROPLASTY;  Surgeon: Meredith Pel, MD;  Location: [facility];  Service: Orthopedics;  Laterality: Right;   UPPER GASTROINTESTINAL ENDOSCOPY      Social History   Socioeconomic History   Marital status: Widowed    Spouse name: Not on file   Number of children: 3   Years of education: 16   Highest education level: Bachelor's degree (e.g., BA, AB, BS)  Occupational History  Occupation: Retired.   Occupation: retired  Tobacco Use   Smoking status: Former    Packs/day: 1.00    Years: 20.00    Total pack years: 20.00    Types: Cigarettes    Quit date: 11/30/1973    Years since quitting: 49.0   Smokeless tobacco: Never  Vaping Use   Vaping Use: Never used  Substance and Sexual Activity   Alcohol use: Yes    Alcohol/week: 2.0 standard drinks of alcohol    Types: 2 Cans of beer per week    Comment: a beer 3-4 a week   Drug use: Never   Sexual activity: Not Currently  Other Topics Concern   Not on file  Social History Narrative   Lives alone. He has three children. He enjoys traveling.   Social Determinants of Health   Financial Resource Strain: Low Risk  (01/14/2022)   Overall Financial Resource Strain (CARDIA)    Difficulty of Paying Living Expenses: Not hard at all  Food  Insecurity: No Food Insecurity (01/14/2022)   Hunger Vital Sign    Worried About Running Out of Food in the Last Year: Never true    Ran Out of Food in the Last Year: Never true  Transportation Needs: No Transportation Needs (01/14/2022)   PRAPARE - Hydrologist (Medical): No    Lack of Transportation (Non-Medical): No  Physical Activity: Sufficiently Active (01/14/2022)   Exercise Vital Sign    Days of Exercise per Week: 5 days    Minutes of Exercise per Session: 90 min  Stress: No Stress Concern Present (01/14/2022)   [name]    Feeling of Stress : Not at all  Social Connections: Moderately Integrated (01/14/2022)   Social Connection and Isolation Panel [NHANES]    Frequency of Communication with Friends and Family: More than three times a week    Frequency of Social Gatherings with Friends and Family: More than three times a week    Attends Religious Services: More than 4 times per year    Active Member of [employer] or Organizations: Yes    Attends Archivist Meetings: More than 4 times per year    Marital Status: Widowed  Intimate Partner Violence: Not At Risk (01/14/2022)   Humiliation, Afraid, Rape, and Kick questionnaire    Fear of Current or Ex-Partner: No    Emotionally Abused: No    Physically Abused: No    Sexually Abused: No    Family History  Problem Relation Age of Onset   Breast cancer Mother    Cancer Mother    Heart failure Father    Heart disease Father    Healthy Sister    Diabetes Paternal Grandmother    Heart failure Paternal Grandfather    Heart disease Paternal Grandfather    Stomach cancer Neg Hx    Esophageal cancer Neg Hx    Colon cancer Neg Hx    Rectal cancer Neg Hx     ROS: no fevers or chills, productive cough, hemoptysis, dysphasia, odynophagia, melena, hematochezia, dysuria, hematuria, rash, seizure activity, orthopnea, PND, pedal edema,  claudication. Remaining systems are negative.  Physical Exam: Well-developed well-nourished in no acute distress.  Skin is warm and dry.  HEENT is normal.  Neck is supple.  Chest is clear to auscultation with normal expansion.  Cardiovascular exam is regular rate and rhythm.  Abdominal exam nontender or distended. No masses palpated. Extremities show no edema.  neuro grossly intact  ECG-ventricular paced rhythm.  Personally reviewed  A/P  1 coronary artery disease status post coronary bypass graft-patient doing well with no chest pain.  Continue statin.  He is not on aspirin given need for anticoagulation.  2 permanent atrial fibrillation-continue apixaban.  Hemoglobin and renal function monitored by primary care.  3 pacemaker-patient is followed by Dr. Curt Bears.  4 hyperlipidemia-continue statin.  Lipids and liver monitored by primary care.  5 hypertension-patient's blood pressure is controlled.  Continue present medical regimen.  6 history of lower extremity edema-continue diuretic at present dose.  Kirk Ruths, MD

## 2022-12-22 ENCOUNTER — Ambulatory Visit (HOSPITAL_COMMUNITY)
Admission: RE | Admit: 2022-12-22 | Discharge: 2022-12-22 | Disposition: A | Discharge status: Home / Self Care | Payer: Medicare Other | Source: Ambulatory Visit | Attending: Orthopedic Surgery | Admitting: Orthopedic Surgery

## 2022-12-22 DIAGNOSIS — Z950 Presence of cardiac pacemaker: Secondary | ICD-10-CM | POA: Insufficient documentation

## 2022-12-22 DIAGNOSIS — M48061 Spinal stenosis, lumbar region without neurogenic claudication: Secondary | ICD-10-CM | POA: Diagnosis not present

## 2022-12-22 DIAGNOSIS — M545 Low back pain, unspecified: Secondary | ICD-10-CM | POA: Diagnosis not present

## 2022-12-22 DIAGNOSIS — M5126 Other intervertebral disc displacement, lumbar region: Secondary | ICD-10-CM | POA: Diagnosis not present

## 2022-12-24 DIAGNOSIS — K080 Exfoliation of teeth due to systemic causes: Secondary | ICD-10-CM | POA: Diagnosis not present

## 2022-12-28 ENCOUNTER — Ambulatory Visit: Payer: Medicare Other | Admitting: Orthopedic Surgery

## 2022-12-28 DIAGNOSIS — M545 Low back pain, unspecified: Secondary | ICD-10-CM

## 2022-12-29 ENCOUNTER — Encounter: Payer: Self-pay | Admitting: Orthopedic Surgery

## 2022-12-29 NOTE — Progress Notes (Unsigned)
Office Visit Note   Patient: Harry Kline           Date of Birth: [birth year]           MRN: [id number] Visit Date: 12/28/2022 Requested by: Luetta Nutting, [address],  [place] [address] PCP: Luetta Nutting, DO  Subjective: Chief Complaint  Patient presents with   Lower Back - Pain    HPI: Harry Kline is a 89 y.o. male who presents to the office reporting low back pain.  Since he was last seen he had an MRI scan.  Had right total knee and that is doing well.  MRI scan is reviewed and it does show a right-sided L4-5 synovial cyst compressing the L5 nerve root.  Symptoms are worsening.  He can walk a half a mile.  He is on Eliquis..                ROS: All systems reviewed are negative as they relate to the chief complaint within the history of present illness.  Patient denies fevers or chills.  Assessment & Plan: Visit Diagnoses:  1. Low back pain, unspecified back pain laterality, unspecified chronicity, unspecified whether sciatica present     Plan: Impression is right-sided L4-5 synovial cyst giving the patient right radiculopathy.  Right total knee is functioning well.  Refer to Dr. Ernestina Patches for [facility] lumbar spine to evaluate and treat this L4-5 cyst.  Follow-Up Instructions: No follow-ups on file.   Orders:  Orders Placed This Encounter  Procedures   Ambulatory referral to Physical Medicine Rehab   No orders of the defined types were placed in this encounter.     Procedures: No procedures performed   Clinical Data: No additional findings.  Objective: Vital Signs: There were no vitals taken for this visit.  Physical Exam:  Constitutional: Patient appears well-developed HEENT:  Head: Normocephalic Eyes:EOM are normal Neck: Normal range of motion Cardiovascular: Normal rate Pulmonary/chest: Effort normal Neurologic: Patient is alert Skin: Skin is warm Psychiatric: Patient has normal mood and affect  Ortho Exam: Ortho exam  demonstrates excellent range of motion of the right knee.  Mild quad atrophy consistent with his knee replacement.  Ankle dorsiflexion plantarflexion strength intact.  Hip flexion strength 5+ out of 5 bilaterally.  Specialty Comments:  MRI LUMBAR SPINE WITHOUT CONTRAST   TECHNIQUE: Multiplanar, multisequence MR imaging of the lumbar spine was performed. No intravenous contrast was administered.   COMPARISON:  Lumbar spine radiographs 10/21/2022.   FINDINGS: Segmentation: Transitional anatomy with left hemi lumbarization of the S1 segment and fully formed S1-2 disc.   Alignment:  Normal.   Vertebrae: Postoperative changes of L5-S1 posterior spinal and interbody fusion. No suspicious marrow lesions.   Conus medullaris and cauda equina: Conus extends to the L1-2 level. Conus and cauda equina appear normal.   Paraspinal and other soft tissues: Unremarkable.   Disc levels:   T12-L1: Small disc bulge without spinal canal stenosis or neural foraminal narrowing.   L1-L2:  Unremarkable.   L2-L3: Small disc bulge and mild bilateral facet arthropathy. No spinal canal stenosis or neural foraminal narrowing.   L3-L4: Small disc bulge. No significant spinal canal stenosis. Moderate bilateral facet arthropathy contributes to mild bilateral neural foraminal narrowing.   L4-L5: Adjacent segment. Disc bulge and moderate bilateral facet arthropathy with 2.5 mm synovial cyst in the right lateral recess compressing the traversing right L5 nerve root. Mild central spinal canal stenosis and mild right neural foraminal  narrowing.   L5-S1: Prior right hemilaminotomy and interbody fusion. Scarring in the right lateral recess. No spinal canal stenosis. Mild right neural foraminal narrowing.   IMPRESSION: 1. Transitional anatomy with left hemi lumbarization of the S1 segment and fully formed S1-2 disc. 2. L4-L5 adjacent segment disease, with a 2.5 mm synovial cyst in the right lateral recess  compressing the traversing right L5 nerve root. Mild central spinal canal stenosis at this level. 3. Postoperative changes from right hemilaminotomy, interbody and posterior spinal fusion at L5-S1.     Electronically Signed   By: Emmit Alexanders M.D.   On: 12/23/2022 09:16  Imaging: No results found.   PMFS History: Patient Active Problem List   Diagnosis Date Noted   Arthritis of knee 06/23/2022   OA (osteoarthritis) of knee 06/23/2022   History of squamous cell carcinoma 09/09/2020   Pacemaker 02/19/2020   History of lumbar fusion 02/19/2020   Hyperlipidemia 02/19/2020   BPH (benign prostatic hyperplasia) 02/19/2020   Hypertension 02/19/2020   Atrial flutter ([place]) 02/19/2020   Paroxysmal atrial fibrillation ([place]) 02/19/2020   Mitral valve regurgitation 02/19/2020   First degree AV block 02/19/2020   Anxiety 02/19/2020   Obesity (BMI 30.0-34.9) 02/19/2020   Insomnia 02/19/2020   Past Medical History:  Diagnosis Date   Arthritis 2010   Diagnosed but not enough to treat with medication.   Atrial fibrillation (HCC)    Atrial fibrillation (HCC)    CAD (coronary artery disease)    Cancer ([place])    Cataract 2009   surgery both eyes   Chronic kidney disease    GERD (gastroesophageal reflux disease) 2000   Hyperlipidemia    Hypertension    OSA (obstructive sleep apnea)    Pacemaker    Medtronic-   Skin cancer    Sleep apnea 1990   c-pap machine   Status post dilation of esophageal narrowing     Family History  Problem Relation Age of Onset   Breast cancer Mother    Cancer Mother    Heart failure Father    Heart disease Father    Healthy Sister    Diabetes Paternal Grandmother    Heart failure Paternal Grandfather    Heart disease Paternal Grandfather    Stomach cancer Neg Hx    Esophageal cancer Neg Hx    Colon cancer Neg Hx    Rectal cancer Neg Hx     Past Surgical History:  Procedure Laterality Date   COLONOSCOPY     CORONARY ARTERY BYPASS GRAFT      November 2003   [place]   RK surgery   SPINE SURGERY  2014   L4,L5 fusion   TONSILLECTOMY     TOTAL KNEE ARTHROPLASTY Right 06/23/2022   Procedure: RIGHT TOTAL KNEE ARTHROPLASTY;  Surgeon: Meredith Pel, MD;  Location: [facility];  Service: Orthopedics;  Laterality: Right;   UPPER GASTROINTESTINAL ENDOSCOPY     Social History   Occupational History   Occupation: Retired.   Occupation: retired  Tobacco Use   Smoking status: Former    Packs/day: 1.00    Years: 20.00    Total pack years: 20.00    Types: Cigarettes    Quit date: 11/30/1973    Years since quitting: 49.1   Smokeless tobacco: Never  Vaping Use   Vaping Use: Never used  Substance and Sexual Activity   Alcohol use: Yes    Alcohol/week: 2.0 standard drinks of alcohol    Types: 2 Cans  of beer per week    Comment: a beer 3-4 a week   Drug use: Never   Sexual activity: Not Currently

## 2022-12-31 ENCOUNTER — Ambulatory Visit: Payer: Medicare Other | Attending: Cardiology | Admitting: Cardiology

## 2022-12-31 ENCOUNTER — Encounter: Payer: Self-pay | Admitting: Cardiology

## 2022-12-31 VITALS — BP 120/64 | HR 95 | Ht 70.0 in | Wt 223.0 lb

## 2022-12-31 DIAGNOSIS — Z950 Presence of cardiac pacemaker: Secondary | ICD-10-CM | POA: Diagnosis not present

## 2022-12-31 DIAGNOSIS — I4821 Permanent atrial fibrillation: Secondary | ICD-10-CM

## 2022-12-31 DIAGNOSIS — E7800 Pure hypercholesterolemia, unspecified: Secondary | ICD-10-CM

## 2022-12-31 DIAGNOSIS — I2510 Atherosclerotic heart disease of native coronary artery without angina pectoris: Secondary | ICD-10-CM | POA: Diagnosis not present

## 2022-12-31 NOTE — Patient Instructions (Signed)
  Follow-Up: At [facility], you and your health needs are our priority.  As part of our continuing mission to provide you with exceptional heart care, we have created designated Provider Care Teams.  These Care Teams include your primary Cardiologist (physician) and Advanced Practice Providers (APPs -  Physician Assistants and Nurse Practitioners) who all work together to provide you with the care you need, when you need it.  We recommend signing up for the patient portal called "MyChart".  Sign up information is provided on this After Visit Summary.  MyChart is used to connect with patients for Virtual Visits (Telemedicine).  Patients are able to view lab/test results, encounter notes, upcoming appointments, etc.  Non-urgent messages can be sent to your provider as well.   To learn more about what you can do with MyChart, go to [email or web address].    Your next appointment:   9 month(s)  Provider:   Kirk Ruths, MD

## 2023-01-01 NOTE — Progress Notes (Signed)
Remote pacemaker transmission.   

## 2023-01-11 ENCOUNTER — Ambulatory Visit: Payer: Self-pay

## 2023-01-11 ENCOUNTER — Ambulatory Visit: Payer: Medicare Other | Admitting: Physical Medicine and Rehabilitation

## 2023-01-11 VITALS — BP 146/63 | HR 60

## 2023-01-11 DIAGNOSIS — M5416 Radiculopathy, lumbar region: Secondary | ICD-10-CM | POA: Diagnosis not present

## 2023-01-11 MED ORDER — METHYLPREDNISOLONE ACETATE 80 MG/ML IJ SUSP
80.0000 mg | Freq: Once | INTRAMUSCULAR | Status: AC
  Administered 2023-01-11: 80 mg

## 2023-01-11 NOTE — Progress Notes (Signed)
Functional Pain Scale - descriptive words and definitions  Moderate (4)   Constantly aware of pain, can complete ADLs with modification/sleep marginally affected at times/passive distraction is of no use, but active distraction gives some relief. Moderate range order  Average Pain 5   +Driver, -BT, -Dye Allergies.  Pain in inner right leg from groin to ankle

## 2023-01-11 NOTE — Patient Instructions (Signed)

## 2023-01-13 DIAGNOSIS — K080 Exfoliation of teeth due to systemic causes: Secondary | ICD-10-CM | POA: Diagnosis not present

## 2023-01-14 DIAGNOSIS — G4733 Obstructive sleep apnea (adult) (pediatric): Secondary | ICD-10-CM | POA: Diagnosis not present

## 2023-01-18 ENCOUNTER — Ambulatory Visit (INDEPENDENT_AMBULATORY_CARE_PROVIDER_SITE_OTHER): Payer: Medicare Other | Admitting: Family Medicine

## 2023-01-18 ENCOUNTER — Encounter: Payer: Self-pay | Admitting: Family Medicine

## 2023-01-18 DIAGNOSIS — Z0000 Encounter for general adult medical examination without abnormal findings: Secondary | ICD-10-CM | POA: Diagnosis not present

## 2023-01-18 NOTE — Progress Notes (Signed)
MEDICARE ANNUAL WELLNESS VISIT  01/18/2023  Telephone Visit Disclaimer This Medicare AWV was conducted by telephone due to national recommendations for restrictions regarding the COVID-19 Pandemic (e.g. social distancing).  I verified, using two identifiers, that I am speaking with Harry Kline or their authorized healthcare agent. I discussed the limitations, risks, security, and privacy concerns of performing an evaluation and management service by telephone and the potential availability of an in-person appointment in the future. The patient expressed understanding and agreed to proceed.  Location of Patient: Home Location of Provider (nurse):  Provider home  Subjective:    Harry Kline is a 89 y.o. male patient of Luetta Nutting, DO who had a Medicare Annual Wellness Visit today via telephone. Harry Kline is Retired and lives alone. he has 3 children. he reports that he is socially active and does interact with friends/family regularly. he is moderately physically active and enjoys traveling.  Patient Care Team: Luetta Nutting, DO as PCP - General (Family Medicine) Constance Haw, MD as PCP - Electrophysiology (Cardiology) Stanford Breed Denice Bors, MD as PCP - Cardiology (Cardiology) Stanford Breed Denice Bors, MD as Consulting Physician (Cardiology) Orbie Hurst, MD as Referring Physician (Dermatology)     01/18/2023    9:28 AM 06/23/2022    6:16 PM 06/17/2022    8:55 AM 05/12/2022    3:17 PM 01/14/2022    9:32 AM 07/10/2021    3:39 PM 07/10/2021    3:37 PM  Advanced Directives  Does Patient Have a Medical Advance Directive? Yes Yes Yes Yes Yes Yes Yes  Type of Advance Directive Living will Healthcare Power of [place];Living will Living will;Healthcare Power of Attorney Living will;Healthcare Power of [place];Living will;Out of facility DNR (pink MOST or yellow form)   Does patient want to make changes to medical advance directive? No  - Patient declined No - Patient declined   No - Patient declined    Copy of [place] in Chart?  No - copy requested No - copy requested No - copy requested No - copy requested No - copy requested   Would patient like information on creating a medical advance directive?    No - Patient declined       Hospital Utilization Over the Past 12 Months: # of hospitalizations or ER visits: 1 # of surgeries: 1  Review of Systems    Patient reports that his overall health is unchanged compared to last year.  History obtained from chart review and the patient  Patient Reported Readings (BP, Pulse, CBG, Weight, etc) none  Pain Assessment Pain : No/denies pain     Current Medications & Allergies (verified) Allergies as of 01/18/2023   No Known Allergies      Medication List        Accurate as of January 18, 2023  9:38 AM. If you have any questions, ask your nurse or doctor.          apixaban 5 MG Tabs tablet Commonly known as: ELIQUIS Take 1 tablet (5 mg total) by mouth 2 (two) times daily.   atorvastatin 20 MG tablet Commonly known as: LIPITOR Take 1 tablet (20 mg total) by mouth daily.   b complex vitamins capsule Take 1 capsule by mouth daily.   Co Q 10 100 MG Caps Take 100 mg by mouth daily.   docusate sodium 100 MG capsule Commonly known as: COLACE Take 100 mg by mouth 2 (two) times daily.  furosemide 40 MG tablet Commonly known as: LASIX Take 1 tablet (40 mg total) by mouth daily.   Melatonin 10 MG Caps Take 10 mg by mouth at bedtime.   multivitamin tablet Take 1 tablet by mouth daily.   omeprazole 40 MG capsule Commonly known as: PRILOSEC Take 1 capsule (40 mg total) by mouth daily. Once a day for 8 weeks   potassium chloride 10 MEQ tablet Commonly known as: KLOR-CON Take 1 tablet (10 mEq total) by mouth daily.   tamsulosin 0.4 MG Caps capsule Commonly known as: FLOMAX Take 1 capsule (0.4 mg total) by mouth daily.   traZODone  50 MG tablet Commonly known as: DESYREL Take 1-2 tablets (50-100 mg total) by mouth at bedtime as needed for sleep.   vitamin C 1000 MG tablet Take 1,000 mg by mouth 4 (four) times a week.        History (reviewed): Past Medical History:  Diagnosis Date   Arthritis 2010   Diagnosed but not enough to treat with medication.   Atrial fibrillation (HCC)    Atrial fibrillation (HCC)    CAD (coronary artery disease)    Cancer ([place])    Cataract 2009   surgery both eyes   Chronic kidney disease    GERD (gastroesophageal reflux disease) 2000   Hyperlipidemia    Hypertension    OSA (obstructive sleep apnea)    Pacemaker    Medtronic-   Skin cancer    Sleep apnea 1990   c-pap machine   Status post dilation of esophageal narrowing    Past Surgical History:  Procedure Laterality Date   COLONOSCOPY     CORONARY ARTERY BYPASS GRAFT     November 2003   [place]   RK surgery   JOINT REPLACEMENT  June 23 2022   Rt knee replacement   SPINE SURGERY  2014   L4,L5 fusion   TONSILLECTOMY     TOTAL KNEE ARTHROPLASTY Right 06/23/2022   Procedure: RIGHT TOTAL KNEE ARTHROPLASTY;  Surgeon: Meredith Pel, MD;  Location: [facility];  Service: Orthopedics;  Laterality: Right;   UPPER GASTROINTESTINAL ENDOSCOPY     Family History  Problem Relation Age of Onset   Breast cancer Mother    Cancer Mother    Heart failure Father    Heart disease Father    Healthy Sister    Diabetes Paternal Grandmother    Heart failure Paternal Grandfather    Heart disease Paternal Grandfather    Stomach cancer Neg Hx    Esophageal cancer Neg Hx    Colon cancer Neg Hx    Rectal cancer Neg Hx    Social History   Socioeconomic History   Marital status: Widowed    Spouse name: Not on file   Number of children: 3   Years of education: 16   Highest education level: Bachelor's degree (e.g., BA, AB, BS)  Occupational History   Occupation: Retired.   Occupation: retired  Tobacco Use   Smoking  status: Former    Packs/day: 1.00    Years: 20.00    Total pack years: 20.00    Types: Cigarettes    Quit date: 11/30/1973    Years since quitting: 49.1   Smokeless tobacco: Never  Vaping Use   Vaping Use: Never used  Substance and Sexual Activity   Alcohol use: Yes    Alcohol/week: 2.0 standard drinks of alcohol    Types: 2 Cans of beer per week    Comment:  a beer 3-4 a week   Drug use: Never   Sexual activity: Not Currently  Other Topics Concern   Not on file  Social History Narrative   Lives alone. He has three children. He enjoys traveling.   Social Determinants of Health   Financial Resource Strain: Low Risk  (01/14/2023)   Overall Financial Resource Strain (CARDIA)    Difficulty of Paying Living Expenses: Not hard at all  Food Insecurity: No Food Insecurity (01/14/2023)   Hunger Vital Sign    Worried About Running Out of Food in the Last Year: Never true    Ran Out of Food in the Last Year: Never true  Transportation Needs: No Transportation Needs (01/14/2023)   PRAPARE - Hydrologist (Medical): No    Lack of Transportation (Non-Medical): No  Physical Activity: Insufficiently Active (01/14/2023)   Exercise Vital Sign    Days of Exercise per Week: 2 days    Minutes of Exercise per Session: 30 min  Stress: No Stress Concern Present (01/14/2023)   [place]    Feeling of Stress : Not at all  Social Connections: Moderately Integrated (01/18/2023)   Social Connection and Isolation Panel [NHANES]    Frequency of Communication with Friends and Family: More than three times a week    Frequency of Social Gatherings with Friends and Family: More than three times a week    Attends Religious Services: More than 4 times per year    Active Member of [employer] or Organizations: Yes    Attends Archivist Meetings: More than 4 times per year    Marital Status: Widowed    Activities of  Daily Living    01/14/2023   12:43 PM 06/23/2022    6:16 PM  In your present state of health, do you have any difficulty performing the following activities:  Hearing? 0 0  Vision? 0 0  Difficulty concentrating or making decisions? 0 0  Walking or climbing stairs? 0 0  Dressing or bathing? 0 0  Doing errands, shopping? 0 0  Preparing Food and eating ? N   Using the Toilet? N   In the past six months, have you accidently leaked urine? N   Do you have problems with loss of bowel control? N   Managing your Medications? N   Managing your Finances? N   Housekeeping or managing your Housekeeping? N     Patient Education/ Literacy How often do you need to have someone help you when you read instructions, pamphlets, or other written materials from your doctor or pharmacy?: 1 - Never What is the last grade level you completed in school?: Bachelor's degree  Exercise Current Exercise Habits: Home exercise routine, Type of exercise: walking, Time (Minutes): 30, Frequency (Times/Week): 2, Weekly Exercise (Minutes/Week): 60, Intensity: Moderate, Exercise limited by: orthopedic condition(s)  Diet Patient reports consuming  2-3  meals a day and 1 snack(s) a day Patient reports that his primary diet is: Regular Patient reports that she does have regular access to food.   Depression Screen    01/18/2023    9:33 AM 01/18/2023    9:31 AM 07/20/2022    9:42 AM 01/14/2022    9:34 AM 07/10/2021    3:33 PM  PHQ 2/9 Scores  PHQ - 2 Score 0 0 0 0 0  PHQ- 9 Score    0 1     Fall Risk    01/18/2023  9:24 AM 01/14/2023   12:43 PM 07/20/2022    9:42 AM 01/14/2022    9:35 AM 01/10/2022   12:01 PM  Fall Risk   Falls in the past year? 0 0 0 0 0  Number falls in past yr: 0 0 0 0 0  Injury with Fall? 0 0 0 0 0  Risk for fall due to : No Fall Risks  No Fall Risks No Fall Risks   Follow up Falls evaluation completed  Falls evaluation completed Falls evaluation completed      Objective:  Harry Kline  seemed alert and oriented and he participated appropriately during our telephone visit.  Blood Pressure Weight BMI  BP Readings from Last 3 Encounters:  01/11/23 (!) 146/63  12/31/22 120/64  10/19/22 (!) 109/51   Wt Readings from Last 3 Encounters:  12/31/22 223 lb (101.2 kg)  10/19/22 231 lb (104.8 kg)  09/03/22 231 lb (104.8 kg)   BMI Readings from Last 1 Encounters:  12/31/22 32.00 kg/m    *Unable to obtain current vital signs, weight, and BMI due to telephone visit type  Hearing/Vision  Harry Kline did not seem to have difficulty with hearing/understanding during the telephone conversation Reports that he has had a formal eye exam by an eye care professional within the past year Reports that he has not had a formal hearing evaluation within the past year *Unable to fully assess hearing and vision during telephone visit type  Cognitive Function:    01/18/2023    9:31 AM 01/14/2022    9:33 AM  6CIT Screen  What Year? 0 points 0 points  What month? 0 points 0 points  What time? 0 points 0 points  Count back from 20 0 points 0 points  Months in reverse 0 points 0 points  Repeat phrase 0 points 0 points  Total Score 0 points 0 points   (Normal:0-7, Significant for Dysfunction: >8)  Normal Cognitive Function Screening: Yes   Immunization & Health Maintenance Record Immunization History  Administered Date(s) Administered   DTaP 02/07/2020   Fluad Quad(high Dose 65+) 09/12/2021, 09/03/2022   Influenza-Unspecified 08/15/2019, 10/16/2020   Moderna Sars-Covid-2 Vaccination 12/22/2019, 01/21/2020, 12/02/2020   PNEUMOCOCCAL CONJUGATE-20 01/20/2022   Pfizer Covid-19 Vaccine Bivalent Booster 5y-11y 10/12/2021   Tdap 02/07/2020   Zoster, Live 08/18/2014    Health Maintenance  Topic Date Due   COVID-19 Vaccine (5 - 2023-24 season) 02/03/2023 (Originally 07/31/2022)   Zoster Vaccines- Shingrix (1 of 2) 04/18/2023 (Originally 11/20/1986)   Medicare Annual Wellness (AWV)  01/19/2024    DTaP/Tdap/Td (3 - Td or Tdap) 02/06/2030   Pneumonia Vaccine 74+ Years old  Completed   INFLUENZA VACCINE  Completed   HPV VACCINES  Aged Out       Assessment  This is a routine wellness examination for Harry Kline.  Health Maintenance: Due or Overdue There are no preventive care reminders to display for this patient.   Harry Kline does not need a referral for Community Assistance: Care Management:   no Social Work:    no Prescription Assistance:  no Nutrition/Diabetes Education:  no   Plan:  Personalized Goals  Goals Addressed               This Visit's Progress     Patient Stated (pt-stated)        Patient stated that he would like to be able to walk without being in any pain.       Personalized Health Maintenance & Screening Recommendations  Shingles vaccine  Lung Cancer Screening Recommended: no (Low Dose CT Chest recommended if Age 82-80 years, 30 pack-year currently smoking OR have quit w/in past 15 years) Hepatitis C Screening recommended: no HIV Screening recommended: no  Advanced Directives: Written information was not prepared per patient's request.  Referrals & Orders No orders of the defined types were placed in this encounter.   Follow-up Plan Follow-up with Luetta Nutting, DO as planned Schedule shingles vaccine at the pharmacy. Medicare wellness visit in one year.  Patient will access AVS on my chart.   I have personally reviewed and noted the following in the patient's chart:   Medical and social history Use of alcohol, tobacco or illicit drugs  Current medications and supplements Functional ability and status Nutritional status Physical activity Advanced directives List of other physicians Hospitalizations, surgeries, and ER visits in previous 12 months Vitals Screenings to include cognitive, depression, and falls Referrals and appointments  In addition, I have reviewed and discussed with Harry Kline certain preventive  protocols, quality metrics, and best practice recommendations. A written personalized care plan for preventive services as well as general preventive health recommendations is available and can be mailed to the patient at his request.      Harry Gens, RN BSN  01/18/2023

## 2023-01-18 NOTE — Patient Instructions (Signed)
[place] Maintenance Summary and Written Plan of Care  Mr. Harry Kline ,  Thank you for allowing me to perform your Medicare Annual Wellness Visit and for your ongoing commitment to your health.   Health Maintenance & Immunization History Health Maintenance  Topic Date Due   COVID-19 Vaccine (5 - 2023-24 season) 02/03/2023 (Originally 07/31/2022)   Zoster Vaccines- Shingrix (1 of 2) 04/18/2023 (Originally 11/20/1986)   Medicare Annual Wellness (AWV)  01/19/2024   DTaP/Tdap/Td (3 - Td or Tdap) 02/06/2030   Pneumonia Vaccine 2+ Years old  Completed   INFLUENZA VACCINE  Completed   HPV VACCINES  Aged Out   Immunization History  Administered Date(s) Administered   DTaP 02/07/2020   Fluad Quad(high Dose 65+) 09/12/2021, 09/03/2022   Influenza-Unspecified 08/15/2019, 10/16/2020   Moderna Sars-Covid-2 Vaccination 12/22/2019, 01/21/2020, 12/02/2020   PNEUMOCOCCAL CONJUGATE-20 01/20/2022   Pfizer Covid-19 Vaccine Bivalent Booster 5y-11y 10/12/2021   Tdap 02/07/2020   Zoster, Live 08/18/2014    These are the patient goals that we discussed:  Goals Addressed               This Visit's Progress     Patient Stated (pt-stated)        Patient stated that he would like to be able to walk without being in any pain.         This is a list of Health Maintenance Items that are overdue or due now: Shingles vaccine    Orders/Referrals Placed Today: No orders of the defined types were placed in this encounter.  (Contact our referral department at [phone] if you have not spoken with someone about your referral appointment within the next 5 days)    Follow-up Plan Follow-up with Luetta Nutting, DO as planned Schedule shingles vaccine at the pharmacy. Medicare wellness visit in one year.  Patient will access AVS on my chart.      Health Maintenance, Male Adopting a healthy lifestyle and getting preventive care are important in promoting health and  wellness. Ask your health care provider about: The right schedule for you to have regular tests and exams. Things you can do on your own to prevent diseases and keep yourself healthy. What should I know about diet, weight, and exercise? Eat a healthy diet  Eat a diet that includes plenty of vegetables, fruits, low-fat dairy products, and lean protein. Do not eat a lot of foods that are high in solid fats, added sugars, or sodium. Maintain a healthy weight Body mass index (BMI) is a measurement that can be used to identify possible weight problems. It estimates body fat based on height and weight. Your health care provider can help determine your BMI and help you achieve or maintain a healthy weight. Get regular exercise Get regular exercise. This is one of the most important things you can do for your health. Most adults should: Exercise for at least 150 minutes each week. The exercise should increase your heart rate and make you sweat (moderate-intensity exercise). Do strengthening exercises at least twice a week. This is in addition to the moderate-intensity exercise. Spend less time sitting. Even light physical activity can be beneficial. Watch cholesterol and blood lipids Have your blood tested for lipids and cholesterol at 87 years of age, then have this test every 5 years. You may need to have your cholesterol levels checked more often if: Your lipid or cholesterol levels are high. You are older than 87 years of age. You are at high risk  for heart disease. What should I know about cancer screening? Many types of cancers can be detected early and may often be prevented. Depending on your health history and family history, you may need to have cancer screening at various ages. This may include screening for: Colorectal cancer. Prostate cancer. Skin cancer. Lung cancer. What should I know about heart disease, diabetes, and high blood pressure? Blood pressure and heart disease High  blood pressure causes heart disease and increases the risk of stroke. This is more likely to develop in people who have high blood pressure readings or are overweight. Talk with your health care provider about your target blood pressure readings. Have your blood pressure checked: Every 3-5 years if you are 30-75 years of age. Every year if you are 61 years old or older. If you are between the ages of 47 and 66 and are a current or former smoker, ask your health care provider if you should have a one-time screening for abdominal aortic aneurysm (AAA). Diabetes Have regular diabetes screenings. This checks your fasting blood sugar level. Have the screening done: Once every three years after age 30 if you are at a normal weight and have a low risk for diabetes. More often and at a younger age if you are overweight or have a high risk for diabetes. What should I know about preventing infection? Hepatitis B If you have a higher risk for hepatitis B, you should be screened for this virus. Talk with your health care provider to find out if you are at risk for hepatitis B infection. Hepatitis C Blood testing is recommended for: Everyone born from [birth year] through 1963. Anyone with known risk factors for hepatitis C. Sexually transmitted infections (STIs) You should be screened each year for STIs, including gonorrhea and chlamydia, if: You are sexually active and are younger than 87 years of age. You are older than 87 years of age and your health care provider tells you that you are at risk for this type of infection. Your sexual activity has changed since you were last screened, and you are at increased risk for chlamydia or gonorrhea. Ask your health care provider if you are at risk. Ask your health care provider about whether you are at high risk for HIV. Your health care provider may recommend a prescription medicine to help prevent HIV infection. If you choose to take medicine to prevent HIV, you  should first get tested for HIV. You should then be tested every 3 months for as long as you are taking the medicine. Follow these instructions at home: Alcohol use Do not drink alcohol if your health care provider tells you not to drink. If you drink alcohol: Limit how much you have to 0-2 drinks a day. Know how much alcohol is in your drink. In the [place] one drink equals one 12 oz bottle of beer (355 mL), one 5 oz glass of wine (148 mL), or one 1 oz glass of hard liquor (44 mL). Lifestyle Do not use any products that contain nicotine or tobacco. These products include cigarettes, chewing tobacco, and vaping devices, such as e-cigarettes. If you need help quitting, ask your health care provider. Do not use street drugs. Do not share needles. Ask your health care provider for help if you need support or information about quitting drugs. General instructions Schedule regular health, dental, and eye exams. Stay current with your vaccines. Tell your health care provider if: You often feel depressed. You have ever been abused or  do not feel safe at home. Summary Adopting a healthy lifestyle and getting preventive care are important in promoting health and wellness. Follow your health care provider's instructions about healthy diet, exercising, and getting tested or screened for diseases. Follow your health care provider's instructions on monitoring your cholesterol and blood pressure. This information is not intended to replace advice given to you by your health care provider. Make sure you discuss any questions you have with your health care provider. Document Revised: 04/07/2021 Document Reviewed: 04/07/2021 Elsevier Patient Education  [place].

## 2023-01-20 ENCOUNTER — Encounter: Payer: Self-pay | Admitting: Family Medicine

## 2023-01-20 ENCOUNTER — Ambulatory Visit (INDEPENDENT_AMBULATORY_CARE_PROVIDER_SITE_OTHER): Payer: Medicare Other | Admitting: Family Medicine

## 2023-01-20 VITALS — BP 132/75 | HR 77 | Ht 70.0 in | Wt 218.1 lb

## 2023-01-20 DIAGNOSIS — E7800 Pure hypercholesterolemia, unspecified: Secondary | ICD-10-CM

## 2023-01-20 DIAGNOSIS — I440 Atrioventricular block, first degree: Secondary | ICD-10-CM | POA: Diagnosis not present

## 2023-01-20 DIAGNOSIS — Z950 Presence of cardiac pacemaker: Secondary | ICD-10-CM

## 2023-01-20 DIAGNOSIS — N400 Enlarged prostate without lower urinary tract symptoms: Secondary | ICD-10-CM | POA: Diagnosis not present

## 2023-01-20 DIAGNOSIS — I10 Essential (primary) hypertension: Secondary | ICD-10-CM | POA: Diagnosis not present

## 2023-01-20 DIAGNOSIS — I480 Paroxysmal atrial fibrillation: Secondary | ICD-10-CM

## 2023-01-20 DIAGNOSIS — Z0000 Encounter for general adult medical examination without abnormal findings: Secondary | ICD-10-CM

## 2023-01-20 HISTORY — DX: Encounter for general adult medical examination without abnormal findings: Z00.00

## 2023-01-20 NOTE — Patient Instructions (Signed)
Preventive Care 65 Years and Older, Male Preventive care refers to lifestyle choices and visits with your health care provider that can promote health and wellness. Preventive care visits are also called wellness exams. What can I expect for my preventive care visit? Counseling During your preventive care visit, your health care provider may ask about your: Medical history, including: Past medical problems. Family medical history. History of falls. Current health, including: Emotional well-being. Home life and relationship well-being. Sexual activity. Memory and ability to understand (cognition). Lifestyle, including: Alcohol, nicotine or tobacco, and drug use. Access to firearms. Diet, exercise, and sleep habits. Work and work environment. Sunscreen use. Safety issues such as seatbelt and bike helmet use. Physical exam Your health care provider will check your: Height and weight. These may be used to calculate your BMI (body mass index). BMI is a measurement that tells if you are at a healthy weight. Waist circumference. This measures the distance around your waistline. This measurement also tells if you are at a healthy weight and may help predict your risk of certain diseases, such as type 2 diabetes and high blood pressure. Heart rate and blood pressure. Body temperature. Skin for abnormal spots. What immunizations do I need?  Vaccines are usually given at various ages, according to a schedule. Your health care provider will recommend vaccines for you based on your age, medical history, and lifestyle or other factors, such as travel or where you work. What tests do I need? Screening Your health care provider may recommend screening tests for certain conditions. This may include: Lipid and cholesterol levels. Diabetes screening. This is done by checking your blood sugar (glucose) after you have not eaten for a while (fasting). Hepatitis C test. Hepatitis B test. HIV (human  immunodeficiency virus) test. STI (sexually transmitted infection) testing, if you are at risk. Lung cancer screening. Colorectal cancer screening. Prostate cancer screening. Abdominal aortic aneurysm (AAA) screening. You may need this if you are a current or former smoker. Talk with your health care provider about your test results, treatment options, and if necessary, the need for more tests. Follow these instructions at home: Eating and drinking  Eat a diet that includes fresh fruits and vegetables, whole grains, lean protein, and low-fat dairy products. Limit your intake of foods with high amounts of sugar, saturated fats, and salt. Take vitamin and mineral supplements as recommended by your health care provider. Do not drink alcohol if your health care provider tells you not to drink. If you drink alcohol: Limit how much you have to 0-2 drinks a day. Know how much alcohol is in your drink. In the [place] one drink equals one 12 oz bottle of beer (355 mL), one 5 oz glass of wine (148 mL), or one 1 oz glass of hard liquor (44 mL). Lifestyle Brush your teeth every morning and night with fluoride toothpaste. Floss one time each day. Exercise for at least 30 minutes 5 or more days each week. Do not use any products that contain nicotine or tobacco. These products include cigarettes, chewing tobacco, and vaping devices, such as e-cigarettes. If you need help quitting, ask your health care provider. Do not use drugs. If you are sexually active, practice safe sex. Use a condom or other form of protection to prevent STIs. Take aspirin only as told by your health care provider. Make sure that you understand how much to take and what form to take. Work with your health care provider to find out whether it is safe   and beneficial for you to take aspirin daily. Ask your health care provider if you need to take a cholesterol-lowering medicine (statin). Find healthy ways to manage stress, such  as: Meditation, yoga, or listening to music. Journaling. Talking to a trusted person. Spending time with friends and family. Safety Always wear your seat belt while driving or riding in a vehicle. Do not drive: If you have been drinking alcohol. Do not ride with someone who has been drinking. When you are tired or distracted. While texting. If you have been using any mind-altering substances or drugs. Wear a helmet and other protective equipment during sports activities. If you have firearms in your house, make sure you follow all gun safety procedures. Minimize exposure to UV radiation to reduce your risk of skin cancer. What's next? Visit your health care provider once a year for an annual wellness visit. Ask your health care provider how often you should have your eyes and teeth checked. Stay up to date on all vaccines. This information is not intended to replace advice given to you by your health care provider. Make sure you discuss any questions you have with your health care provider. Document Revised: 05/14/2021 Document Reviewed: 05/14/2021 Elsevier Patient Education  2023 Elsevier Inc.  

## 2023-01-20 NOTE — Progress Notes (Addendum)
Harry Kline - 89 y.o. male MRN [id number]  Date of birth: 01-28-1934  Subjective Chief Complaint  Patient presents with   Annual Exam    HPI Harry Kline is a 89 y.o. male here today for annual exam.   He reports that he is doing well.    Tolerating chronic medications well at current strength.  Denies new chest pain, exertional dyspnea or worsening fatigue.   He is moderately active.  He feels like his diet is pretty good.   Remote history of smoking.  Has 2 beers 3 days per week.   Review of Systems  Constitutional:  Negative for chills, fever, malaise/fatigue and weight loss.  HENT:  Negative for congestion, ear pain and sore throat.   Eyes:  Negative for blurred vision, double vision and pain.  Respiratory:  Negative for cough and shortness of breath.   Cardiovascular:  Negative for chest pain and palpitations.  Gastrointestinal:  Negative for abdominal pain, blood in stool, constipation, heartburn and nausea.  Genitourinary:  Negative for dysuria and urgency.  Musculoskeletal:  Negative for joint pain and myalgias.  Neurological:  Negative for dizziness and headaches.  Endo/Heme/Allergies:  Does not bruise/bleed easily.  Psychiatric/Behavioral:  Negative for depression. The patient is not nervous/anxious and does not have insomnia.     No Known Allergies  Past Medical History:  Diagnosis Date   Arthritis 2010   Diagnosed but not enough to treat with medication.   Atrial fibrillation (HCC)    Atrial fibrillation (HCC)    CAD (coronary artery disease)    Cancer ([place])    Cataract 2009   surgery both eyes   Chronic kidney disease    GERD (gastroesophageal reflux disease) 2000   Hyperlipidemia    Hypertension    OSA (obstructive sleep apnea)    Pacemaker    [employer]-   Skin cancer    Sleep apnea 1990   c-pap machine   Status post dilation of esophageal narrowing    Well adult exam 01/20/2023    Past Surgical History:  Procedure Laterality Date    COLONOSCOPY     CORONARY ARTERY BYPASS GRAFT     November 2003   [facility]   RK surgery   JOINT REPLACEMENT  June 23 2022   Rt knee replacement   SPINE SURGERY  2014   L4,L5 fusion   TONSILLECTOMY     TOTAL KNEE ARTHROPLASTY Right 06/23/2022   Procedure: RIGHT TOTAL KNEE ARTHROPLASTY;  Surgeon: Meredith Pel, MD;  Location: [facility];  Service: Orthopedics;  Laterality: Right;   UPPER GASTROINTESTINAL ENDOSCOPY      Social History   Socioeconomic History   Marital status: Widowed    Spouse name: Not on file   Number of children: 3   Years of education: 16   Highest education level: Bachelor's degree (e.g., BA, AB, BS)  Occupational History   Occupation: Retired.   Occupation: retired  Tobacco Use   Smoking status: Former    Packs/day: 1.00    Years: 20.00    Total pack years: 20.00    Types: Cigarettes    Quit date: 11/30/1973    Years since quitting: 49.1   Smokeless tobacco: Never  Vaping Use   Vaping Use: Never used  Substance and Sexual Activity   Alcohol use: Yes    Alcohol/week: 2.0 standard drinks of alcohol    Types: 2 Cans of beer per week    Comment: a beer 3-4 a week  Drug use: Never   Sexual activity: Not Currently  Other Topics Concern   Not on file  Social History Narrative   Lives alone. He has three children. He enjoys traveling.   Social Determinants of Health   Financial Resource Strain: Low Risk  (01/14/2023)   Overall Financial Resource Strain (CARDIA)    Difficulty of Paying Living Expenses: Not hard at all  Food Insecurity: No Food Insecurity (01/14/2023)   Hunger Vital Sign    Worried About Running Out of Food in the Last Year: Never true    Ran Out of Food in the Last Year: Never true  Transportation Needs: No Transportation Needs (01/14/2023)   PRAPARE - Hydrologist (Medical): No    Lack of Transportation (Non-Medical): No  Physical Activity: Insufficiently Active (01/14/2023)   Exercise Vital  Sign    Days of Exercise per Week: 2 days    Minutes of Exercise per Session: 30 min  Stress: No Stress Concern Present (01/14/2023)   [place]    Feeling of Stress : Not at all  Social Connections: Moderately Integrated (01/18/2023)   Social Connection and Isolation Panel [NHANES]    Frequency of Communication with Friends and Family: More than three times a week    Frequency of Social Gatherings with Friends and Family: More than three times a week    Attends Religious Services: More than 4 times per year    Active Member of [employer] or Organizations: Yes    Attends Archivist Meetings: More than 4 times per year    Marital Status: Widowed    Family History  Problem Relation Age of Onset   Breast cancer Mother    Cancer Mother    Heart failure Father    Heart disease Father    Healthy Sister    Diabetes Paternal Grandmother    Heart failure Paternal Grandfather    Heart disease Paternal Grandfather    Stomach cancer Neg Hx    Esophageal cancer Neg Hx    Colon cancer Neg Hx    Rectal cancer Neg Hx     Health Maintenance  Topic Date Due   COVID-19 Vaccine (5 - 2023-24 season) 02/03/2023 (Originally 07/31/2022)   Zoster Vaccines- Shingrix (1 of 2) 04/18/2023 (Originally 11/20/1986)   Medicare Annual Wellness (AWV)  01/19/2024   DTaP/Tdap/Td (3 - Td or Tdap) 02/06/2030   Pneumonia Vaccine 53+ Years old  Completed   INFLUENZA VACCINE  Completed   HPV VACCINES  Aged Out     ----------------------------------------------------------------------------------------------------------------------------------------------------------------------------------------------------------------- Physical Exam BP 132/75 (BP Location: Right Arm, Patient Position: Sitting, Cuff Size: Normal)   Pulse 77   Ht 5' 10"$  (1.778 m)   Wt 218 lb 1.9 oz (98.9 kg)   SpO2 100%   BMI 31.30 kg/m   Physical Exam Constitutional:       General: He is not in acute distress. HENT:     Head: Normocephalic and atraumatic.     Right Ear: Tympanic membrane and external ear normal.     Left Ear: Tympanic membrane and external ear normal.  Eyes:     General: No scleral icterus. Neck:     Thyroid: No thyromegaly.  Cardiovascular:     Rate and Rhythm: Normal rate and regular rhythm.     Heart sounds: Normal heart sounds.  Pulmonary:     Effort: Pulmonary effort is normal.     Breath sounds: Normal breath  sounds.  Abdominal:     General: Bowel sounds are normal. There is no distension.     Palpations: Abdomen is soft.     Tenderness: There is no abdominal tenderness. There is no guarding.  Musculoskeletal:     Cervical back: Normal range of motion.  Lymphadenopathy:     Cervical: No cervical adenopathy.  Skin:    General: Skin is warm and dry.     Findings: No rash.  Neurological:     Mental Status: He is alert and oriented to person, place, and time.     Cranial Nerves: No cranial nerve deficit.     Motor: No abnormal muscle tone.  Psychiatric:        Mood and Affect: Mood normal.        Behavior: Behavior normal.     ------------------------------------------------------------------------------------------------------------------------------------------------------------------------------------------------------------------- Assessment and Plan  Well adult exam Well adult Orders Placed This Encounter  Procedures   COMPLETE METABOLIC PANEL WITH GFR   CBC with Differential   Lipid Panel w/reflex Direct LDL   PSA  Screenings: Per lab orders.  Risks/Benefits of PSA screening discussed.  He would like to continue to have this monitored.  Immunizations:  Recommend RSV vaccine through his pharmacy.  Anticipatory guidance/Risk factor reduction:  Recommendations per AVS.   Paroxysmal atrial fibrillation (HCC) Stable at this time.  Continue management per cardiology.  No side effects with current  anticoagulation.   First degree AV block Has pacemaker.  No new symptoms.    No orders of the defined types were placed in this encounter.   Return in about 6 months (around 07/21/2023) for HTN.    This visit occurred during the SARS-CoV-2 public health emergency.  Safety protocols were in place, including screening questions prior to the visit, additional usage of staff PPE, and extensive cleaning of exam room while observing appropriate contact time as indicated for disinfecting solutions.

## 2023-01-20 NOTE — Assessment & Plan Note (Signed)
Stable at this time.  Continue management per cardiology.  No side effects with current anticoagulation.

## 2023-01-20 NOTE — Assessment & Plan Note (Signed)
Well adult Orders Placed This Encounter  Procedures   COMPLETE METABOLIC PANEL WITH GFR   CBC with Differential   Lipid Panel w/reflex Direct LDL   PSA  Screenings: Per lab orders.  Risks/Benefits of PSA screening discussed.  He would like to continue to have this monitored.  Immunizations:  Recommend RSV vaccine through his pharmacy.  Anticipatory guidance/Risk factor reduction:  Recommendations per AVS.

## 2023-01-20 NOTE — Assessment & Plan Note (Addendum)
Has pacemaker.  No new symptoms.

## 2023-01-21 LAB — CBC WITH DIFFERENTIAL/PLATELET
Absolute Monocytes: 699 cells/uL (ref 200–950)
Basophils Absolute: 38 cells/uL (ref 0–200)
Basophils Relative: 0.5 %
Eosinophils Absolute: 137 cells/uL (ref 15–500)
Eosinophils Relative: 1.8 %
HCT: 48.4 % (ref 38.5–50.0)
Hemoglobin: 16.6 g/dL (ref 13.2–17.1)
Lymphs Abs: 2432 cells/uL (ref 850–3900)
MCH: 34.3 pg — ABNORMAL HIGH (ref 27.0–33.0)
MCHC: 34.3 g/dL (ref 32.0–36.0)
MCV: 100 fL (ref 80.0–100.0)
MPV: 9.5 fL (ref 7.5–12.5)
Monocytes Relative: 9.2 %
Neutro Abs: 4294 cells/uL (ref 1500–7800)
Neutrophils Relative %: 56.5 %
Platelets: 193 10*3/uL (ref 140–400)
RBC: 4.84 10*6/uL (ref 4.20–5.80)
RDW: 12.9 % (ref 11.0–15.0)
Total Lymphocyte: 32 %
WBC: 7.6 10*3/uL (ref 3.8–10.8)

## 2023-01-21 LAB — LIPID PANEL W/REFLEX DIRECT LDL
Cholesterol: 141 mg/dL (ref <200)
HDL: 60 mg/dL (ref >=40)
LDL Cholesterol (Calc): 65 mg/dL (calc)
Non-HDL Cholesterol (Calc): 81 mg/dL (calc) (ref <130)
Total CHOL/HDL Ratio: 2.4 (calc) (ref <5.0)
Triglycerides: 78 mg/dL (ref <150)

## 2023-01-21 LAB — COMPLETE METABOLIC PANEL WITH GFR
AG Ratio: 1.7 (calc) (ref 1.0–2.5)
ALT: 24 U/L (ref 9–46)
AST: 23 U/L (ref 10–35)
Albumin: 4.8 g/dL (ref 3.6–5.1)
Alkaline phosphatase (APISO): 89 U/L (ref 35–144)
BUN/Creatinine Ratio: 18 (calc) (ref 6–22)
BUN: 22 mg/dL (ref 7–25)
CO2: 29 mmol/L (ref 20–32)
Calcium: 9.8 mg/dL (ref 8.6–10.3)
Chloride: 103 mmol/L (ref 98–110)
Creat: 1.24 mg/dL — ABNORMAL HIGH (ref 0.70–1.22)
Globulin: 2.9 g/dL (calc) (ref 1.9–3.7)
Glucose, Bld: 91 mg/dL (ref 65–99)
Potassium: 4.1 mmol/L (ref 3.5–5.3)
Sodium: 143 mmol/L (ref 135–146)
Total Bilirubin: 2.1 mg/dL — ABNORMAL HIGH (ref 0.2–1.2)
Total Protein: 7.7 g/dL (ref 6.1–8.1)
eGFR: 57 mL/min/{1.73_m2} — ABNORMAL LOW (ref >=60)

## 2023-01-21 LAB — PSA: PSA: 0.58 ng/mL (ref <=4.00)

## 2023-01-25 NOTE — Procedures (Signed)
Lumbosacral Transforaminal Epidural Steroid Injection - Sub-Pedicular Approach with Fluoroscopic Guidance  Patient: Harry Kline      Date of Birth: 1934-08-01 MRN: [id number] PCP: Luetta Nutting, DO      Visit Date: 01/11/2023   Universal Protocol:    Date/Time: 01/11/2023  Consent Given By: the patient  Position: PRONE  Additional Comments: Vital signs were monitored before and after the procedure. Patient was prepped and draped in the usual sterile fashion. The correct patient, procedure, and site was verified.   Injection Procedure Details:   Procedure diagnoses: Lumbar radiculopathy [M54.16]    Meds Administered:  Meds ordered this encounter  Medications   methylPREDNISolone acetate (DEPO-MEDROL) injection 80 mg    Laterality: Right  Location/Site: L4 and L5  Needle:5.0 in., 22 ga.  Short bevel or Quincke spinal needle  Needle Placement: Transforaminal  Findings:    -Comments: Excellent flow of contrast along the nerve, nerve root and into the epidural space.  Procedure Details: After squaring off the end-plates to get a true AP view, the C-arm was positioned so that an oblique view of the foramen as noted above was visualized. The target area is just inferior to the "nose of the scotty dog" or sub pedicular. The soft tissues overlying this structure were infiltrated with 2-3 ml. of 1% Lidocaine without Epinephrine.  The spinal needle was inserted toward the target using a "trajectory" view along the fluoroscope beam.  Under AP and lateral visualization, the needle was advanced so it did not puncture dura and was located close the 6 O'Clock position of the pedical in AP tracterory. Biplanar projections were used to confirm position. Aspiration was confirmed to be negative for CSF and/or blood. A 1-2 ml. volume of Isovue-250 was injected and flow of contrast was noted at each level. Radiographs were obtained for documentation purposes.   After attaining the desired  flow of contrast documented above, a 0.5 to 1.0 ml test dose of 0.25% Marcaine was injected into each respective transforaminal space.  The patient was observed for 90 seconds post injection.  After no sensory deficits were reported, and normal lower extremity motor function was noted,   the above injectate was administered so that equal amounts of the injectate were placed at each foramen (level) into the transforaminal epidural space.   Additional Comments:  No complications occurred Dressing: 2 x 2 sterile gauze and Band-Aid    Post-procedure details: Patient was observed during the procedure. Post-procedure instructions were reviewed.  Patient left the clinic in stable condition.

## 2023-01-25 NOTE — Progress Notes (Signed)
Harry Kline - 89 y.o. male MRN [id number]  Date of birth: 02/18/1934  Office Visit Note: Visit Date: 01/11/2023 PCP: Luetta Nutting, DO Referred by: Luetta Nutting, DO  Subjective: Chief Complaint  Patient presents with   Lower Back - Pain   HPI:  Harry Kline is a 89 y.o. male who comes in today at the request of Dr. Anderson Malta for planned Right L4-5 and L5-S1 Lumbar Transforaminal epidural steroid injection with fluoroscopic guidance.  The patient has failed conservative care including home exercise, medications, time and activity modification.  This injection will be diagnostic and hopefully therapeutic.  Please see requesting physician notes for further details and justification.   ROS Otherwise per HPI.  Assessment & Plan: Visit Diagnoses:    ICD-10-CM   1. Lumbar radiculopathy  M54.16 XR C-ARM NO REPORT    Epidural Steroid injection    methylPREDNISolone acetate (DEPO-MEDROL) injection 80 mg      Plan: No additional findings.   Meds & Orders:  Meds ordered this encounter  Medications   methylPREDNISolone acetate (DEPO-MEDROL) injection 80 mg    Orders Placed This Encounter  Procedures   XR C-ARM NO REPORT   Epidural Steroid injection    Follow-up: Return for visit to requesting provider as needed.   Procedures: No procedures performed  Lumbosacral Transforaminal Epidural Steroid Injection - Sub-Pedicular Approach with Fluoroscopic Guidance  Patient: Harry Kline      Date of Birth: 1934/04/20 MRN: [id number] PCP: Luetta Nutting, DO      Visit Date: 01/11/2023   Universal Protocol:    Date/Time: 01/11/2023  Consent Given By: the patient  Position: PRONE  Additional Comments: Vital signs were monitored before and after the procedure. Patient was prepped and draped in the usual sterile fashion. The correct patient, procedure, and site was verified.   Injection Procedure Details:   Procedure diagnoses: Lumbar radiculopathy [M54.16]    Meds  Administered:  Meds ordered this encounter  Medications   methylPREDNISolone acetate (DEPO-MEDROL) injection 80 mg    Laterality: Right  Location/Site: L4 and L5  Needle:5.0 in., 22 ga.  Short bevel or Quincke spinal needle  Needle Placement: Transforaminal  Findings:    -Comments: Excellent flow of contrast along the nerve, nerve root and into the epidural space.  Procedure Details: After squaring off the end-plates to get a true AP view, the C-arm was positioned so that an oblique view of the foramen as noted above was visualized. The target area is just inferior to the "nose of the scotty dog" or sub pedicular. The soft tissues overlying this structure were infiltrated with 2-3 ml. of 1% Lidocaine without Epinephrine.  The spinal needle was inserted toward the target using a "trajectory" view along the fluoroscope beam.  Under AP and lateral visualization, the needle was advanced so it did not puncture dura and was located close the 6 O'Clock position of the pedical in AP tracterory. Biplanar projections were used to confirm position. Aspiration was confirmed to be negative for CSF and/or blood. A 1-2 ml. volume of Isovue-250 was injected and flow of contrast was noted at each level. Radiographs were obtained for documentation purposes.   After attaining the desired flow of contrast documented above, a 0.5 to 1.0 ml test dose of 0.25% Marcaine was injected into each respective transforaminal space.  The patient was observed for 90 seconds post injection.  After no sensory deficits were reported, and normal lower extremity motor function was noted,   the above injectate was  administered so that equal amounts of the injectate were placed at each foramen (level) into the transforaminal epidural space.   Additional Comments:  No complications occurred Dressing: 2 x 2 sterile gauze and Band-Aid    Post-procedure details: Patient was observed during the procedure. Post-procedure  instructions were reviewed.  Patient left the clinic in stable condition.    Clinical History: MRI LUMBAR SPINE WITHOUT CONTRAST   TECHNIQUE: Multiplanar, multisequence MR imaging of the lumbar spine was performed. No intravenous contrast was administered.   COMPARISON:  Lumbar spine radiographs 10/21/2022.   FINDINGS: Segmentation: Transitional anatomy with left hemi lumbarization of the S1 segment and fully formed S1-2 disc.   Alignment:  Normal.   Vertebrae: Postoperative changes of L5-S1 posterior spinal and interbody fusion. No suspicious marrow lesions.   Conus medullaris and cauda equina: Conus extends to the L1-2 level. Conus and cauda equina appear normal.   Paraspinal and other soft tissues: Unremarkable.   Disc levels:   T12-L1: Small disc bulge without spinal canal stenosis or neural foraminal narrowing.   L1-L2:  Unremarkable.   L2-L3: Small disc bulge and mild bilateral facet arthropathy. No spinal canal stenosis or neural foraminal narrowing.   L3-L4: Small disc bulge. No significant spinal canal stenosis. Moderate bilateral facet arthropathy contributes to mild bilateral neural foraminal narrowing.   L4-L5: Adjacent segment. Disc bulge and moderate bilateral facet arthropathy with 2.5 mm synovial cyst in the right lateral recess compressing the traversing right L5 nerve root. Mild central spinal canal stenosis and mild right neural foraminal narrowing.   L5-S1: Prior right hemilaminotomy and interbody fusion. Scarring in the right lateral recess. No spinal canal stenosis. Mild right neural foraminal narrowing.   IMPRESSION: 1. Transitional anatomy with left hemi lumbarization of the S1 segment and fully formed S1-2 disc. 2. L4-L5 adjacent segment disease, with a 2.5 mm synovial cyst in the right lateral recess compressing the traversing right L5 nerve root. Mild central spinal canal stenosis at this level. 3. Postoperative changes from right  hemilaminotomy, interbody and posterior spinal fusion at L5-S1.     Electronically Signed   By: Emmit Alexanders M.D.   On: 12/23/2022 09:16     Objective:  VS:  HT:    WT:   BMI:     BP:(!) 146/63  HR:60bpm  TEMP: ( )  RESP:  Physical Exam Vitals and nursing note reviewed.  Constitutional:      General: He is not in acute distress.    Appearance: Normal appearance. He is not ill-appearing.  HENT:     Head: Normocephalic and atraumatic.     Right Ear: External ear normal.     Left Ear: External ear normal.     Nose: No congestion.  Eyes:     Extraocular Movements: Extraocular movements intact.  Cardiovascular:     Rate and Rhythm: Normal rate.     Pulses: Normal pulses.  Pulmonary:     Effort: Pulmonary effort is normal. No respiratory distress.  Abdominal:     General: There is no distension.     Palpations: Abdomen is soft.  Musculoskeletal:        General: No tenderness or signs of injury.     Cervical back: Neck supple.     Right lower leg: No edema.     Left lower leg: No edema.     Comments: Patient has good distal strength without clonus.  Skin:    Findings: No erythema or rash.  Neurological:  General: No focal deficit present.     Mental Status: He is alert and oriented to person, place, and time.     Sensory: No sensory deficit.     Motor: No weakness or abnormal muscle tone.     Coordination: Coordination normal.  Psychiatric:        Mood and Affect: Mood normal.        Behavior: Behavior normal.      Imaging: No results found.

## 2023-01-26 ENCOUNTER — Encounter: Payer: Self-pay | Admitting: Physical Medicine and Rehabilitation

## 2023-02-01 ENCOUNTER — Encounter: Payer: Self-pay | Admitting: Physical Medicine and Rehabilitation

## 2023-02-01 ENCOUNTER — Ambulatory Visit: Payer: Medicare Other | Admitting: Physical Medicine and Rehabilitation

## 2023-02-01 DIAGNOSIS — M961 Postlaminectomy syndrome, not elsewhere classified: Secondary | ICD-10-CM | POA: Diagnosis not present

## 2023-02-01 DIAGNOSIS — M79604 Pain in right leg: Secondary | ICD-10-CM

## 2023-02-01 DIAGNOSIS — M5416 Radiculopathy, lumbar region: Secondary | ICD-10-CM

## 2023-02-01 NOTE — Progress Notes (Unsigned)
Harry Kline - 89 y.o. male MRN [id number]  Date of birth: March 15, 1934  Office Visit Note: Visit Date: 02/01/2023 PCP: Luetta Nutting, DO Referred by: Luetta Nutting, DO  Subjective: Chief Complaint  Patient presents with   Lower Back - Pain   HPI: Harry Kline is a 89 y.o. male who comes in today for evaluation of chronic, worsening and severe right sided medial thigh pain radiating down to knee and lower leg. Pain ongoing for several years, Pain worsens with walking and sleeping, he describes pain as burning sensation, currently rates as 7 out of 10. Some relief of pain with home exercise regimen, rest and medications. Does use topical pain creams intermittently. Recent lumbar MRI imaging exhibits transitional anatomy with left hemi lumbarization of the S1. There is adjacent level disease at L4-L5 2.5 mm synovial cyst in the right lateral recess compressing the traversing right L5 nerve root. Postoperative changes from right hemilaminotomy, interbody and posterior spinal fusion at L5-S1. History of L5-S1 fusion several years ago by Dr. Twanna Hy in [place]. Patient recently underwent right L5 transforaminal epidural steroid injection in our office on 01/11/2023 and reports no relief of pain with this procedure. Patient states he is normally very active and does walk several miles a week, states he has been unable to walk due to severe pain. Patient denies focal weakness, numbness and tingling. No recent trauma or falls.     {Oswestry Disability Score:26558}  Review of Systems  Musculoskeletal:  Positive for back pain.  Neurological:  Negative for tingling, sensory change, focal weakness and weakness.  All other systems reviewed and are negative.  Otherwise per HPI.  Assessment & Plan: Visit Diagnoses:    ICD-10-CM   1. Lumbar radiculopathy  M54.16 Ambulatory referral to Physical Medicine Rehab    2. Post laminectomy syndrome  M96.1 Ambulatory referral to Physical Medicine Rehab     3. Pain in right leg  M79.604 Ambulatory referral to Physical Medicine Rehab       Plan: Findings:  Chronic, worsening and severe right sided medial thigh pain radiating down to knee and lower leg.  Patient continues to have pain despite good conservative therapy such as home exercise regimen, rest and use of medications.  Patient's clinical presentation and exam are consistent with L4 nerve pattern.  Most severe pain is to the medial aspect of the right thigh radiating down to the knee and lower leg.  There is synovial cyst in the right lateral recess compressing the transversing right L5 nerve root.  There is transitional anatomy with lumbarization of the S1 segment on recent MRI imaging.  Next step is to perform diagnostic and hopefully therapeutic right L4 transforaminal epidural steroid injection under fluoroscopic guidance.  If good relief of pain with this injection we can repeat this procedure infrequently as needed. If his pain is more lower back we could look at possible facet joint injections, there is moderate facet arthropathy noted at L4-L5. If his pain persists post interventional spine procedures we would recommend he follow up with our spine surgeon Dr. Laurance Flatten. No red flag symptoms noted upon exam today.     Meds & Orders: No orders of the defined types were placed in this encounter.   Orders Placed This Encounter  Procedures   Ambulatory referral to Physical Medicine Rehab    Follow-up: Return for Right L4 transforaminal epidural steroid injection.   Procedures: No procedures performed      Clinical History: MRI LUMBAR SPINE WITHOUT CONTRAST  TECHNIQUE: Multiplanar, multisequence MR imaging of the lumbar spine was performed. No intravenous contrast was administered.   COMPARISON:  Lumbar spine radiographs 10/21/2022.   FINDINGS: Segmentation: Transitional anatomy with left hemi lumbarization of the S1 segment and fully formed S1-2 disc.   Alignment:  Normal.    Vertebrae: Postoperative changes of L5-S1 posterior spinal and interbody fusion. No suspicious marrow lesions.   Conus medullaris and cauda equina: Conus extends to the L1-2 level. Conus and cauda equina appear normal.   Paraspinal and other soft tissues: Unremarkable.   Disc levels:   T12-L1: Small disc bulge without spinal canal stenosis or neural foraminal narrowing.   L1-L2:  Unremarkable.   L2-L3: Small disc bulge and mild bilateral facet arthropathy. No spinal canal stenosis or neural foraminal narrowing.   L3-L4: Small disc bulge. No significant spinal canal stenosis. Moderate bilateral facet arthropathy contributes to mild bilateral neural foraminal narrowing.   L4-L5: Adjacent segment. Disc bulge and moderate bilateral facet arthropathy with 2.5 mm synovial cyst in the right lateral recess compressing the traversing right L5 nerve root. Mild central spinal canal stenosis and mild right neural foraminal narrowing.   L5-S1: Prior right hemilaminotomy and interbody fusion. Scarring in the right lateral recess. No spinal canal stenosis. Mild right neural foraminal narrowing.   IMPRESSION: 1. Transitional anatomy with left hemi lumbarization of the S1 segment and fully formed S1-2 disc. 2. L4-L5 adjacent segment disease, with a 2.5 mm synovial cyst in the right lateral recess compressing the traversing right L5 nerve root. Mild central spinal canal stenosis at this level. 3. Postoperative changes from right hemilaminotomy, interbody and posterior spinal fusion at L5-S1.     Electronically Signed   By: Emmit Alexanders M.D.   On: 12/23/2022 09:16   He reports that he quit smoking about 49 years ago. His smoking use included cigarettes. He has a 20.00 pack-year smoking history. He has never used smokeless tobacco. No results for input(s): "HGBA1C", "LABURIC" in the last 8760 hours.  Objective:  VS:  HT:    WT:   BMI:     BP:   HR: bpm  TEMP: ( )  RESP:   Physical Exam Vitals and nursing note reviewed.  HENT:     Head: Normocephalic and atraumatic.     Right Ear: External ear normal.     Left Ear: External ear normal.     Nose: Nose normal.     Mouth/Throat:     Mouth: Mucous membranes are moist.  Eyes:     Extraocular Movements: Extraocular movements intact.  Cardiovascular:     Rate and Rhythm: Normal rate.     Pulses: Normal pulses.  Pulmonary:     Effort: Pulmonary effort is normal.  Abdominal:     General: Abdomen is flat. There is no distension.  Musculoskeletal:        General: Tenderness present.     Cervical back: Normal range of motion.     Comments: Patient rises from seated position to standing without difficulty. Good lumbar range of motion. No pain noted with facet loading. 5/5 strength noted with bilateral hip flexion, knee flexion/extension, ankle dorsiflexion/plantarflexion and EHL. No clonus noted bilaterally. No pain upon palpation of greater trochanters. No pain with internal/external rotation of bilateral hips. Sensation intact bilaterally. Dysesthesias noted to right L4 dermatome. Ambulates without aid, gait steady.     Skin:    General: Skin is warm and dry.     Capillary Refill: Capillary refill takes less than  2 seconds.  Neurological:     General: No focal deficit present.     Mental Status: He is alert and oriented to person, place, and time.  Psychiatric:        Mood and Affect: Mood normal.        Behavior: Behavior normal.     Ortho Exam  Imaging: No results found.  Past Medical/Family/Surgical/Social History: Medications & Allergies reviewed per EMR, new medications updated. Patient Active Problem List   Diagnosis Date Noted   Well adult exam 01/20/2023   Arthritis of knee 06/23/2022   OA (osteoarthritis) of knee 06/23/2022   History of squamous cell carcinoma 09/09/2020   Pacemaker 02/19/2020   History of lumbar fusion 02/19/2020   Hyperlipidemia 02/19/2020   BPH (benign prostatic  hyperplasia) 02/19/2020   Hypertension 02/19/2020   Atrial flutter ([place]) 02/19/2020   Paroxysmal atrial fibrillation ([place]) 02/19/2020   Mitral valve regurgitation 02/19/2020   First degree AV block 02/19/2020   Obesity (BMI 30.0-34.9) 02/19/2020   Insomnia 02/19/2020   Past Medical History:  Diagnosis Date   Arthritis 2010   Diagnosed but not enough to treat with medication.   Atrial fibrillation ([facility])    Atrial fibrillation (H[facility])    CAD (coronary artery disease)    Cancer ([place])    Cataract 2009   surgery both eyes   Chronic kidney disease    GERD (gastroesophageal reflux disease) 2000   Hyperlipidemia    Hypertension    OSA (obstructive sleep apnea)    Pacemaker    [employer]tr[employer]-   Skin cancer    Sleep apnea 1990   c-pap machine   Status post dilation of esophageal narrowing    Well adult exam 01/20/2023   Family History  Problem Relation Age of Onset   Breast cancer Mother    Cancer Mother    Heart failure Father    Heart disease Father    Healthy Sister    Diabetes Paternal Grandmother    Heart failure Paternal Grandfather    Heart disease Paternal Grandfather    Stomach cancer Neg Hx    Esophageal cancer Neg Hx    Colon cancer Neg Hx    Rectal cancer Neg Hx    Past Surgical History:  Procedure Laterality Date   COLONOSCOPY     CORONARY ARTERY BYPASS GRAFT     November 2003   [place]   RK surgery   JOINT REPLACEMENT  June 23 2022   Rt knee replacement   SPINE SURGERY  2014   L4,L5 fusion   TONSILLECTOMY     TOTAL KNEE ARTHROPLASTY Right 06/23/2022   Procedure: RIGHT TOTAL KNEE ARTHROPLASTY;  Surgeon: Meredith Pel, MD;  Location: [facility];  Service: Orthopedics;  Laterality: Right;   UPPER GASTROINTESTINAL ENDOSCOPY     Social History   Occupational History   Occupation: Retired.   Occupation: retired  Tobacco Use   Smoking status: Former    Packs/day: 1.00    Years: 20.00    Total pack years: 20.00    Types: Cigarettes    Quit  date: 11/30/1973    Years since quitting: 49.2   Smokeless tobacco: Never  Vaping Use   Vaping Use: Never used  Substance and Sexual Activity   Alcohol use: Yes    Alcohol/week: 2.0 standard drinks of alcohol    Types: 2 Cans of beer per week    Comment: a beer 3-4 a week   Drug use: Never  Sexual activity: Not Currently

## 2023-02-01 NOTE — Progress Notes (Unsigned)
Functional Pain Scale - descriptive words and definitions  No Pain (0)   No Pain/Loss of function  Average Pain  varies with activity  Follow up from injection. Did not help, pain did not go away. Pain went away due to resting. Walking makes pain worse

## 2023-02-04 ENCOUNTER — Encounter: Payer: Self-pay | Admitting: *Deleted

## 2023-02-09 DIAGNOSIS — G4733 Obstructive sleep apnea (adult) (pediatric): Secondary | ICD-10-CM | POA: Diagnosis not present

## 2023-02-10 ENCOUNTER — Ambulatory Visit: Payer: Self-pay

## 2023-02-10 ENCOUNTER — Ambulatory Visit: Payer: Medicare Other | Admitting: Physical Medicine and Rehabilitation

## 2023-02-10 VITALS — BP 132/78 | HR 75

## 2023-02-10 DIAGNOSIS — M5416 Radiculopathy, lumbar region: Secondary | ICD-10-CM

## 2023-02-10 DIAGNOSIS — G4733 Obstructive sleep apnea (adult) (pediatric): Secondary | ICD-10-CM | POA: Diagnosis not present

## 2023-02-10 DIAGNOSIS — M961 Postlaminectomy syndrome, not elsewhere classified: Secondary | ICD-10-CM

## 2023-02-10 MED ORDER — METHYLPREDNISOLONE ACETATE 80 MG/ML IJ SUSP
80.0000 mg | Freq: Once | INTRAMUSCULAR | Status: AC
  Administered 2023-02-10: 80 mg

## 2023-02-10 NOTE — Progress Notes (Signed)
Functional Pain Scale - descriptive words and definitions  Mild (2)   Noticeable when not distracted/no impact on ADL's/sleep only slightly affected and able to   use both passive and active distraction for comfort. Mild range order  Average Pain 0   +Driver, -BT, -Dye Allergies.  Lower back pain on right side with radiation in right leg at times

## 2023-02-10 NOTE — Patient Instructions (Signed)

## 2023-02-17 NOTE — Progress Notes (Signed)
Harry Kline - 89 y.o. male MRN [id number]  Date of birth: 1934-06-14  Office Visit Note: Visit Date: 02/10/2023 PCP: Luetta Nutting, DO Referred by: Luetta Nutting, DO  Subjective: Chief Complaint  Patient presents with   Lower Back - Pain   HPI:  Harry Kline is a 89 y.o. male who comes in today at the request of Barnet Pall, FNP and Dr. Landry Dyke Dean for planned Right L4-5 Lumbar Transforaminal epidural steroid injection with fluoroscopic guidance.  The patient has failed conservative care including home exercise, medications, time and activity modification.  This injection will be diagnostic and hopefully therapeutic.  Please see requesting physician notes for further details and justification.    ROS Otherwise per HPI.  Assessment & Plan: Visit Diagnoses:    ICD-10-CM   1. Lumbar radiculopathy  M54.16 XR C-ARM NO REPORT    Epidural Steroid injection    methylPREDNISolone acetate (DEPO-MEDROL) injection 80 mg    2. Post laminectomy syndrome  M96.1       Plan: No additional findings.   Meds & Orders:  Meds ordered this encounter  Medications   methylPREDNISolone acetate (DEPO-MEDROL) injection 80 mg    Orders Placed This Encounter  Procedures   XR C-ARM NO REPORT   Epidural Steroid injection    Follow-up: Return for visit to requesting provider as needed.   Procedures: No procedures performed  Lumbosacral Transforaminal Epidural Steroid Injection - Sub-Pedicular Approach with Fluoroscopic Guidance  Patient: Harry Kline      Date of Birth: 11/02/1934 MRN: [id number] PCP: Luetta Nutting, DO      Visit Date: 02/10/2023   Universal Protocol:    Date/Time: 02/10/2023  Consent Given By: the patient  Position: PRONE  Additional Comments: Vital signs were monitored before and after the procedure. Patient was prepped and draped in the usual sterile fashion. The correct patient, procedure, and site was verified.   Injection Procedure Details:    Procedure diagnoses: Lumbar radiculopathy [M54.16]    Meds Administered:  Meds ordered this encounter  Medications   methylPREDNISolone acetate (DEPO-MEDROL) injection 80 mg    Laterality: Right  Location/Site: L4  Needle:5.0 in., 22 ga.  Short bevel or Quincke spinal needle  Needle Placement: Transforaminal  Findings:    -Comments: Excellent flow of contrast along the nerve, nerve root and into the epidural space.  Procedure Details: After squaring off the end-plates to get a true AP view, the C-arm was positioned so that an oblique view of the foramen as noted above was visualized. The target area is just inferior to the "nose of the scotty dog" or sub pedicular. The soft tissues overlying this structure were infiltrated with 2-3 ml. of 1% Lidocaine without Epinephrine.  The spinal needle was inserted toward the target using a "trajectory" view along the fluoroscope beam.  Under AP and lateral visualization, the needle was advanced so it did not puncture dura and was located close the 6 O'Clock position of the pedical in AP tracterory. Biplanar projections were used to confirm position. Aspiration was confirmed to be negative for CSF and/or blood. A 1-2 ml. volume of Isovue-250 was injected and flow of contrast was noted at each level. Radiographs were obtained for documentation purposes.   After attaining the desired flow of contrast documented above, a 0.5 to 1.0 ml test dose of 0.25% Marcaine was injected into each respective transforaminal space.  The patient was observed for 90 seconds post injection.  After no sensory deficits were reported, and normal lower  extremity motor function was noted,   the above injectate was administered so that equal amounts of the injectate were placed at each foramen (level) into the transforaminal epidural space.   Additional Comments:  The patient tolerated the procedure well Dressing: 2 x 2 sterile gauze and Band-Aid    Post-procedure  details: Patient was observed during the procedure. Post-procedure instructions were reviewed.  Patient left the clinic in stable condition.     Clinical History: MRI LUMBAR SPINE WITHOUT CONTRAST   TECHNIQUE: Multiplanar, multisequence MR imaging of the lumbar spine was performed. No intravenous contrast was administered.   COMPARISON:  Lumbar spine radiographs 10/21/2022.   FINDINGS: Segmentation: Transitional anatomy with left hemi lumbarization of the S1 segment and fully formed S1-2 disc.   Alignment:  Normal.   Vertebrae: Postoperative changes of L5-S1 posterior spinal and interbody fusion. No suspicious marrow lesions.   Conus medullaris and cauda equina: Conus extends to the L1-2 level. Conus and cauda equina appear normal.   Paraspinal and other soft tissues: Unremarkable.   Disc levels:   T12-L1: Small disc bulge without spinal canal stenosis or neural foraminal narrowing.   L1-L2:  Unremarkable.   L2-L3: Small disc bulge and mild bilateral facet arthropathy. No spinal canal stenosis or neural foraminal narrowing.   L3-L4: Small disc bulge. No significant spinal canal stenosis. Moderate bilateral facet arthropathy contributes to mild bilateral neural foraminal narrowing.   L4-L5: Adjacent segment. Disc bulge and moderate bilateral facet arthropathy with 2.5 mm synovial cyst in the right lateral recess compressing the traversing right L5 nerve root. Mild central spinal canal stenosis and mild right neural foraminal narrowing.   L5-S1: Prior right hemilaminotomy and interbody fusion. Scarring in the right lateral recess. No spinal canal stenosis. Mild right neural foraminal narrowing.   IMPRESSION: 1. Transitional anatomy with left hemi lumbarization of the S1 segment and fully formed S1-2 disc. 2. L4-L5 adjacent segment disease, with a 2.5 mm synovial cyst in the right lateral recess compressing the traversing right L5 nerve root. Mild central spinal  canal stenosis at this level. 3. Postoperative changes from right hemilaminotomy, interbody and posterior spinal fusion at L5-S1.     Electronically Signed   By: Emmit Alexanders M.D.   On: 12/23/2022 09:16     Objective:  VS:  HT:    WT:   BMI:     BP:132/78  HR:75bpm  TEMP: ( )  RESP:  Physical Exam Vitals and nursing note reviewed.  Constitutional:      General: He is not in acute distress.    Appearance: Normal appearance. He is not ill-appearing.  HENT:     Head: Normocephalic and atraumatic.     Right Ear: External ear normal.     Left Ear: External ear normal.     Nose: No congestion.  Eyes:     Extraocular Movements: Extraocular movements intact.  Cardiovascular:     Rate and Rhythm: Normal rate.     Pulses: Normal pulses.  Pulmonary:     Effort: Pulmonary effort is normal. No respiratory distress.  Abdominal:     General: There is no distension.     Palpations: Abdomen is soft.  Musculoskeletal:        General: No tenderness or signs of injury.     Cervical back: Neck supple.     Right lower leg: No edema.     Left lower leg: No edema.     Comments: Patient has good distal strength without clonus.  Skin:    Findings: No erythema or rash.  Neurological:     General: No focal deficit present.     Mental Status: He is alert and oriented to person, place, and time.     Sensory: No sensory deficit.     Motor: No weakness or abnormal muscle tone.     Coordination: Coordination normal.  Psychiatric:        Mood and Affect: Mood normal.        Behavior: Behavior normal.      Imaging: No results found.

## 2023-02-17 NOTE — Procedures (Signed)
Lumbosacral Transforaminal Epidural Steroid Injection - Sub-Pedicular Approach with Fluoroscopic Guidance  Patient: Harry Kline      Date of Birth: [birth year] MRN: [id number] PCP: Luetta Nutting, DO      Visit Date: 02/10/2023   Universal Protocol:    Date/Time: 02/10/2023  Consent Given By: the patient  Position: PRONE  Additional Comments: Vital signs were monitored before and after the procedure. Patient was prepped and draped in the usual sterile fashion. The correct patient, procedure, and site was verified.   Injection Procedure Details:   Procedure diagnoses: Lumbar radiculopathy [M54.16]    Meds Administered:  Meds ordered this encounter  Medications   methylPREDNISolone acetate (DEPO-MEDROL) injection 80 mg    Laterality: Right  Location/Site: L4  Needle:5.0 in., 22 ga.  Short bevel or Quincke spinal needle  Needle Placement: Transforaminal  Findings:    -Comments: Excellent flow of contrast along the nerve, nerve root and into the epidural space.  Procedure Details: After squaring off the end-plates to get a true AP view, the C-arm was positioned so that an oblique view of the foramen as noted above was visualized. The target area is just inferior to the "nose of the scotty dog" or sub pedicular. The soft tissues overlying this structure were infiltrated with 2-3 ml. of 1% Lidocaine without Epinephrine.  The spinal needle was inserted toward the target using a "trajectory" view along the fluoroscope beam.  Under AP and lateral visualization, the needle was advanced so it did not puncture dura and was located close the 6 O'Clock position of the pedical in AP tracterory. Biplanar projections were used to confirm position. Aspiration was confirmed to be negative for CSF and/or blood. A 1-2 ml. volume of Isovue-250 was injected and flow of contrast was noted at each level. Radiographs were obtained for documentation purposes.   After attaining the desired flow of  contrast documented above, a 0.5 to 1.0 ml test dose of 0.25% Marcaine was injected into each respective transforaminal space.  The patient was observed for 90 seconds post injection.  After no sensory deficits were reported, and normal lower extremity motor function was noted,   the above injectate was administered so that equal amounts of the injectate were placed at each foramen (level) into the transforaminal epidural space.   Additional Comments:  The patient tolerated the procedure well Dressing: 2 x 2 sterile gauze and Band-Aid    Post-procedure details: Patient was observed during the procedure. Post-procedure instructions were reviewed.  Patient left the clinic in stable condition.

## 2023-03-01 DIAGNOSIS — R262 Difficulty in walking, not elsewhere classified: Secondary | ICD-10-CM | POA: Diagnosis not present

## 2023-03-01 DIAGNOSIS — M25661 Stiffness of right knee, not elsewhere classified: Secondary | ICD-10-CM | POA: Diagnosis not present

## 2023-03-01 DIAGNOSIS — M6281 Muscle weakness (generalized): Secondary | ICD-10-CM | POA: Diagnosis not present

## 2023-03-01 DIAGNOSIS — M25561 Pain in right knee: Secondary | ICD-10-CM | POA: Diagnosis not present

## 2023-03-02 ENCOUNTER — Encounter: Payer: Self-pay | Admitting: Physical Medicine and Rehabilitation

## 2023-03-05 DIAGNOSIS — M25561 Pain in right knee: Secondary | ICD-10-CM | POA: Diagnosis not present

## 2023-03-05 DIAGNOSIS — M6281 Muscle weakness (generalized): Secondary | ICD-10-CM | POA: Diagnosis not present

## 2023-03-05 DIAGNOSIS — R262 Difficulty in walking, not elsewhere classified: Secondary | ICD-10-CM | POA: Diagnosis not present

## 2023-03-05 DIAGNOSIS — M25661 Stiffness of right knee, not elsewhere classified: Secondary | ICD-10-CM | POA: Diagnosis not present

## 2023-03-09 ENCOUNTER — Ambulatory Visit (INDEPENDENT_AMBULATORY_CARE_PROVIDER_SITE_OTHER): Payer: Medicare Other

## 2023-03-09 DIAGNOSIS — I440 Atrioventricular block, first degree: Secondary | ICD-10-CM

## 2023-03-10 DIAGNOSIS — M25661 Stiffness of right knee, not elsewhere classified: Secondary | ICD-10-CM | POA: Diagnosis not present

## 2023-03-10 DIAGNOSIS — R262 Difficulty in walking, not elsewhere classified: Secondary | ICD-10-CM | POA: Diagnosis not present

## 2023-03-10 DIAGNOSIS — M25561 Pain in right knee: Secondary | ICD-10-CM | POA: Diagnosis not present

## 2023-03-10 DIAGNOSIS — M6281 Muscle weakness (generalized): Secondary | ICD-10-CM | POA: Diagnosis not present

## 2023-03-10 LAB — CUP PACEART REMOTE DEVICE CHECK
Battery Remaining Longevity: 96 mo
Battery Voltage: 3 V
Brady Statistic RV Percent Paced: 68.61 %
Date Time Interrogation Session: 20240410133900
Implantable Pulse Generator Implant Date: 20210128
Lead Channel Impedance Value: 540 Ohm
Lead Channel Pacing Threshold Amplitude: 0.5 V
Lead Channel Pacing Threshold Pulse Width: 0.24 ms
Lead Channel Sensing Intrinsic Amplitude: 9.225 mV
Lead Channel Setting Pacing Amplitude: 1 V
Lead Channel Setting Pacing Pulse Width: 0.24 ms
Lead Channel Setting Sensing Sensitivity: 2 mV

## 2023-03-12 DIAGNOSIS — M25661 Stiffness of right knee, not elsewhere classified: Secondary | ICD-10-CM | POA: Diagnosis not present

## 2023-03-12 DIAGNOSIS — M6281 Muscle weakness (generalized): Secondary | ICD-10-CM | POA: Diagnosis not present

## 2023-03-12 DIAGNOSIS — M25561 Pain in right knee: Secondary | ICD-10-CM | POA: Diagnosis not present

## 2023-03-12 DIAGNOSIS — R262 Difficulty in walking, not elsewhere classified: Secondary | ICD-10-CM | POA: Diagnosis not present

## 2023-03-15 DIAGNOSIS — M6281 Muscle weakness (generalized): Secondary | ICD-10-CM | POA: Diagnosis not present

## 2023-03-15 DIAGNOSIS — M25661 Stiffness of right knee, not elsewhere classified: Secondary | ICD-10-CM | POA: Diagnosis not present

## 2023-03-15 DIAGNOSIS — R262 Difficulty in walking, not elsewhere classified: Secondary | ICD-10-CM | POA: Diagnosis not present

## 2023-03-15 DIAGNOSIS — M25561 Pain in right knee: Secondary | ICD-10-CM | POA: Diagnosis not present

## 2023-03-17 DIAGNOSIS — M6281 Muscle weakness (generalized): Secondary | ICD-10-CM | POA: Diagnosis not present

## 2023-03-17 DIAGNOSIS — M25561 Pain in right knee: Secondary | ICD-10-CM | POA: Diagnosis not present

## 2023-03-17 DIAGNOSIS — R262 Difficulty in walking, not elsewhere classified: Secondary | ICD-10-CM | POA: Diagnosis not present

## 2023-03-17 DIAGNOSIS — M25661 Stiffness of right knee, not elsewhere classified: Secondary | ICD-10-CM | POA: Diagnosis not present

## 2023-03-24 DIAGNOSIS — M25561 Pain in right knee: Secondary | ICD-10-CM | POA: Diagnosis not present

## 2023-03-24 DIAGNOSIS — R262 Difficulty in walking, not elsewhere classified: Secondary | ICD-10-CM | POA: Diagnosis not present

## 2023-03-24 DIAGNOSIS — M6281 Muscle weakness (generalized): Secondary | ICD-10-CM | POA: Diagnosis not present

## 2023-03-24 DIAGNOSIS — M25661 Stiffness of right knee, not elsewhere classified: Secondary | ICD-10-CM | POA: Diagnosis not present

## 2023-03-26 DIAGNOSIS — M6281 Muscle weakness (generalized): Secondary | ICD-10-CM | POA: Diagnosis not present

## 2023-03-26 DIAGNOSIS — M25561 Pain in right knee: Secondary | ICD-10-CM | POA: Diagnosis not present

## 2023-03-26 DIAGNOSIS — R262 Difficulty in walking, not elsewhere classified: Secondary | ICD-10-CM | POA: Diagnosis not present

## 2023-03-26 DIAGNOSIS — M25661 Stiffness of right knee, not elsewhere classified: Secondary | ICD-10-CM | POA: Diagnosis not present

## 2023-03-29 DIAGNOSIS — M25561 Pain in right knee: Secondary | ICD-10-CM | POA: Diagnosis not present

## 2023-03-29 DIAGNOSIS — R262 Difficulty in walking, not elsewhere classified: Secondary | ICD-10-CM | POA: Diagnosis not present

## 2023-03-29 DIAGNOSIS — M6281 Muscle weakness (generalized): Secondary | ICD-10-CM | POA: Diagnosis not present

## 2023-03-29 DIAGNOSIS — M25661 Stiffness of right knee, not elsewhere classified: Secondary | ICD-10-CM | POA: Diagnosis not present

## 2023-04-16 NOTE — Progress Notes (Signed)
Remote pacemaker transmission.   

## 2023-04-28 DIAGNOSIS — K080 Exfoliation of teeth due to systemic causes: Secondary | ICD-10-CM | POA: Diagnosis not present

## 2023-05-05 DIAGNOSIS — G4733 Obstructive sleep apnea (adult) (pediatric): Secondary | ICD-10-CM | POA: Diagnosis not present

## 2023-06-08 ENCOUNTER — Ambulatory Visit (INDEPENDENT_AMBULATORY_CARE_PROVIDER_SITE_OTHER): Payer: Medicare Other

## 2023-06-08 DIAGNOSIS — I440 Atrioventricular block, first degree: Secondary | ICD-10-CM

## 2023-06-09 LAB — CUP PACEART REMOTE DEVICE CHECK
Battery Remaining Longevity: 96 mo
Battery Voltage: 3 V
Brady Statistic RV Percent Paced: 68.01 %
Date Time Interrogation Session: 20240710133800
Implantable Pulse Generator Implant Date: 20210128
Lead Channel Impedance Value: 520 Ohm
Lead Channel Pacing Threshold Amplitude: 0.375 V
Lead Channel Pacing Threshold Pulse Width: 0.24 ms
Lead Channel Sensing Intrinsic Amplitude: 7.2 mV
Lead Channel Setting Pacing Amplitude: 1 V
Lead Channel Setting Pacing Pulse Width: 0.24 ms
Lead Channel Setting Sensing Sensitivity: 2 mV

## 2023-06-10 DIAGNOSIS — D6869 Other thrombophilia: Secondary | ICD-10-CM | POA: Diagnosis not present

## 2023-06-10 DIAGNOSIS — I2510 Atherosclerotic heart disease of native coronary artery without angina pectoris: Secondary | ICD-10-CM | POA: Diagnosis not present

## 2023-07-01 NOTE — Progress Notes (Signed)
Remote pacemaker transmission.   

## 2023-07-06 ENCOUNTER — Ambulatory Visit: Payer: Medicare Other | Admitting: Cardiology

## 2023-07-06 ENCOUNTER — Encounter: Payer: Self-pay | Admitting: Cardiology

## 2023-07-06 VITALS — BP 160/90 | HR 57 | Ht 70.0 in | Wt 225.0 lb

## 2023-07-06 DIAGNOSIS — I10 Essential (primary) hypertension: Secondary | ICD-10-CM

## 2023-07-06 DIAGNOSIS — I2510 Atherosclerotic heart disease of native coronary artery without angina pectoris: Secondary | ICD-10-CM

## 2023-07-06 DIAGNOSIS — I440 Atrioventricular block, first degree: Secondary | ICD-10-CM | POA: Diagnosis not present

## 2023-07-06 DIAGNOSIS — D6869 Other thrombophilia: Secondary | ICD-10-CM

## 2023-07-06 DIAGNOSIS — I4821 Permanent atrial fibrillation: Secondary | ICD-10-CM

## 2023-07-06 LAB — CUP PACEART INCLINIC DEVICE CHECK
Battery Remaining Longevity: 96 mo
Battery Voltage: 3 V
Brady Statistic RV Percent Paced: 68.04 %
Date Time Interrogation Session: 20240806201330
Implantable Pulse Generator Implant Date: 20210128
Lead Channel Impedance Value: 600 Ohm
Lead Channel Pacing Threshold Amplitude: 0.5 V
Lead Channel Pacing Threshold Pulse Width: 0.24 ms
Lead Channel Sensing Intrinsic Amplitude: 9.788 mV
Lead Channel Setting Pacing Amplitude: 1 V
Lead Channel Setting Pacing Pulse Width: 0.24 ms
Lead Channel Setting Sensing Sensitivity: 2 mV

## 2023-07-06 MED ORDER — IRBESARTAN 150 MG PO TABS: 150.0000 mg | ORAL_TABLET | Freq: Every day | ORAL | 11 refills | Status: DC

## 2023-07-06 NOTE — Patient Instructions (Addendum)
Medication Instructions:  Your physician has recommended you make the following change in your medication:  START Avapro 150 mg once daily  *If you need a refill on your cardiac medications before your next appointment, please call your pharmacy*   Lab Work: None ordered   Testing/Procedures: None ordered   Follow-Up: At [facility], you and your health needs are our priority.  As part of our continuing mission to provide you with exceptional heart care, we have created designated Provider Care Teams.  These Care Teams include your primary Cardiologist (physician) and Advanced Practice Providers (APPs -  Physician Assistants and Nurse Practitioners) who all work together to provide you with the care you need, when you need it.  Remote monitoring is used to monitor your Pacemaker or ICD from home. This monitoring reduces the number of office visits required to check your device to one time per year. It allows [place] to keep an eye on the functioning of your device to ensure it is working properly. You are scheduled for a device check from home on 09/07/23. You may send your transmission at any time that day. If you have a wireless device, the transmission will be sent automatically. After your physician reviews your transmission, you will receive a postcard with your next transmission date.  Your next appointment:   1 year(s)  The format for your next appointment:   In Person  Provider:   [place]   Thank you for choosing [facility]!!   Dory Horn, RN [phone]

## 2023-07-06 NOTE — Progress Notes (Signed)
  Electrophysiology Office Note:   Date:  07/06/2023  ID:  Harry Kline, DOB June 14, 1934, MRN [id number]  Primary Cardiologist: Olga Millers, MD Electrophysiologist: Regan Lemming, MD      History of Present Illness:   Harry Kline is a 89 y.o. male with h/o atrial fibrillation, coronary artery disease, pacemaker seen today for routine electrophysiology followup.  Since last being seen in our clinic the patient reports doing overall well.  He has no chest pain or shortness of breath.  He is able to do all of his daily activities.  He has noted an increase in his blood pressure over the last few months.  He states that his blood pressure has been averaging between 140 and 150 systolic.  Aside from that, he has no acute complaints.  he denies chest pain, palpitations, dyspnea, PND, orthopnea, nausea, vomiting, dizziness, syncope, edema, weight gain, or early satiety.   Review of systems complete and found to be negative unless listed in HPI.      EP Information / Studies Reviewed:    EKG is ordered today. Personal review as below.  EKG Interpretation Date/Time:  Tuesday July 06 2023 15:58:31 EDT Ventricular Rate:  57 PR Interval:    QRS Duration:  92 QT Interval:  416 QTC Calculation: 404 R Axis:   101  Text Interpretation: Atrial fibrillation with slow ventricular response with frequent ventricular-paced complexes Rightward axis Nonspecific ST abnormality No previous ECGs available Confirmed by ,  ([id number]) on 07/06/2023 3:59:23 PM   PPM Interrogation-  reviewed in detail today,  See PACEART report.  Device History: Medtronic Leadless PPM implanted for  atrial fibrillation with slow response  Risk Assessment/Calculations:    CHA2DS2-VASc Score = 4   This indicates a 4.8% annual risk of stroke. The patient's score is based upon: CHF History: 0 HTN History: 1 Diabetes History: 0 Stroke History: 0 Vascular Disease History: 1 Age Score: 2 Gender Score: 0       Physical Exam:   VS:  BP (!) 160/90 (BP Location: Left Arm, Patient Position: Sitting, Cuff Size: Large)   Pulse (!) 57   Ht 5\' 10"  (1.778 m)   Wt 225 lb (102.1 kg)   SpO2 98%   BMI 32.28 kg/m    Wt Readings from Last 3 Encounters:  07/06/23 225 lb (102.1 kg)  01/20/23 218 lb 1.9 oz (98.9 kg)  12/31/22 223 lb (101.2 kg)     GEN: Well nourished, well developed in no acute distress NECK: No JVD; No carotid bruits CARDIAC: Regular rate and rhythm, no murmurs, rubs, gallops RESPIRATORY:  Clear to auscultation without rales, wheezing or rhonchi  ABDOMEN: Soft, non-tender, non-distended EXTREMITIES:  No edema; No deformity   ASSESSMENT AND PLAN:    Atrial fibrillation with slow ventricular response s/p Medtronic PPM  Normal PPM function See Pace Art report No changes today Currently on warfarin  2.  Coronary disease: Status post CABG.  No current chest pain.  Plan per primary cardiology.  3.  Hyperlipidemia: Continue statin per primary cardiology  4.  Secondary hypercoagulable state: Currently on warfarin for atrial fibrillation  5.  Hypertension: Blood pressure is elevated today and has been elevated at home.   start Avapro 150 mg daily.  He should have a BMP checked by his primary physician in 2 weeks.   Disposition:   Follow up with EP APP in 12 months  Signed,  Jorja Loa, MD

## 2023-07-21 ENCOUNTER — Encounter: Payer: Self-pay | Admitting: Family Medicine

## 2023-07-21 ENCOUNTER — Ambulatory Visit (INDEPENDENT_AMBULATORY_CARE_PROVIDER_SITE_OTHER): Payer: Medicare Other | Admitting: Family Medicine

## 2023-07-21 VITALS — BP 154/76 | HR 76 | Ht 70.0 in | Wt 227.5 lb

## 2023-07-21 DIAGNOSIS — I10 Essential (primary) hypertension: Secondary | ICD-10-CM | POA: Diagnosis not present

## 2023-07-21 DIAGNOSIS — Z87891 Personal history of nicotine dependence: Secondary | ICD-10-CM

## 2023-07-21 DIAGNOSIS — Z136 Encounter for screening for cardiovascular disorders: Secondary | ICD-10-CM | POA: Diagnosis not present

## 2023-07-21 DIAGNOSIS — I480 Paroxysmal atrial fibrillation: Secondary | ICD-10-CM

## 2023-07-21 DIAGNOSIS — E7800 Pure hypercholesterolemia, unspecified: Secondary | ICD-10-CM

## 2023-07-21 DIAGNOSIS — Z23 Encounter for immunization: Secondary | ICD-10-CM

## 2023-07-21 NOTE — Progress Notes (Signed)
Harry Kline - 89 y.o. male MRN [id number]  Date of birth: [birth year]  Subjective Chief Complaint  Patient presents with   Hypertension    HPI Harry Kline is a 89 y.o. male here today for follow up visit.    Has history of HTN along with PAF.  Current treatment with irbesartan which was recently added at his cardiology appt.  BP elevated on initial check today.  He does see cardiology/EP regularly.  He is s/p pacemaker.  Denies anginal symptoms, palpitations, headache or vision changes.  Remains anticoagulated with eliquis.   Remains on atorvastatin for HLD and history of CAD.  He is tolerating this well.   ROS:  A comprehensive ROS was completed and negative except as noted per HPI  No Known Allergies  Past Medical History:  Diagnosis Date   Arthritis 2010   Diagnosed but not enough to treat with medication.   Atrial fibrillation (HCC)    Atrial fibrillation (HCC)    CAD (coronary artery disease)    Cancer (HCC)    Cataract 2009   surgery both eyes   Chronic kidney disease    GERD (gastroesophageal reflux disease) 2000   Hyperlipidemia    Hypertension    OSA (obstructive sleep apnea)    Pacemaker    Medtronic-   Skin cancer    Sleep apnea 1990   c-pap machine   Status post dilation of esophageal narrowing    Well adult exam 01/20/2023    Past Surgical History:  Procedure Laterality Date   COLONOSCOPY     CORONARY ARTERY BYPASS GRAFT     November 2003   EYE SURGERY  1985   RK surgery   JOINT REPLACEMENT  June 23 2022   Rt knee replacement   SPINE SURGERY  2014   L4,L5 fusion   TONSILLECTOMY     TOTAL KNEE ARTHROPLASTY Right 06/23/2022   Procedure: RIGHT TOTAL KNEE ARTHROPLASTY;  Surgeon: Cammy Copa, MD;  Location: [facility] OR;  Service: Orthopedics;  Laterality: Right;   UPPER GASTROINTESTINAL ENDOSCOPY      Social History   Socioeconomic History   Marital status: Widowed    Spouse name: Not on file   Number of children: 3   Years of education: 16    Highest education level: Bachelor's degree (e.g., BA, AB, BS)  Occupational History   Occupation: Retired.   Occupation: retired  Tobacco Use   Smoking status: Former    Current packs/day: 0.00    Average packs/day: 1 pack/day for 20.0 years (20.0 ttl pk-yrs)    Types: Cigarettes    Start date: 11/30/1953    Quit date: 11/30/1973    Years since quitting: 49.6   Smokeless tobacco: Never  Vaping Use   Vaping status: Never Used  Substance and Sexual Activity   Alcohol use: Yes    Alcohol/week: 2.0 standard drinks of alcohol    Types: 2 Cans of beer per week    Comment: a beer 3-4 a week   Drug use: Never   Sexual activity: Not Currently  Other Topics Concern   Not on file  Social History Narrative   Lives alone. He has three children. He enjoys traveling.   Social Determinants of Health   Financial Resource Strain: Low Risk  (01/14/2023)   Overall Financial Resource Strain (CARDIA)    Difficulty of Paying Living Expenses: Not hard at all  Food Insecurity: No Food Insecurity (01/14/2023)   Hunger Vital Sign    Worried About Running  Out of Food in the Last Year: Never true    Ran Out of Food in the Last Year: Never true  Transportation Needs: No Transportation Needs (01/14/2023)   PRAPARE - Administrator, Civil Service (Medical): No    Lack of Transportation (Non-Medical): No  Physical Activity: Insufficiently Active (01/14/2023)   Exercise Vital Sign    Days of Exercise per Week: 2 days    Minutes of Exercise per Session: 30 min  Stress: No Stress Concern Present (01/14/2023)   [employer] of Occupational Health - Occupational Stress Questionnaire    Feeling of Stress : Not at all  Social Connections: Moderately Integrated (01/18/2023)   Social Connection and Isolation Panel [NHANES]    Frequency of Communication with Friends and Family: More than three times a week    Frequency of Social Gatherings with Friends and Family: More than three times a week     Attends Religious Services: More than 4 times per year    Active Member of [employer] or Organizations: Yes    Attends Banker Meetings: More than 4 times per year    Marital Status: Widowed    Family History  Problem Relation Age of Onset   Breast cancer Mother    Cancer Mother    Heart failure Father    Heart disease Father    Healthy Sister    Diabetes Paternal Grandmother    Heart failure Paternal Grandfather    Heart disease Paternal Grandfather    Stomach cancer Neg Hx    Esophageal cancer Neg Hx    Colon cancer Neg Hx    Rectal cancer Neg Hx     Health Maintenance  Topic Date Due   Zoster Vaccines- Shingrix (1 of 2) 11/20/1986   COVID-19 Vaccine (5 - 2023-24 season) 07/31/2022   INFLUENZA VACCINE  07/01/2023   Medicare Annual Wellness (AWV)  01/19/2024   DTaP/Tdap/Td (3 - Td or Tdap) 02/06/2030   Pneumonia Vaccine 40+ Years old  Completed   HPV VACCINES  Aged Out     ----------------------------------------------------------------------------------------------------------------------------------------------------------------------------------------------------------------- Physical Exam BP (!) 154/76   Pulse 76   Ht 5\' 10"  (1.778 m)   Wt 227 lb 8 oz (103.2 kg)   SpO2 99%   BMI 32.64 kg/m   Physical Exam Constitutional:      Appearance: Normal appearance.  HENT:     Head: Normocephalic and atraumatic.  Cardiovascular:     Rate and Rhythm: Normal rate and regular rhythm.  Pulmonary:     Effort: Pulmonary effort is normal.     Breath sounds: Normal breath sounds.  Neurological:     Mental Status: He is alert.  Psychiatric:        Mood and Affect: Mood normal.        Behavior: Behavior normal.     ------------------------------------------------------------------------------------------------------------------------------------------------------------------------------------------------------------------- Assessment and Plan  Paroxysmal  atrial fibrillation (HCC) Stable at this time.  Management per cardiology.  Remains on anticoagulation with eliquis.    Hypertension BP elevated in clinic today.  Home readings are much lower, typically in the 135-140/75-85 range.  His home cuff is verified to be accurate today.  Continue current strength of irbesartan. F/u in 6 months.   Hyperlipidemia Doing well with atorvastatin.  Will plan to continue at current strength.   Encounter for screening for abdominal aortic aneurysm (AAA) in patient 29 years of age or older with history of smoking US abdomen for AAA screening ordered.    No orders of  the defined types were placed in this encounter.   Return in about 6 months (around 01/21/2024) for HTN.    This visit occurred during the SARS-CoV-2 public health emergency.  Safety protocols were in place, including screening questions prior to the visit, additional usage of staff PPE, and extensive cleaning of exam room while observing appropriate contact time as indicated for disinfecting solutions.

## 2023-07-21 NOTE — Assessment & Plan Note (Signed)
Doing well with atorvastatin.  Will plan to continue at current strength.

## 2023-07-21 NOTE — Assessment & Plan Note (Signed)
US abdomen for AAA screening ordered.

## 2023-07-21 NOTE — Assessment & Plan Note (Signed)
BP elevated in clinic today.  Home readings are much lower, typically in the 135-140/75-85 range.  His home cuff is verified to be accurate today.  Continue current strength of irbesartan. F/u in 6 months.

## 2023-07-21 NOTE — Assessment & Plan Note (Signed)
Stable at this time.  Management per cardiology.  Remains on anticoagulation with eliquis.

## 2023-07-22 LAB — BASIC METABOLIC PANEL
BUN/Creatinine Ratio: 15 (ref 10–24)
BUN: 20 mg/dL (ref 8–27)
CO2: 23 mmol/L (ref 20–29)
Calcium: 9.2 mg/dL (ref 8.6–10.2)
Chloride: 103 mmol/L (ref 96–106)
Creatinine, Ser: 1.34 mg/dL — ABNORMAL HIGH (ref 0.76–1.27)
Glucose: 99 mg/dL (ref 70–99)
Potassium: 3.9 mmol/L (ref 3.5–5.2)
Sodium: 142 mmol/L (ref 134–144)
eGFR: 52 mL/min/{1.73_m2} — ABNORMAL LOW (ref >59)

## 2023-07-23 ENCOUNTER — Ambulatory Visit (INDEPENDENT_AMBULATORY_CARE_PROVIDER_SITE_OTHER): Payer: Medicare Other

## 2023-07-23 DIAGNOSIS — Z136 Encounter for screening for cardiovascular disorders: Secondary | ICD-10-CM | POA: Diagnosis not present

## 2023-07-23 DIAGNOSIS — Z87891 Personal history of nicotine dependence: Secondary | ICD-10-CM | POA: Diagnosis not present

## 2023-08-05 DIAGNOSIS — I10 Essential (primary) hypertension: Secondary | ICD-10-CM | POA: Diagnosis not present

## 2023-08-05 DIAGNOSIS — Z23 Encounter for immunization: Secondary | ICD-10-CM | POA: Diagnosis not present

## 2023-09-07 ENCOUNTER — Ambulatory Visit (INDEPENDENT_AMBULATORY_CARE_PROVIDER_SITE_OTHER): Payer: Medicare Other

## 2023-09-07 DIAGNOSIS — I4821 Permanent atrial fibrillation: Secondary | ICD-10-CM | POA: Diagnosis not present

## 2023-09-08 LAB — CUP PACEART REMOTE DEVICE CHECK
Battery Remaining Longevity: 96 mo
Battery Voltage: 3 V
Brady Statistic RV Percent Paced: 55.71 %
Date Time Interrogation Session: 20241008170400
Implantable Pulse Generator Implant Date: 20210128
Lead Channel Impedance Value: 490 Ohm
Lead Channel Pacing Threshold Amplitude: 0.375 V
Lead Channel Pacing Threshold Pulse Width: 0.24 ms
Lead Channel Sensing Intrinsic Amplitude: 7.988 mV
Lead Channel Setting Pacing Amplitude: 1 V
Lead Channel Setting Pacing Pulse Width: 0.24 ms
Lead Channel Setting Sensing Sensitivity: 2 mV

## 2023-09-14 ENCOUNTER — Ambulatory Visit (INDEPENDENT_AMBULATORY_CARE_PROVIDER_SITE_OTHER): Payer: Medicare Other | Admitting: Family Medicine

## 2023-09-14 ENCOUNTER — Ambulatory Visit: Payer: Medicare Other

## 2023-09-14 ENCOUNTER — Encounter: Payer: Self-pay | Admitting: Family Medicine

## 2023-09-14 VITALS — BP 135/77 | HR 77 | Ht 70.0 in | Wt 225.0 lb

## 2023-09-14 DIAGNOSIS — R0600 Dyspnea, unspecified: Secondary | ICD-10-CM | POA: Diagnosis not present

## 2023-09-14 DIAGNOSIS — R233 Spontaneous ecchymoses: Secondary | ICD-10-CM | POA: Diagnosis not present

## 2023-09-14 DIAGNOSIS — I700 Atherosclerosis of aorta: Secondary | ICD-10-CM | POA: Diagnosis not present

## 2023-09-14 NOTE — Progress Notes (Signed)
Harry Kline - 89 y.o. male MRN [id number]  Date of birth: 1934/08/26  Subjective Chief Complaint  Patient presents with   Leg Swelling    Left leg swelling  during pt's vacation  pt states he had erythema and edema and sob and had to stop several times     HPI Harry Kline is a 89 y.o. male here today with complaint of leg redness and swelling.  Recently on vacation spending 9 days in [place], 9 days in [place] and a few days in [place]. Noted L leg swelling with erythema.  He did a lot of walking during the trip.  He also admits to increased sodium intake during his trip.   He had some dyspnea as well and had to stop several times while walking around.  This has resolved at this point but wants to know what caused his symptoms.  He denies pain in the leg.  He is taking eliquis for CVA prevention due to history of A. Fib.  Denies chest pain.  He has not had palpitations.  He has picture on his phone that shows mild swelling and petechial appearing rash on the L leg, small amount on the R leg.    ROS:  A comprehensive ROS was completed and negative except as noted per HPI  No Known Allergies  Past Medical History:  Diagnosis Date   Arthritis 2010   Diagnosed but not enough to treat with medication.   Atrial fibrillation (HCC)    Atrial fibrillation (HCC)    CAD (coronary artery disease)    Cancer (HCC)    Cataract 2009   surgery both eyes   Chronic kidney disease    GERD (gastroesophageal reflux disease) 2000   Hyperlipidemia    Hypertension    OSA (obstructive sleep apnea)    Pacemaker    Medtronic-   Skin cancer    Sleep apnea 1990   c-pap machine   Status post dilation of esophageal narrowing    Well adult exam 01/20/2023    Past Surgical History:  Procedure Laterality Date   COLONOSCOPY     CORONARY ARTERY BYPASS GRAFT     November 2003   EYE SURGERY  1985   RK surgery   JOINT REPLACEMENT  June 23 2022   Rt knee replacement   SPINE SURGERY  2014   L4,L5 fusion    TONSILLECTOMY     TOTAL KNEE ARTHROPLASTY Right 06/23/2022   Procedure: RIGHT TOTAL KNEE ARTHROPLASTY;  Surgeon: Cammy Copa, MD;  Location: [facility] OR;  Service: Orthopedics;  Laterality: Right;   UPPER GASTROINTESTINAL ENDOSCOPY      Social History   Socioeconomic History   Marital status: Widowed    Spouse name: Not on file   Number of children: 3   Years of education: 16   Highest education level: Bachelor's degree (e.g., BA, AB, BS)  Occupational History   Occupation: Retired.   Occupation: retired  Tobacco Use   Smoking status: Former    Current packs/day: 0.00    Average packs/day: 1 pack/day for 20.0 years (20.0 ttl pk-yrs)    Types: Cigarettes    Start date: 11/30/1953    Quit date: 11/30/1973    Years since quitting: 49.8   Smokeless tobacco: Never  Vaping Use   Vaping status: Never Used  Substance and Sexual Activity   Alcohol use: Yes    Alcohol/week: 2.0 standard drinks of alcohol    Types: 2 Cans of beer per week  Comment: a beer 3-4 a week   Drug use: Never   Sexual activity: Not Currently  Other Topics Concern   Not on file  Social History Narrative   Lives alone. He has three children. He enjoys traveling.   Social Determinants of Health   Financial Resource Strain: Low Risk  (09/10/2023)   Overall Financial Resource Strain (CARDIA)    Difficulty of Paying Living Expenses: Not hard at all  Food Insecurity: No Food Insecurity (09/10/2023)   Hunger Vital Sign    Worried About Running Out of Food in the Last Year: Never true    Ran Out of Food in the Last Year: Never true  Transportation Needs: No Transportation Needs (09/10/2023)   PRAPARE - Administrator, Civil Service (Medical): No    Lack of Transportation (Non-Medical): No  Physical Activity: Insufficiently Active (09/10/2023)   Exercise Vital Sign    Days of Exercise per Week: 3 days    Minutes of Exercise per Session: 30 min  Stress: No Stress Concern Present (09/10/2023)    [employer] of Occupational Health - Occupational Stress Questionnaire    Feeling of Stress : Not at all  Social Connections: Moderately Integrated (09/10/2023)   Social Connection and Isolation Panel [NHANES]    Frequency of Communication with Friends and Family: More than three times a week    Frequency of Social Gatherings with Friends and Family: More than three times a week    Attends Religious Services: More than 4 times per year    Active Member of [employer] or Organizations: Yes    Attends Banker Meetings: More than 4 times per year    Marital Status: Widowed    Family History  Problem Relation Age of Onset   Breast cancer Mother    Cancer Mother    Heart failure Father    Heart disease Father    Healthy Sister    Diabetes Paternal Grandmother    Heart failure Paternal Grandfather    Heart disease Paternal Grandfather    Stomach cancer Neg Hx    Esophageal cancer Neg Hx    Colon cancer Neg Hx    Rectal cancer Neg Hx     Health Maintenance  Topic Date Due   Zoster Vaccines- Shingrix (1 of 2) 11/20/1986   COVID-19 Vaccine (5 - 2023-24 season) 08/01/2023   Medicare Annual Wellness (AWV)  01/19/2024   DTaP/Tdap/Td (3 - Td or Tdap) 02/06/2030   Pneumonia Vaccine 74+ Years old  Completed   INFLUENZA VACCINE  Completed   HPV VACCINES  Aged Out     ----------------------------------------------------------------------------------------------------------------------------------------------------------------------------------------------------------------- Physical Exam BP 135/77   Pulse 77   Ht 5\' 10"  (1.778 m)   Wt 225 lb (102.1 kg)   SpO2 99%   BMI 32.28 kg/m   Physical Exam Constitutional:      Appearance: Normal appearance.  HENT:     Head: Normocephalic and atraumatic.  Cardiovascular:     Rate and Rhythm: Normal rate and regular rhythm.  Pulmonary:     Effort: Pulmonary effort is normal.     Breath sounds: Normal breath sounds.   Musculoskeletal:     Cervical back: Neck supple.     Comments: No LE swelling.    Neurological:     Mental Status: He is alert.  Psychiatric:        Mood and Affect: Mood normal.        Behavior: Behavior normal.     ------------------------------------------------------------------------------------------------------------------------------------------------------------------------------------------------------------------- Assessment  and Plan  Petechial rash Petechial rash that he shows me and pictures seems of resolved at this point.  This is likely multifactorial secondary to increased walking and swelling causing capillary rupture with blood thinner.  The swelling he was experiencing has resolved and this may be related to increased salt intake during his vacation.  He is pretty much asymptomatic at this point.  Dyspnea Dyspnea has improved.  Poss related to fluid retention during his vacation from excess sodium intake.  He does remain on furosemide.  Lung exam is reassuring however with his history of smoking we will obtain a chest x-ray.   No orders of the defined types were placed in this encounter.   No follow-ups on file.    This visit occurred during the SARS-CoV-2 public health emergency.  Safety protocols were in place, including screening questions prior to the visit, additional usage of staff PPE, and extensive cleaning of exam room while observing appropriate contact time as indicated for disinfecting solutions.

## 2023-09-14 NOTE — Assessment & Plan Note (Signed)
Dyspnea has improved.  Poss related to fluid retention during his vacation from excess sodium intake.  He does remain on furosemide.  Lung exam is reassuring however with his history of smoking we will obtain a chest x-ray.

## 2023-09-14 NOTE — Assessment & Plan Note (Signed)
Petechial rash that he shows me and pictures seems of resolved at this point.  This is likely multifactorial secondary to increased walking and swelling causing capillary rupture with blood thinner.  The swelling he was experiencing has resolved and this may be related to increased salt intake during his vacation.  He is pretty much asymptomatic at this point.

## 2023-09-14 NOTE — Patient Instructions (Signed)
Limit sodium intake. Keep legs elevated at rest.  If walking long distances I would recommend adding compression stockings.

## 2023-09-15 DIAGNOSIS — K080 Exfoliation of teeth due to systemic causes: Secondary | ICD-10-CM | POA: Diagnosis not present

## 2023-09-27 NOTE — Progress Notes (Signed)
Remote pacemaker transmission.   

## 2023-09-28 ENCOUNTER — Ambulatory Visit: Payer: Medicare Other | Admitting: Cardiology

## 2023-09-29 DIAGNOSIS — Z85828 Personal history of other malignant neoplasm of skin: Secondary | ICD-10-CM | POA: Diagnosis not present

## 2023-09-29 DIAGNOSIS — L918 Other hypertrophic disorders of the skin: Secondary | ICD-10-CM | POA: Diagnosis not present

## 2023-09-29 DIAGNOSIS — L821 Other seborrheic keratosis: Secondary | ICD-10-CM | POA: Diagnosis not present

## 2023-09-29 DIAGNOSIS — D1801 Hemangioma of skin and subcutaneous tissue: Secondary | ICD-10-CM | POA: Diagnosis not present

## 2023-09-29 DIAGNOSIS — L820 Inflamed seborrheic keratosis: Secondary | ICD-10-CM | POA: Diagnosis not present

## 2023-10-04 NOTE — Progress Notes (Signed)
HPI: FU CAD and atrial fibrillation. He previously lived in [place]; S/P coronary artery bypass and graft approximately 15 years ago. Also with history of permanent atrial fibrillation/flutter and has had previous pacemaker.  Echocardiogram December 2021 showed normal LV function, severe left atrial enlargement, mild right atrial enlargement, mild mitral regurgitation.  Abdominal ultrasound August 2024 showed no aneurysm.  Since last seen   Current Outpatient Medications  Medication Sig Dispense Refill   apixaban (ELIQUIS) 5 MG TABS tablet Take 1 tablet (5 mg total) by mouth 2 (two) times daily. 180 tablet 1   Ascorbic Acid (VITAMIN C) 1000 MG tablet Take 1,000 mg by mouth 4 (four) times a week.     atorvastatin (LIPITOR) 20 MG tablet Take 1 tablet by mouth once daily 90 tablet 1   b complex vitamins capsule Take 1 capsule by mouth daily.     Coenzyme Q10 (CO Q 10) 100 MG CAPS Take 100 mg by mouth daily.     docusate sodium (COLACE) 100 MG capsule Take 100 mg by mouth 2 (two) times daily.     furosemide (LASIX) 40 MG tablet Take 1 tablet by mouth once daily 90 tablet 1   irbesartan (AVAPRO) 150 MG tablet Take 1 tablet (150 mg total) by mouth daily. 30 tablet 11   Melatonin 10 MG CAPS Take 10 mg by mouth at bedtime.     Multiple Vitamin (MULTIVITAMIN) tablet Take 1 tablet by mouth daily.     potassium chloride (KLOR-CON) 10 MEQ tablet Take 1 tablet by mouth once daily 90 tablet 1   tamsulosin (FLOMAX) 0.4 MG CAPS capsule Take 1 capsule by mouth once daily 90 capsule 1   No current facility-administered medications for this visit.     Past Medical History:  Diagnosis Date   Arthritis 2010   Diagnosed but not enough to treat with medication.   Atrial fibrillation (HCC)    Atrial fibrillation (HCC)    CAD (coronary artery disease)    Cancer (HCC)    Cataract 2009   surgery both eyes   Chronic kidney disease    GERD (gastroesophageal reflux disease) 2000   Hyperlipidemia     Hypertension    OSA (obstructive sleep apnea)    Pacemaker    Medtronic-   Skin cancer    Sleep apnea 1990   c-pap machine   Status post dilation of esophageal narrowing    Well adult exam 01/20/2023    Past Surgical History:  Procedure Laterality Date   COLONOSCOPY     CORONARY ARTERY BYPASS GRAFT     November 2003   EYE SURGERY  1985   RK surgery   JOINT REPLACEMENT  June 23 2022   Rt knee replacement   SPINE SURGERY  2014   L4,L5 fusion   TONSILLECTOMY     TOTAL KNEE ARTHROPLASTY Right 06/23/2022   Procedure: RIGHT TOTAL KNEE ARTHROPLASTY;  Surgeon: Cammy Copa, MD;  Location: [facility] OR;  Service: Orthopedics;  Laterality: Right;   UPPER GASTROINTESTINAL ENDOSCOPY      Social History   Socioeconomic History   Marital status: Widowed    Spouse name: Not on file   Number of children: 3   Years of education: 16   Highest education level: Bachelor's degree (e.g., BA, AB, BS)  Occupational History   Occupation: Retired.   Occupation: retired  Tobacco Use   Smoking status: Former    Current packs/day: 0.00    Average packs/day: 1 pack/day  for 20.0 years (20.0 ttl pk-yrs)    Types: Cigarettes    Start date: 11/30/1953    Quit date: 11/30/1973    Years since quitting: 49.9   Smokeless tobacco: Never  Vaping Use   Vaping status: Never Used  Substance and Sexual Activity   Alcohol use: Yes    Alcohol/week: 2.0 standard drinks of alcohol    Types: 2 Cans of beer per week    Comment: a beer 3-4 a week   Drug use: Never   Sexual activity: Not Currently  Other Topics Concern   Not on file  Social History Narrative   Lives alone. He has three children. He enjoys traveling.   Social Determinants of Health   Financial Resource Strain: Low Risk  (09/10/2023)   Overall Financial Resource Strain (CARDIA)    Difficulty of Paying Living Expenses: Not hard at all  Food Insecurity: No Food Insecurity (09/10/2023)   Hunger Vital Sign    Worried About Running Out of Food  in the Last Year: Never true    Ran Out of Food in the Last Year: Never true  Transportation Needs: No Transportation Needs (09/10/2023)   PRAPARE - Administrator, Civil Service (Medical): No    Lack of Transportation (Non-Medical): No  Physical Activity: Insufficiently Active (09/10/2023)   Exercise Vital Sign    Days of Exercise per Week: 3 days    Minutes of Exercise per Session: 30 min  Stress: No Stress Concern Present (09/10/2023)   [employer] of Occupational Health - Occupational Stress Questionnaire    Feeling of Stress : Not at all  Social Connections: Moderately Integrated (09/10/2023)   Social Connection and Isolation Panel [NHANES]    Frequency of Communication with Friends and Family: More than three times a week    Frequency of Social Gatherings with Friends and Family: More than three times a week    Attends Religious Services: More than 4 times per year    Active Member of [employer] or Organizations: Yes    Attends Banker Meetings: More than 4 times per year    Marital Status: Widowed  Intimate Partner Violence: Not At Risk (01/18/2023)   Humiliation, Afraid, Rape, and Kick questionnaire    Fear of Current or Ex-Partner: No    Emotionally Abused: No    Physically Abused: No    Sexually Abused: No    Family History  Problem Relation Age of Onset   Breast cancer Mother    Cancer Mother    Heart failure Father    Heart disease Father    Healthy Sister    Diabetes Paternal Grandmother    Heart failure Paternal Grandfather    Heart disease Paternal Grandfather    Stomach cancer Neg Hx    Esophageal cancer Neg Hx    Colon cancer Neg Hx    Rectal cancer Neg Hx     ROS: no fevers or chills, productive cough, hemoptysis, dysphasia, odynophagia, melena, hematochezia, dysuria, hematuria, rash, seizure activity, orthopnea, PND, pedal edema, claudication. Remaining systems are negative.  Physical Exam: Well-developed well-nourished in no  acute distress.  Skin is warm and dry.  HEENT is normal.  Neck is supple.  Chest is clear to auscultation with normal expansion.  Cardiovascular exam is regular rate and rhythm.  Abdominal exam nontender or distended. No masses palpated. Extremities show no edema. neuro grossly intact  ECG- personally reviewed  A/P  1 coronary artery disease status post coronary bypass and  graft-patient remains asymptomatic.  Continue statin.  No aspirin given need for anticoagulation.  2 permanent atrial fibrillation-continue apixaban.  3 history of pacemaker-followed by electrophysiology.  4 hypertension-patient's blood pressure is controlled.  Continue present medications.  5 hyperlipidemia-continue statin.  6 lower extremity edema-continue diuretic at present dose.  Olga Millers, MD

## 2023-10-10 ENCOUNTER — Other Ambulatory Visit: Payer: Self-pay | Admitting: Family Medicine

## 2023-10-18 ENCOUNTER — Encounter: Payer: Self-pay | Admitting: Cardiology

## 2023-10-18 ENCOUNTER — Ambulatory Visit: Payer: Medicare Other | Admitting: Cardiology

## 2023-10-18 VITALS — BP 138/64 | HR 62 | Ht 70.0 in | Wt 225.8 lb

## 2023-10-18 DIAGNOSIS — E7800 Pure hypercholesterolemia, unspecified: Secondary | ICD-10-CM | POA: Diagnosis not present

## 2023-10-18 DIAGNOSIS — I10 Essential (primary) hypertension: Secondary | ICD-10-CM

## 2023-10-18 DIAGNOSIS — I2510 Atherosclerotic heart disease of native coronary artery without angina pectoris: Secondary | ICD-10-CM | POA: Diagnosis not present

## 2023-10-18 DIAGNOSIS — I4821 Permanent atrial fibrillation: Secondary | ICD-10-CM | POA: Diagnosis not present

## 2023-10-18 NOTE — Patient Instructions (Signed)

## 2023-10-20 ENCOUNTER — Telehealth: Payer: Self-pay | Admitting: Cardiology

## 2023-10-20 NOTE — Telephone Encounter (Signed)
Paper Work Dropped Off:  Air traffic controller form  Date:10/20/23  Location of paper:  [facility]

## 2023-10-21 NOTE — Telephone Encounter (Signed)
Left message for patient, paperwork has been faxed to the company.

## 2023-10-27 ENCOUNTER — Other Ambulatory Visit: Payer: Self-pay | Admitting: *Deleted

## 2023-10-27 ENCOUNTER — Other Ambulatory Visit: Payer: Self-pay | Admitting: Medical Genetics

## 2023-10-27 DIAGNOSIS — I4892 Unspecified atrial flutter: Secondary | ICD-10-CM

## 2023-10-27 MED ORDER — APIXABAN 5 MG PO TABS
5.0000 mg | ORAL_TABLET | Freq: Two times a day (BID) | ORAL | 1 refills | Status: DC
Start: 2023-10-27 — End: 2024-04-03

## 2023-10-27 NOTE — Telephone Encounter (Signed)
Eliquis 5mg  refill request received from [facility]. Patient is 89 years old, weight-102.4kg, Crea-1.34 on 07/21/23, Diagnosis-AFIB, and last seen by Dr. Jens Som on 10/18/23. Dose is appropriate based on dosing criteria. Will send in refill to requested pharmacy.

## 2023-11-03 ENCOUNTER — Other Ambulatory Visit: Payer: Self-pay | Admitting: Medical Genetics

## 2023-11-25 ENCOUNTER — Other Ambulatory Visit (HOSPITAL_COMMUNITY): Payer: Medicare Other

## 2023-12-03 ENCOUNTER — Other Ambulatory Visit (HOSPITAL_COMMUNITY)
Admission: RE | Admit: 2023-12-03 | Discharge: 2023-12-03 | Disposition: A | Discharge status: Home / Self Care | Payer: Self-pay | Source: Ambulatory Visit | Attending: Oncology | Admitting: Oncology

## 2023-12-07 ENCOUNTER — Ambulatory Visit (INDEPENDENT_AMBULATORY_CARE_PROVIDER_SITE_OTHER): Payer: Medicare Other

## 2023-12-07 DIAGNOSIS — I4821 Permanent atrial fibrillation: Secondary | ICD-10-CM | POA: Diagnosis not present

## 2023-12-09 LAB — CUP PACEART REMOTE DEVICE CHECK
Battery Remaining Longevity: 96 mo
Battery Voltage: 3 V
Brady Statistic RV Percent Paced: 60.66 %
Date Time Interrogation Session: 20250108150100
Implantable Pulse Generator Implant Date: 20210128
Lead Channel Impedance Value: 520 Ohm
Lead Channel Pacing Threshold Amplitude: 0.375 V
Lead Channel Pacing Threshold Pulse Width: 0.24 ms
Lead Channel Sensing Intrinsic Amplitude: 8.888 mV
Lead Channel Setting Pacing Amplitude: 0.875
Lead Channel Setting Pacing Pulse Width: 0.24 ms
Lead Channel Setting Sensing Sensitivity: 2 mV

## 2023-12-13 LAB — GENECONNECT MOLECULAR SCREEN: Genetic Analysis Overall Interpretation: NEGATIVE

## 2023-12-27 DIAGNOSIS — K080 Exfoliation of teeth due to systemic causes: Secondary | ICD-10-CM | POA: Diagnosis not present

## 2024-01-05 DIAGNOSIS — K080 Exfoliation of teeth due to systemic causes: Secondary | ICD-10-CM | POA: Diagnosis not present

## 2024-01-20 NOTE — Addendum Note (Signed)
 Addended by: Geralyn Flash D on: 01/20/2024 10:58 AM   Modules accepted: Orders

## 2024-01-20 NOTE — Progress Notes (Signed)
 Remote pacemaker transmission.

## 2024-01-21 ENCOUNTER — Ambulatory Visit (INDEPENDENT_AMBULATORY_CARE_PROVIDER_SITE_OTHER): Payer: Medicare Other | Admitting: Family Medicine

## 2024-01-21 ENCOUNTER — Encounter: Payer: Self-pay | Admitting: Family Medicine

## 2024-01-21 VITALS — BP 119/64 | HR 67 | Ht 70.0 in | Wt 223.0 lb

## 2024-01-21 DIAGNOSIS — I10 Essential (primary) hypertension: Secondary | ICD-10-CM | POA: Diagnosis not present

## 2024-01-21 DIAGNOSIS — I480 Paroxysmal atrial fibrillation: Secondary | ICD-10-CM | POA: Diagnosis not present

## 2024-01-21 DIAGNOSIS — N400 Enlarged prostate without lower urinary tract symptoms: Secondary | ICD-10-CM

## 2024-01-21 DIAGNOSIS — E7800 Pure hypercholesterolemia, unspecified: Secondary | ICD-10-CM | POA: Diagnosis not present

## 2024-01-21 MED ORDER — AMOXICILLIN 500 MG PO TABS: ORAL_TABLET | ORAL | 0 refills | Status: DC

## 2024-01-21 NOTE — Assessment & Plan Note (Signed)
Doing well with atorvastatin.  Will plan to continue at current strength.

## 2024-01-21 NOTE — Assessment & Plan Note (Signed)
Stable symptoms with flomax.  Continue at current strength.

## 2024-01-21 NOTE — Assessment & Plan Note (Signed)
 BP remains fairly well controlled for age and co-morbidities.  Will continue current medications for management of HTN.

## 2024-01-21 NOTE — Progress Notes (Signed)
 Cassidy Tabet - 89 y.o. male MRN [id number]  Date of birth: Aug 19, 1934  Subjective Chief Complaint  Patient presents with   Hypertension    HPI Stiles Maxcy is a 89 y.o. male here today for follow up visit.   He reports that he is doing well at this time. Continues to see cardiology for management of A. Fib.  Stable and remains on eliquis for anticoagulation.  BP remains well controlled with irbesartan.  Denies new chest pain, shortness of breath, .increased palpitations, dizziness or fatigue.    Using flomax for BPH symptoms.  Well controlled.   Tolerating atorvastatin for HLD.   ROS:  A comprehensive ROS was completed and negative except as noted per HPI  No Known Allergies  Past Medical History:  Diagnosis Date   Arthritis 2010   Diagnosed but not enough to treat with medication.   Atrial fibrillation (HCC)    Atrial fibrillation (HCC)    CAD (coronary artery disease)    Cancer (HCC)    Cataract 2009   surgery both eyes   Chronic kidney disease    GERD (gastroesophageal reflux disease) 2000   Hyperlipidemia    Hypertension    OSA (obstructive sleep apnea)    Pacemaker    Medtronic-   Skin cancer    Sleep apnea 1990   c-pap machine   Status post dilation of esophageal narrowing    Well adult exam 01/20/2023    Past Surgical History:  Procedure Laterality Date   COLONOSCOPY     CORONARY ARTERY BYPASS GRAFT     November 2003   EYE SURGERY  1985   RK surgery   JOINT REPLACEMENT  June 23 2022   Rt knee replacement   SPINE SURGERY  2014   L4,L5 fusion   TONSILLECTOMY     TOTAL KNEE ARTHROPLASTY Right 06/23/2022   Procedure: RIGHT TOTAL KNEE ARTHROPLASTY;  Surgeon: Cammy Copa, MD;  Location: [facility] OR;  Service: Orthopedics;  Laterality: Right;   UPPER GASTROINTESTINAL ENDOSCOPY      Social History   Socioeconomic History   Marital status: Widowed    Spouse name: Not on file   Number of children: 3   Years of education: 16   Highest education  level: Bachelor's degree (e.g., BA, AB, BS)  Occupational History   Occupation: Retired.   Occupation: retired  Tobacco Use   Smoking status: Former    Current packs/day: 0.00    Average packs/day: 1 pack/day for 20.0 years (20.0 ttl pk-yrs)    Types: Cigarettes    Start date: 11/30/1953    Quit date: 11/30/1973    Years since quitting: 50.1   Smokeless tobacco: Never  Vaping Use   Vaping status: Never Used  Substance and Sexual Activity   Alcohol use: Yes    Alcohol/week: 2.0 standard drinks of alcohol    Types: 2 Cans of beer per week    Comment: a beer 3-4 a week   Drug use: Never   Sexual activity: Not Currently  Other Topics Concern   Not on file  Social History Narrative   Lives alone. He has three children. He enjoys traveling.   Social Drivers of Corporate investment banker Strain: Low Risk  (01/17/2024)   Overall Financial Resource Strain (CARDIA)    Difficulty of Paying Living Expenses: Not hard at all  Food Insecurity: No Food Insecurity (01/17/2024)   Hunger Vital Sign    Worried About Running Out of Food in the Last  Year: Never true    Ran Out of Food in the Last Year: Never true  Transportation Needs: No Transportation Needs (01/17/2024)   PRAPARE - Administrator, Civil Service (Medical): No    Lack of Transportation (Non-Medical): No  Physical Activity: Sufficiently Active (01/17/2024)   Exercise Vital Sign    Days of Exercise per Week: 7 days    Minutes of Exercise per Session: 60 min  Stress: No Stress Concern Present (01/17/2024)   [employer] of Occupational Health - Occupational Stress Questionnaire    Feeling of Stress : Not at all  Social Connections: Moderately Integrated (01/17/2024)   Social Connection and Isolation Panel [NHANES]    Frequency of Communication with Friends and Family: More than three times a week    Frequency of Social Gatherings with Friends and Family: More than three times a week    Attends Religious Services:  More than 4 times per year    Active Member of [employer] or Organizations: Yes    Attends Banker Meetings: More than 4 times per year    Marital Status: Widowed    Family History  Problem Relation Age of Onset   Breast cancer Mother    Cancer Mother    Heart failure Father    Heart disease Father    Healthy Sister    Diabetes Paternal Grandmother    Heart failure Paternal Grandfather    Heart disease Paternal Grandfather    Stomach cancer Neg Hx    Esophageal cancer Neg Hx    Colon cancer Neg Hx    Rectal cancer Neg Hx     Health Maintenance  Topic Date Due   Medicare Annual Wellness (AWV)  01/19/2024   DTaP/Tdap/Td (3 - Td or Tdap) 02/06/2030   Pneumonia Vaccine 62+ Years old  Completed   INFLUENZA VACCINE  Completed   COVID-19 Vaccine  Completed   HPV VACCINES  Aged Out   Zoster Vaccines- Shingrix  Discontinued     ----------------------------------------------------------------------------------------------------------------------------------------------------------------------------------------------------------------- Physical Exam BP (!) 144/71 (BP Location: Left Arm, Patient Position: Sitting, Cuff Size: Normal)   Pulse 67   Ht 5\' 10"  (1.778 m)   Wt 223 lb (101.2 kg)   SpO2 98%   BMI 32.00 kg/m   Physical Exam Constitutional:      Appearance: Normal appearance.  HENT:     Head: Normocephalic and atraumatic.  Eyes:     General: No scleral icterus. Cardiovascular:     Rate and Rhythm: Normal rate and regular rhythm.  Pulmonary:     Effort: Pulmonary effort is normal.     Breath sounds: Normal breath sounds.  Neurological:     Mental Status: He is alert.  Psychiatric:        Mood and Affect: Mood normal.        Behavior: Behavior normal.      ------------------------------------------------------------------------------------------------------------------------------------------------------------------------------------------------------------------- Assessment and Plan  Paroxysmal atrial fibrillation (HCC) Stable at this time.  Management per cardiology.  Remains on anticoagulation with eliquis.    Hypertension BP remains fairly well controlled for age and co-morbidities.  Will continue current medications for management of HTN.    Hyperlipidemia Doing well with atorvastatin.  Will plan to continue at current strength.   BPH (benign prostatic hyperplasia) Stable symptoms with flomax.  Continue at current strength.     Meds ordered this encounter  Medications   amoxicillin (AMOXIL) 500 MG tablet    Sig: Take 2g 1 hour prior to dental procedure.  Dispense:  10 tablet    Refill:  0    No follow-ups on file.    This visit occurred during the SARS-CoV-2 public health emergency.  Safety protocols were in place, including screening questions prior to the visit, additional usage of staff PPE, and extensive cleaning of exam room while observing appropriate contact time as indicated for disinfecting solutions.

## 2024-01-21 NOTE — Assessment & Plan Note (Signed)
Stable at this time.  Management per cardiology.  Remains on anticoagulation with eliquis.

## 2024-01-22 LAB — CBC WITH DIFFERENTIAL/PLATELET
Basophils Absolute: 0 10*3/uL (ref 0.0–0.2)
Basos: 1 %
EOS (ABSOLUTE): 0.1 10*3/uL (ref 0.0–0.4)
Eos: 3 %
Hematocrit: 40.7 % (ref 37.5–51.0)
Hemoglobin: 14.2 g/dL (ref 13.0–17.7)
Immature Grans (Abs): 0 10*3/uL (ref 0.0–0.1)
Immature Granulocytes: 0 %
Lymphocytes Absolute: 1.3 10*3/uL (ref 0.7–3.1)
Lymphs: 25 %
MCH: 35.3 pg — ABNORMAL HIGH (ref 26.6–33.0)
MCHC: 34.9 g/dL (ref 31.5–35.7)
MCV: 101 fL — ABNORMAL HIGH (ref 79–97)
Monocytes Absolute: 0.6 10*3/uL (ref 0.1–0.9)
Monocytes: 11 %
Neutrophils Absolute: 3.1 10*3/uL (ref 1.4–7.0)
Neutrophils: 60 %
Platelets: 172 10*3/uL (ref 150–450)
RBC: 4.02 x10E6/uL — ABNORMAL LOW (ref 4.14–5.80)
RDW: 12.3 % (ref 11.6–15.4)
WBC: 5.2 10*3/uL (ref 3.4–10.8)

## 2024-01-22 LAB — LIPID PANEL WITH LDL/HDL RATIO
Cholesterol, Total: 112 mg/dL (ref 100–199)
HDL: 38 mg/dL — ABNORMAL LOW (ref >39)
LDL Chol Calc (NIH): 54 mg/dL (ref 0–99)
LDL/HDL Ratio: 1.4 ratio (ref 0.0–3.6)
Triglycerides: 108 mg/dL (ref 0–149)
VLDL Cholesterol Cal: 20 mg/dL (ref 5–40)

## 2024-01-22 LAB — CMP14+EGFR
ALT: 24 IU/L (ref 0–44)
AST: 27 IU/L (ref 0–40)
Albumin: 4.4 g/dL (ref 3.7–4.7)
Alkaline Phosphatase: 74 IU/L (ref 44–121)
BUN/Creatinine Ratio: 16 (ref 10–24)
BUN: 20 mg/dL (ref 8–27)
Bilirubin Total: 1.6 mg/dL — ABNORMAL HIGH (ref 0.0–1.2)
CO2: 23 mmol/L (ref 20–29)
Calcium: 9 mg/dL (ref 8.6–10.2)
Chloride: 105 mmol/L (ref 96–106)
Creatinine, Ser: 1.22 mg/dL (ref 0.76–1.27)
Globulin, Total: 2.1 g/dL (ref 1.5–4.5)
Glucose: 98 mg/dL (ref 70–99)
Potassium: 3.8 mmol/L (ref 3.5–5.2)
Sodium: 141 mmol/L (ref 134–144)
Total Protein: 6.5 g/dL (ref 6.0–8.5)
eGFR: 57 mL/min/{1.73_m2} — ABNORMAL LOW (ref >59)

## 2024-01-22 LAB — PSA: Prostate Specific Ag, Serum: 0.6 ng/mL (ref 0.0–4.0)

## 2024-01-26 ENCOUNTER — Ambulatory Visit: Payer: Medicare Other

## 2024-01-26 VITALS — Ht 70.0 in | Wt 216.0 lb

## 2024-01-26 DIAGNOSIS — Z0000 Encounter for general adult medical examination without abnormal findings: Secondary | ICD-10-CM

## 2024-01-26 NOTE — Progress Notes (Signed)
 Subjective:   Harry Kline is a 89 y.o. male who presents for Medicare Annual/Subsequent preventive examination.  Visit Complete: Virtual I connected with  Harry Kline on 01/26/24 by a audio enabled telemedicine application and verified that I am speaking with the correct person using two identifiers.  Patient Location: Home  Provider Location: Office/Clinic  I discussed the limitations of evaluation and management by telemedicine. The patient expressed understanding and agreed to proceed.  Vital Signs: Because this visit was a virtual/telehealth visit, some criteria may be missing or patient reported. Any vitals not documented were not able to be obtained and vitals that have been documented are patient reported.  Patient Medicare AWV questionnaire was completed by the patient on 01/17/2024; I have confirmed that all information answered by patient is correct and no changes since this date.  Cardiac Risk Factors include: smoking/ tobacco exposure;male gender;advanced age (>39men, >33 women);obesity (BMI >30kg/m2);family history of premature cardiovascular disease;dyslipidemia;hypertension     Objective:    Today's Vitals   01/26/24 1044  Weight: 216 lb (98 kg)  Height: 5\' 10"  (1.778 m)   Body mass index is 30.99 kg/m.     01/26/2024   10:55 AM 01/18/2023    9:28 AM 06/23/2022    6:16 PM 06/17/2022    8:55 AM 05/12/2022    3:17 PM 01/14/2022    9:32 AM 07/10/2021    3:39 PM  Advanced Directives  Does Patient Have a Medical Advance Directive? Yes Yes Yes Yes Yes Yes Yes  Type of Estate agent of [place];Living will Living will Healthcare Power of [employer] Power of [place];Living will Living will;Healthcare Power of Attorney Living will;Healthcare Power of [employer] Power of [place];Living will;Out of facility DNR (pink MOST or yellow form)  Does patient want to make changes to medical advance directive? No - Patient declined No -  Patient declined No - Patient declined   No - Patient declined   Copy of Healthcare Power of Attorney in Chart? No - copy requested  No - copy requested No - copy requested No - copy requested No - copy requested No - copy requested  Would patient like information on creating a medical advance directive?     No - Patient declined      Current Medications (verified) Outpatient Encounter Medications as of 01/26/2024  Medication Sig   amoxicillin (AMOXIL) 500 MG tablet Take 2g 1 hour prior to dental procedure.   apixaban (ELIQUIS) 5 MG TABS tablet Take 1 tablet (5 mg total) by mouth 2 (two) times daily.   Ascorbic Acid (VITAMIN C) 1000 MG tablet Take 1,000 mg by mouth 4 (four) times a week.   atorvastatin (LIPITOR) 20 MG tablet Take 1 tablet by mouth once daily   b complex vitamins capsule Take 1 capsule by mouth daily.   Coenzyme Q10 (CO Q 10) 100 MG CAPS Take 100 mg by mouth daily.   docusate sodium (COLACE) 100 MG capsule Take 100 mg by mouth 2 (two) times daily.   furosemide (LASIX) 40 MG tablet Take 1 tablet by mouth once daily   irbesartan (AVAPRO) 150 MG tablet Take 1 tablet (150 mg total) by mouth daily.   Melatonin 10 MG CAPS Take 10 mg by mouth at bedtime.   Multiple Vitamin (MULTIVITAMIN) tablet Take 1 tablet by mouth daily.   potassium chloride (KLOR-CON) 10 MEQ tablet Take 1 tablet by mouth once daily   tamsulosin (FLOMAX) 0.4 MG CAPS capsule Take 1 capsule by mouth  once daily   No facility-administered encounter medications on file as of 01/26/2024.    Allergies (verified) Patient has no known allergies.   History: Past Medical History:  Diagnosis Date   Arthritis 2010   Diagnosed but not enough to treat with medication.   Atrial fibrillation (HCC)    Atrial fibrillation (HCC)    CAD (coronary artery disease)    Cancer (HCC)    Cataract 2009   surgery both eyes   Chronic kidney disease    GERD (gastroesophageal reflux disease) 2000   Hyperlipidemia    Hypertension     OSA (obstructive sleep apnea)    Pacemaker    Medtronic-   Skin cancer    Sleep apnea 1990   c-pap machine   Status post dilation of esophageal narrowing    Well adult exam 01/20/2023   Past Surgical History:  Procedure Laterality Date   COLONOSCOPY     CORONARY ARTERY BYPASS GRAFT     November 2003   EYE SURGERY  1985   RK surgery   JOINT REPLACEMENT  June 23 2022   Rt knee replacement   SPINE SURGERY  2014   L4,L5 fusion   TONSILLECTOMY     TOTAL KNEE ARTHROPLASTY Right 06/23/2022   Procedure: RIGHT TOTAL KNEE ARTHROPLASTY;  Surgeon: Cammy Copa, MD;  Location: [facility] OR;  Service: Orthopedics;  Laterality: Right;   UPPER GASTROINTESTINAL ENDOSCOPY     Family History  Problem Relation Age of Onset   Breast cancer Mother    Cancer Mother    Heart failure Father    Heart disease Father    Healthy Sister    Diabetes Paternal Grandmother    Heart failure Paternal Grandfather    Heart disease Paternal Grandfather    Stomach cancer Neg Hx    Esophageal cancer Neg Hx    Colon cancer Neg Hx    Rectal cancer Neg Hx    Social History   Socioeconomic History   Marital status: Widowed    Spouse name: Not on file   Number of children: 3   Years of education: 16   Highest education level: Bachelor's degree (e.g., BA, AB, BS)  Occupational History   Occupation: Retired.   Occupation: retired  Tobacco Use   Smoking status: Former    Current packs/day: 0.00    Average packs/day: 1 pack/day for 20.0 years (20.0 ttl pk-yrs)    Types: Cigarettes    Start date: 11/30/1953    Quit date: 11/30/1973    Years since quitting: 50.1   Smokeless tobacco: Never  Vaping Use   Vaping status: Never Used  Substance and Sexual Activity   Alcohol use: Yes    Alcohol/week: 2.0 standard drinks of alcohol    Types: 2 Cans of beer per week    Comment: a beer 3-4 a week   Drug use: Never   Sexual activity: Not Currently  Other Topics Concern   Not on file  Social History Narrative    His grandson is staying with him. He has three children. He enjoys traveling.   Social Drivers of Corporate investment banker Strain: Low Risk  (01/26/2024)   Overall Financial Resource Strain (CARDIA)    Difficulty of Paying Living Expenses: Not hard at all  Food Insecurity: No Food Insecurity (01/26/2024)   Hunger Vital Sign    Worried About Running Out of Food in the Last Year: Never true    Ran Out of Food in the Last  Year: Never true  Transportation Needs: No Transportation Needs (01/26/2024)   PRAPARE - Administrator, Civil Service (Medical): No    Lack of Transportation (Non-Medical): No  Physical Activity: Sufficiently Active (01/17/2024)   Exercise Vital Sign    Days of Exercise per Week: 7 days    Minutes of Exercise per Session: 60 min  Stress: No Stress Concern Present (01/26/2024)   Harry Kline of Occupational Health - Occupational Stress Questionnaire    Feeling of Stress : Not at all  Social Connections: Moderately Integrated (01/26/2024)   Social Connection and Isolation Panel [NHANES]    Frequency of Communication with Friends and Family: More than three times a week    Frequency of Social Gatherings with Friends and Family: More than three times a week    Attends Religious Services: More than 4 times per year    Active Member of [employer] or Organizations: Yes    Attends Banker Meetings: More than 4 times per year    Marital Status: Widowed    Tobacco Counseling Counseling given: Not Answered   Clinical Intake:  Pre-visit preparation completed: Yes  Pain : No/denies pain     BMI - recorded: 30.99 Nutritional Status: BMI > 30  Obese Nutritional Risks: None, Other (Comment) Diabetes: No  How often do you need to have someone help you when you read instructions, pamphlets, or other written materials from your doctor or pharmacy?: 1 - Never What is the last grade level you completed in school?: 16  Interpreter Needed?: No       Activities of Daily Living    01/26/2024   10:46 AM 01/24/2024    7:37 AM  In your present state of health, do you have any difficulty performing the following activities:  Hearing? 0 0  Vision? 0 0  Difficulty concentrating or making decisions? 0 0  Walking or climbing stairs? 0 0  Dressing or bathing? 0 0  Doing errands, shopping? 0 0  Preparing Food and eating ? N N  Using the Toilet? N N  In the past six months, have you accidently leaked urine? N N  Do you have problems with loss of bowel control? N N  Managing your Medications? N N  Managing your Finances? N N  Housekeeping or managing your Housekeeping? N N    Patient Care Team: Everrett Coombe, DO as PCP - General (Family Medicine) Regan Lemming, MD as PCP - Electrophysiology (Cardiology) Jens Som Madolyn Frieze, MD as PCP - Cardiology (Cardiology) Jens Som Madolyn Frieze, MD as Consulting Physician (Cardiology) Reginia Naas, MD as Referring Physician (Dermatology)  Indicate any recent Medical Services you may have received from other than [facility] providers in the past year (date may be approximate).     Assessment:   This is a routine wellness examination for Harry Kline.  Hearing/Vision screen No results found.   Goals Addressed             This Visit's Progress    Social and Functional Skills Optimized       He would like to take more day trips with his family.       Depression Screen    01/26/2024   10:54 AM 01/21/2024    9:25 AM 01/18/2023    9:33 AM 01/18/2023    9:31 AM 07/20/2022    9:42 AM 01/14/2022    9:34 AM 07/10/2021    3:33 PM  PHQ 2/9 Scores  PHQ - 2 Score 0 0  0 0 0 0 0  PHQ- 9 Score      0 1    Fall Risk    01/26/2024   10:55 AM 01/24/2024    7:37 AM 01/21/2024    9:25 AM 01/19/2024    5:40 PM 01/18/2023    9:24 AM  Fall Risk   Falls in the past year? 0 0 0 0 0  Number falls in past yr: 0  0  0  Injury with Fall? 0  0  0  Risk for fall due to : No Fall Risks  No Fall Risks  No Fall Risks   Follow up Falls evaluation completed  Falls evaluation completed  Falls evaluation completed    MEDICARE RISK AT HOME: Medicare Risk at Home Any stairs in or around the home?: No Home free of loose throw rugs in walkways, pet beds, electrical cords, etc?: Yes Adequate lighting in your home to reduce risk of falls?: Yes Life alert?: No Use of a cane, walker or w/c?: No Grab bars in the bathroom?: Yes Shower chair or bench in shower?: No Elevated toilet seat or a handicapped toilet?: No  TIMED UP AND GO:  Was the test performed?  No    Cognitive Function:        01/26/2024   10:56 AM 01/18/2023    9:31 AM 01/14/2022    9:33 AM  6CIT Screen  What Year? 0 points 0 points 0 points  What month? 0 points 0 points 0 points  What time? 0 points 0 points 0 points  Count back from 20 0 points 0 points 0 points  Months in reverse 0 points 0 points 0 points  Repeat phrase 0 points 0 points 0 points  Total Score 0 points 0 points 0 points    Immunizations Immunization History  Administered Date(s) Administered   DTaP 02/07/2020   Fluad Quad(high Dose 65+) 09/12/2021, 09/03/2022   Fluad Trivalent(High Dose 65+) 10/16/2020, 07/21/2023, 08/05/2023   Influenza-Unspecified 08/15/2019, 10/16/2020, 07/21/2023   Moderna Sars-Covid-2 Vaccination 12/22/2019, 01/21/2020, 12/02/2020   PNEUMOCOCCAL CONJUGATE-20 01/20/2022   Pfizer Covid-19 Vaccine Bivalent Booster 5y-11y 10/12/2021   Pfizer(Comirnaty)Fall Seasonal Vaccine 12 years and older 08/05/2023   Tdap 02/07/2020   Zoster, Live 08/18/2014    TDAP status: Up to date  Flu Vaccine status: Up to date  Pneumococcal vaccine status: Up to date  Covid-19 vaccine status: Information provided on how to obtain vaccines.   Qualifies for Shingles Vaccine? Yes   Zostavax completed No   Shingrix Completed?: No.    Education has been provided regarding the importance of this vaccine. Patient has been advised to call insurance company to  determine out of pocket expense if they have not yet received this vaccine. Advised may also receive vaccine at local pharmacy or Health Dept. Verbalized acceptance and understanding.  Screening Tests Health Maintenance  Topic Date Due   Medicare Annual Wellness (AWV)  01/25/2025   DTaP/Tdap/Td (3 - Td or Tdap) 02/06/2030   Pneumonia Vaccine 30+ Years old  Completed   INFLUENZA VACCINE  Completed   COVID-19 Vaccine  Completed   HPV VACCINES  Aged Out   Zoster Vaccines- Shingrix  Discontinued    Health Maintenance  There are no preventive care reminders to display for this patient.   Colorectal cancer screening: No longer required.   Lung Cancer Screening: (Low Dose CT Chest recommended if Age 46-80 years, 20 pack-year currently smoking OR have quit w/in 15years.) does not qualify.  Lung Cancer Screening Referral: n/a  Additional Screening:  Hepatitis C Screening: does not qualify; Completed n/a  Vision Screening: Recommended annual ophthalmology exams for early detection of glaucoma and other disorders of the eye. Is the patient up to date with their annual eye exam?  Yes  Who is the provider or what is the name of the office in which the patient attends annual eye exams? Dr Jordan Likes  If pt is not established with a provider, would they like to be referred to a provider to establish care?  N/a .   Dental Screening: Recommended annual dental exams for proper oral hygiene   Community Resource Referral / Chronic Care Management: CRR required this visit?  No   CCM required this visit?  No     Plan:     I have personally reviewed and noted the following in the patient's chart:   Medical and social history Use of alcohol, tobacco or illicit drugs  Current medications and supplements including opioid prescriptions. Patient is not currently taking opioid prescriptions. Functional ability and status Nutritional status Physical activity Advanced directives List of other  physicians Hospitalizations, surgeries, and ER visits in previous 12 months Vitals Screenings to include cognitive, depression, and falls Referrals and appointments  In addition, I have reviewed and discussed with patient certain preventive protocols, quality metrics, and best practice recommendations. A written personalized care plan for preventive services as well as general preventive health recommendations were provided to patient.     Harry Kline, [place]   01/26/2024   After Visit Summary: (MyChart) Due to this being a telephonic visit, the after visit summary with patients personalized plan was offered to patient via MyChart   Nurse Notes:   Harry Kline is a 89 y.o. male patient of Everrett Coombe, DO who had a Medicare Annual Wellness Visit today via telephone. Harry Kline is Retired. His grandson it living with him at the moment. He has 3 children. He reports that he is socially active and does interact with friends/family regularly. he is moderately physically active and enjoys traveling.

## 2024-01-26 NOTE — Patient Instructions (Signed)
  Mr. Keltz , Thank you for taking time to come for your Medicare Wellness Visit. I appreciate your ongoing commitment to your health goals. Please review the following plan we discussed and let me know if I can assist you in the future.   These are the goals we discussed:  Goals       Patient Stated (pt-stated)      Would like to loose 20 lbs       Patient Stated (pt-stated)      Patient stated that he would like to be able to walk without being in any pain.      Social and Functional Skills Optimized      He would like to take more day trips with his family.         This is a list of the screening recommended for you and due dates:  Health Maintenance  Topic Date Due   Medicare Annual Wellness Visit  01/25/2025   DTaP/Tdap/Td vaccine (3 - Td or Tdap) 02/06/2030   Pneumonia Vaccine  Completed   Flu Shot  Completed   COVID-19 Vaccine  Completed   HPV Vaccine  Aged Out   Zoster (Shingles) Vaccine  Discontinued

## 2024-01-28 ENCOUNTER — Encounter: Payer: Self-pay | Admitting: Family Medicine

## 2024-03-07 ENCOUNTER — Ambulatory Visit (INDEPENDENT_AMBULATORY_CARE_PROVIDER_SITE_OTHER): Payer: Medicare Other

## 2024-03-07 DIAGNOSIS — I440 Atrioventricular block, first degree: Secondary | ICD-10-CM

## 2024-03-08 LAB — CUP PACEART REMOTE DEVICE CHECK
Battery Remaining Longevity: 96 mo
Battery Voltage: 2.99 V
Brady Statistic RV Percent Paced: 60.55 %
Date Time Interrogation Session: 20250408135900
Implantable Pulse Generator Implant Date: 20210128
Lead Channel Impedance Value: 530 Ohm
Lead Channel Pacing Threshold Amplitude: 0.375 V
Lead Channel Pacing Threshold Pulse Width: 0.24 ms
Lead Channel Sensing Intrinsic Amplitude: 8.663 mV
Lead Channel Setting Pacing Amplitude: 1 V
Lead Channel Setting Pacing Pulse Width: 0.24 ms
Lead Channel Setting Sensing Sensitivity: 2 mV

## 2024-04-03 ENCOUNTER — Other Ambulatory Visit: Payer: Self-pay

## 2024-04-03 DIAGNOSIS — I4892 Unspecified atrial flutter: Secondary | ICD-10-CM

## 2024-04-03 MED ORDER — APIXABAN 5 MG PO TABS
5.0000 mg | ORAL_TABLET | Freq: Two times a day (BID) | ORAL | 1 refills | Status: DC
Start: 2024-04-03 — End: 2024-09-11

## 2024-04-03 NOTE — Telephone Encounter (Signed)
 Pt last saw Dr Audery Blazing 10/18/23, last labs 01/21/24 Creat 1.22, age 89, weight 98kg, based on specified criteria pt is on appropriate dosage of Eliquis  5mg  BID for afib.  Will refill rx.

## 2024-04-11 ENCOUNTER — Other Ambulatory Visit: Payer: Self-pay | Admitting: Family Medicine

## 2024-04-25 NOTE — Progress Notes (Signed)
 Remote pacemaker transmission.

## 2024-04-25 NOTE — Addendum Note (Signed)
 Addended by: Lott Rouleau A on: 04/25/2024 03:29 PM   Modules accepted: Orders

## 2024-06-06 ENCOUNTER — Ambulatory Visit (INDEPENDENT_AMBULATORY_CARE_PROVIDER_SITE_OTHER): Payer: Self-pay

## 2024-06-06 DIAGNOSIS — I4821 Permanent atrial fibrillation: Secondary | ICD-10-CM

## 2024-06-08 LAB — CUP PACEART REMOTE DEVICE CHECK
Battery Remaining Longevity: 96 mo
Battery Voltage: 2.99 V
Brady Statistic RV Percent Paced: 62.15 %
Date Time Interrogation Session: 20250709160600
Implantable Pulse Generator Implant Date: 20210128
Lead Channel Impedance Value: 510 Ohm
Lead Channel Pacing Threshold Amplitude: 0.375 V
Lead Channel Pacing Threshold Pulse Width: 0.24 ms
Lead Channel Sensing Intrinsic Amplitude: 7.313 mV
Lead Channel Setting Pacing Amplitude: 0.875
Lead Channel Setting Pacing Pulse Width: 0.24 ms
Lead Channel Setting Sensing Sensitivity: 2 mV

## 2024-06-15 ENCOUNTER — Ambulatory Visit: Payer: Self-pay | Admitting: Cardiology

## 2024-06-28 ENCOUNTER — Telehealth: Payer: Self-pay | Admitting: Family Medicine

## 2024-06-28 MED ORDER — IRBESARTAN 150 MG PO TABS: 150.0000 mg | ORAL_TABLET | Freq: Every day | ORAL | 1 refills | Status: DC

## 2024-06-28 NOTE — Telephone Encounter (Signed)
 Copied from CRM [id number]. Topic: Clinical - Medication Refill >> Jun 28, 2024  3:25 PM Zane F wrote: Patient is calling in to have the following prescription filled by his PCP per PCP's instruction. The patient would like a 90 day supply of the medication. Patient has about a week's worth left.   Medication: irbesartan  (AVAPRO ) 150 MG tablet  Has the patient contacted their pharmacy? No, pt was aware the refills are out   This is the patient's preferred pharmacy:  [facility] [address] [place] - [address] [place] [address] Phone: [phone] Fax: [phone]  Is this the correct pharmacy for this prescription? Yes    Has the prescription been filled recently? No  Is the patient out of the medication? No  Has the patient been seen for an appointment in the last year OR does the patient have an upcoming appointment? Yes  Can we respond through MyChart? Yes  Agent: Please be advised that Rx refills may take up to 3 business days. We ask that you follow-up with your pharmacy.

## 2024-07-06 DIAGNOSIS — K080 Exfoliation of teeth due to systemic causes: Secondary | ICD-10-CM | POA: Diagnosis not present

## 2024-07-26 ENCOUNTER — Ambulatory Visit (INDEPENDENT_AMBULATORY_CARE_PROVIDER_SITE_OTHER): Admitting: Family Medicine

## 2024-07-26 ENCOUNTER — Encounter: Payer: Self-pay | Admitting: Family Medicine

## 2024-07-26 VITALS — BP 133/74 | HR 75 | Ht 70.0 in | Wt 225.0 lb

## 2024-07-26 DIAGNOSIS — M7918 Myalgia, other site: Secondary | ICD-10-CM | POA: Diagnosis not present

## 2024-07-26 DIAGNOSIS — M19042 Primary osteoarthritis, left hand: Secondary | ICD-10-CM | POA: Diagnosis not present

## 2024-07-26 DIAGNOSIS — M19049 Primary osteoarthritis, unspecified hand: Secondary | ICD-10-CM | POA: Insufficient documentation

## 2024-07-26 MED ORDER — PREDNISONE 10 MG (48) PO TBPK: ORAL_TABLET | Freq: Every day | ORAL | 0 refills | Status: DC

## 2024-07-26 NOTE — Progress Notes (Signed)
 Harry Kline - 89 y.o. male MRN [id number]  Date of birth: 28-May-1934  Subjective Chief Complaint  Patient presents with   Arthritis   Hip Pain    HPI Harry Kline is a 89 y.o. male here today with complaint of pain in the R posterior hip and buttock area.  Symptoms started a week or two ago.  Denies any known injury.  Denies radiation down the leg, weakness, numbness or tingling.  Pain improves with stretching the hip.  Tends to be worse going from sitting to standing.  No difficulty with walking.  He has not really tried anything for this at home.   He also has pain and stiffness in the L hand.  Has some difficulty with fully closing hand due to stiffness.    ROS:  A comprehensive ROS was completed and negative except as noted per HPI  No Known Allergies  Past Medical History:  Diagnosis Date   Arthritis 2010   Diagnosed but not enough to treat with medication.   Atrial fibrillation (HCC)    Atrial fibrillation (HCC)    CAD (coronary artery disease)    Cancer (HCC)    Cataract 2009   surgery both eyes   Chronic kidney disease    GERD (gastroesophageal reflux disease) 2000   Hyperlipidemia    Hypertension    OSA (obstructive sleep apnea)    Pacemaker    Medtronic-   Skin cancer    Sleep apnea 1990   c-pap machine   Status post dilation of esophageal narrowing    Well adult exam 01/20/2023    Past Surgical History:  Procedure Laterality Date   COLONOSCOPY     CORONARY ARTERY BYPASS GRAFT     November 2003   EYE SURGERY  1985   RK surgery   JOINT REPLACEMENT  June 23 2022   Rt knee replacement   SPINE SURGERY  2014   L4,L5 fusion   TONSILLECTOMY     TOTAL KNEE ARTHROPLASTY Right 06/23/2022   Procedure: RIGHT TOTAL KNEE ARTHROPLASTY;  Surgeon: Addie Cordella Hamilton, MD;  Location: [facility] OR;  Service: Orthopedics;  Laterality: Right;   UPPER GASTROINTESTINAL ENDOSCOPY      Social History   Socioeconomic History   Marital status: Widowed    Spouse name: Not on  file   Number of children: 3   Years of education: 16   Highest education level: Bachelor's degree (e.g., BA, AB, BS)  Occupational History   Occupation: Retired.   Occupation: retired  Tobacco Use   Smoking status: Former    Current packs/day: 0.00    Average packs/day: 1 pack/day for 20.0 years (20.0 ttl pk-yrs)    Types: Cigarettes    Start date: 11/30/1953    Quit date: 11/30/1973    Years since quitting: 50.6   Smokeless tobacco: Never  Vaping Use   Vaping status: Never Used  Substance and Sexual Activity   Alcohol use: Yes    Alcohol/week: 2.0 standard drinks of alcohol    Types: 2 Cans of beer per week    Comment: a beer 3-4 a week   Drug use: Never   Sexual activity: Not Currently  Other Topics Concern   Not on file  Social History Narrative   His grandson is staying with him. He has three children. He enjoys traveling.   Social Drivers of Health   Financial Resource Strain: Low Risk  (07/22/2024)   Overall Financial Resource Strain (CARDIA)    Difficulty of Paying  Living Expenses: Not hard at all  Food Insecurity: No Food Insecurity (07/22/2024)   Hunger Vital Sign    Worried About Running Out of Food in the Last Year: Never true    Ran Out of Food in the Last Year: Never true  Transportation Needs: No Transportation Needs (07/22/2024)   PRAPARE - Administrator, Civil Service (Medical): No    Lack of Transportation (Non-Medical): No  Physical Activity: Insufficiently Active (07/22/2024)   Exercise Vital Sign    Days of Exercise per Week: 3 days    Minutes of Exercise per Session: 30 min  Stress: No Stress Concern Present (07/22/2024)   [employer] of Occupational Health - Occupational Stress Questionnaire    Feeling of Stress: Not at all  Social Connections: Moderately Integrated (07/22/2024)   Social Connection and Isolation Panel    Frequency of Communication with Friends and Family: More than three times a week    Frequency of Social Gatherings  with Friends and Family: More than three times a week    Attends Religious Services: More than 4 times per year    Active Member of [employer] or Organizations: Yes    Attends Banker Meetings: More than 4 times per year    Marital Status: Widowed    Family History  Problem Relation Age of Onset   Breast cancer Mother    Cancer Mother    Heart failure Father    Heart disease Father    Healthy Sister    Diabetes Paternal Grandmother    Heart failure Paternal Grandfather    Heart disease Paternal Grandfather    Stomach cancer Neg Hx    Esophageal cancer Neg Hx    Colon cancer Neg Hx    Rectal cancer Neg Hx     Health Maintenance  Topic Date Due   COVID-19 Vaccine (6 - 2024-25 season) 02/02/2024   INFLUENZA VACCINE  06/30/2024   Medicare Annual Wellness (AWV)  01/25/2025   DTaP/Tdap/Td (3 - Td or Tdap) 02/06/2030   Pneumococcal Vaccine: 50+ Years  Completed   HPV VACCINES  Aged Out   Meningococcal B Vaccine  Aged Out   Zoster Vaccines- Shingrix  Discontinued     ----------------------------------------------------------------------------------------------------------------------------------------------------------------------------------------------------------------- Physical Exam BP 133/74 (BP Location: Left Arm, Patient Position: Sitting, Cuff Size: Normal)   Pulse 75   Ht 5' 10 (1.778 m)   Wt 225 lb (102.1 kg)   SpO2 99%   BMI 32.28 kg/m   Physical Exam Constitutional:      Appearance: Normal appearance.  Cardiovascular:     Rate and Rhythm: Normal rate and regular rhythm.  Pulmonary:     Effort: Pulmonary effort is normal.     Breath sounds: Normal breath sounds.  Musculoskeletal:     Comments: Pain located in R mid buttock area.  TTP.  SLR negative.  Strength normal b/l  Neurological:     Mental Status: He is alert.      ------------------------------------------------------------------------------------------------------------------------------------------------------------------------------------------------------------------- Assessment and Plan  Osteoarthritis of hand He can try topical voltaren  initially.  He will let me know if not improving with this.   Piriformis muscle pain Adding course of prednisone  (12 day taper) and printed HEP.  Red flags reviewed.  Referral to PT if not improving.    Meds ordered this encounter  Medications   predniSONE  (STERAPRED UNI-PAK 48 TAB) 10 MG (48) TBPK tablet    Sig: Take by mouth daily. 12-day taper pack, use as directed for taper  Dispense:  48 tablet    Refill:  0    No follow-ups on file.

## 2024-07-26 NOTE — Assessment & Plan Note (Signed)
 Adding course of prednisone  (12 day taper) and printed HEP.  Red flags reviewed.  Referral to PT if not improving.

## 2024-07-26 NOTE — Assessment & Plan Note (Signed)
 He can try topical voltaren  initially.  He will let me know if not improving with this.

## 2024-08-22 ENCOUNTER — Encounter: Payer: Self-pay | Admitting: Family Medicine

## 2024-08-22 DIAGNOSIS — H526 Other disorders of refraction: Secondary | ICD-10-CM | POA: Diagnosis not present

## 2024-08-22 DIAGNOSIS — H35033 Hypertensive retinopathy, bilateral: Secondary | ICD-10-CM | POA: Diagnosis not present

## 2024-08-22 DIAGNOSIS — M25551 Pain in right hip: Secondary | ICD-10-CM

## 2024-08-22 DIAGNOSIS — G43E19 Chronic migraine with aura, intractable, without status migrainosus: Secondary | ICD-10-CM | POA: Diagnosis not present

## 2024-08-22 DIAGNOSIS — H43813 Vitreous degeneration, bilateral: Secondary | ICD-10-CM | POA: Diagnosis not present

## 2024-08-22 DIAGNOSIS — H35372 Puckering of macula, left eye: Secondary | ICD-10-CM | POA: Diagnosis not present

## 2024-08-29 DIAGNOSIS — M799 Soft tissue disorder, unspecified: Secondary | ICD-10-CM | POA: Diagnosis not present

## 2024-08-29 DIAGNOSIS — M6281 Muscle weakness (generalized): Secondary | ICD-10-CM | POA: Diagnosis not present

## 2024-08-29 DIAGNOSIS — M25551 Pain in right hip: Secondary | ICD-10-CM | POA: Diagnosis not present

## 2024-09-01 DIAGNOSIS — M6281 Muscle weakness (generalized): Secondary | ICD-10-CM | POA: Diagnosis not present

## 2024-09-01 DIAGNOSIS — M799 Soft tissue disorder, unspecified: Secondary | ICD-10-CM | POA: Diagnosis not present

## 2024-09-01 DIAGNOSIS — M25551 Pain in right hip: Secondary | ICD-10-CM | POA: Diagnosis not present

## 2024-09-05 ENCOUNTER — Ambulatory Visit: Payer: Self-pay

## 2024-09-05 DIAGNOSIS — M6281 Muscle weakness (generalized): Secondary | ICD-10-CM | POA: Diagnosis not present

## 2024-09-05 DIAGNOSIS — I4821 Permanent atrial fibrillation: Secondary | ICD-10-CM

## 2024-09-05 DIAGNOSIS — M799 Soft tissue disorder, unspecified: Secondary | ICD-10-CM | POA: Diagnosis not present

## 2024-09-05 DIAGNOSIS — M25551 Pain in right hip: Secondary | ICD-10-CM | POA: Diagnosis not present

## 2024-09-07 LAB — CUP PACEART REMOTE DEVICE CHECK
Battery Remaining Longevity: 96 mo
Battery Voltage: 2.99 V
Brady Statistic RV Percent Paced: 61.17 %
Date Time Interrogation Session: 20251007194300
Implantable Pulse Generator Implant Date: 20210128
Lead Channel Impedance Value: 480 Ohm
Lead Channel Pacing Threshold Amplitude: 0.375 V
Lead Channel Pacing Threshold Pulse Width: 0.24 ms
Lead Channel Sensing Intrinsic Amplitude: 7.425 mV
Lead Channel Setting Pacing Amplitude: 1 V
Lead Channel Setting Pacing Pulse Width: 0.24 ms
Lead Channel Setting Sensing Sensitivity: 2 mV

## 2024-09-08 DIAGNOSIS — M25551 Pain in right hip: Secondary | ICD-10-CM | POA: Diagnosis not present

## 2024-09-08 DIAGNOSIS — M6281 Muscle weakness (generalized): Secondary | ICD-10-CM | POA: Diagnosis not present

## 2024-09-08 DIAGNOSIS — M799 Soft tissue disorder, unspecified: Secondary | ICD-10-CM | POA: Diagnosis not present

## 2024-09-08 NOTE — Progress Notes (Signed)
 Remote PPM Transmission

## 2024-09-10 ENCOUNTER — Ambulatory Visit: Payer: Self-pay | Admitting: Cardiology

## 2024-09-11 ENCOUNTER — Other Ambulatory Visit: Payer: Self-pay | Admitting: *Deleted

## 2024-09-11 DIAGNOSIS — I480 Paroxysmal atrial fibrillation: Secondary | ICD-10-CM

## 2024-09-11 DIAGNOSIS — I4892 Unspecified atrial flutter: Secondary | ICD-10-CM

## 2024-09-11 MED ORDER — APIXABAN 5 MG PO TABS: 5.0000 mg | ORAL_TABLET | Freq: Two times a day (BID) | ORAL | 1 refills | Status: AC

## 2024-09-11 NOTE — Telephone Encounter (Signed)
 Eliquis  5mg  refill request received. Patient is 89 years old, weight-102.1kg, Crea-1.22 on 01/21/24, Diagnosis-Afib/flutter, and last seen by Dr. Pietro on 10/18/23. Dose is appropriate based on dosing criteria. Will send in refill to requested pharmacy.

## 2024-09-12 DIAGNOSIS — M799 Soft tissue disorder, unspecified: Secondary | ICD-10-CM | POA: Diagnosis not present

## 2024-09-12 DIAGNOSIS — M6281 Muscle weakness (generalized): Secondary | ICD-10-CM | POA: Diagnosis not present

## 2024-09-12 DIAGNOSIS — M25551 Pain in right hip: Secondary | ICD-10-CM | POA: Diagnosis not present

## 2024-09-13 ENCOUNTER — Encounter: Payer: Self-pay | Admitting: Family Medicine

## 2024-09-15 ENCOUNTER — Ambulatory Visit (INDEPENDENT_AMBULATORY_CARE_PROVIDER_SITE_OTHER)

## 2024-09-15 VITALS — Temp 97.6°F

## 2024-09-15 DIAGNOSIS — Z23 Encounter for immunization: Secondary | ICD-10-CM

## 2024-09-15 DIAGNOSIS — M25551 Pain in right hip: Secondary | ICD-10-CM | POA: Diagnosis not present

## 2024-09-15 DIAGNOSIS — M799 Soft tissue disorder, unspecified: Secondary | ICD-10-CM | POA: Diagnosis not present

## 2024-09-15 DIAGNOSIS — M6281 Muscle weakness (generalized): Secondary | ICD-10-CM | POA: Diagnosis not present

## 2024-09-19 DIAGNOSIS — M6281 Muscle weakness (generalized): Secondary | ICD-10-CM | POA: Diagnosis not present

## 2024-09-19 DIAGNOSIS — M799 Soft tissue disorder, unspecified: Secondary | ICD-10-CM | POA: Diagnosis not present

## 2024-09-19 DIAGNOSIS — M25551 Pain in right hip: Secondary | ICD-10-CM | POA: Diagnosis not present

## 2024-09-22 DIAGNOSIS — M6281 Muscle weakness (generalized): Secondary | ICD-10-CM | POA: Diagnosis not present

## 2024-09-22 DIAGNOSIS — M799 Soft tissue disorder, unspecified: Secondary | ICD-10-CM | POA: Diagnosis not present

## 2024-09-22 DIAGNOSIS — M25551 Pain in right hip: Secondary | ICD-10-CM | POA: Diagnosis not present

## 2024-09-26 DIAGNOSIS — M6281 Muscle weakness (generalized): Secondary | ICD-10-CM | POA: Diagnosis not present

## 2024-09-26 DIAGNOSIS — M799 Soft tissue disorder, unspecified: Secondary | ICD-10-CM | POA: Diagnosis not present

## 2024-09-26 DIAGNOSIS — M25551 Pain in right hip: Secondary | ICD-10-CM | POA: Diagnosis not present

## 2024-09-29 DIAGNOSIS — M25551 Pain in right hip: Secondary | ICD-10-CM | POA: Diagnosis not present

## 2024-09-29 DIAGNOSIS — M799 Soft tissue disorder, unspecified: Secondary | ICD-10-CM | POA: Diagnosis not present

## 2024-09-29 DIAGNOSIS — M6281 Muscle weakness (generalized): Secondary | ICD-10-CM | POA: Diagnosis not present

## 2024-10-09 DIAGNOSIS — Z85828 Personal history of other malignant neoplasm of skin: Secondary | ICD-10-CM | POA: Diagnosis not present

## 2024-10-09 DIAGNOSIS — L570 Actinic keratosis: Secondary | ICD-10-CM | POA: Diagnosis not present

## 2024-10-09 DIAGNOSIS — L720 Epidermal cyst: Secondary | ICD-10-CM | POA: Diagnosis not present

## 2024-10-09 DIAGNOSIS — Z129 Encounter for screening for malignant neoplasm, site unspecified: Secondary | ICD-10-CM | POA: Diagnosis not present

## 2024-10-09 DIAGNOSIS — L821 Other seborrheic keratosis: Secondary | ICD-10-CM | POA: Diagnosis not present

## 2024-10-18 NOTE — Progress Notes (Signed)
 HPI: FU CAD and atrial fibrillation. He previously lived in [place] ; S/P coronary artery bypass and graft approximately 15 years ago. Also with history of permanent atrial fibrillation/flutter and has had previous pacemaker.  Echocardiogram December 2021 showed normal LV function, severe left atrial enlargement, mild right atrial enlargement, mild mitral regurgitation.  Abdominal ultrasound August 2024 showed no aneurysm.  Since last seen patient denies dyspnea, chest pain, palpitations, syncope or bleeding.  Current Outpatient Medications  Medication Sig Dispense Refill   apixaban  (ELIQUIS ) 5 MG TABS tablet Take 1 tablet (5 mg total) by mouth 2 (two) times daily. 180 tablet 1   Ascorbic Acid (VITAMIN C) 1000 MG tablet Take 1,000 mg by mouth 4 (four) times a week.     atorvastatin  (LIPITOR) 20 MG tablet Take 1 tablet by mouth once daily 90 tablet 3   b complex vitamins capsule Take 1 capsule by mouth daily.     Coenzyme Q10 (CO Q 10) 100 MG CAPS Take 100 mg by mouth daily.     docusate sodium  (COLACE) 100 MG capsule Take 100 mg by mouth 2 (two) times daily.     furosemide  (LASIX ) 40 MG tablet Take 1 tablet by mouth once daily 90 tablet 3   irbesartan  (AVAPRO ) 150 MG tablet Take 1 tablet (150 mg total) by mouth daily. 90 tablet 1   Melatonin 10 MG CAPS Take 10 mg by mouth at bedtime.     Multiple Vitamin (MULTIVITAMIN) tablet Take 1 tablet by mouth daily.     potassium chloride  (KLOR-CON ) 10 MEQ tablet Take 1 tablet by mouth once daily 90 tablet 3   tamsulosin  (FLOMAX ) 0.4 MG CAPS capsule Take 1 capsule by mouth once daily 90 capsule 3   No current facility-administered medications for this visit.     Past Medical History:  Diagnosis Date   Arthritis 2010   Diagnosed but not enough to treat with medication.   Atrial fibrillation (HCC)    Atrial fibrillation (HCC)    CAD (coronary artery disease)    Cancer (HCC)    Cataract 2009   surgery both eyes   Chronic kidney disease    GERD  (gastroesophageal reflux disease) 2000   Hyperlipidemia    Hypertension    OSA (obstructive sleep apnea)    Pacemaker    Medtronic-   Skin cancer    Sleep apnea 1990   c-pap machine   Status post dilation of esophageal narrowing    Well adult exam 01/20/2023    Past Surgical History:  Procedure Laterality Date   COLONOSCOPY     CORONARY ARTERY BYPASS GRAFT     November 2003   EYE SURGERY  1985   RK surgery   JOINT REPLACEMENT  June 23 2022   Rt knee replacement   SPINE SURGERY  2014   L4,L5 fusion   TONSILLECTOMY     TOTAL KNEE ARTHROPLASTY Right 06/23/2022   Procedure: RIGHT TOTAL KNEE ARTHROPLASTY;  Surgeon: Addie Cordella Hamilton, MD;  Location: [facility] OR;  Service: Orthopedics;  Laterality: Right;   UPPER GASTROINTESTINAL ENDOSCOPY      Social History   Socioeconomic History   Marital status: Widowed    Spouse name: Not on file   Number of children: 3   Years of education: 16   Highest education level: Bachelor's degree (e.g., BA, AB, BS)  Occupational History   Occupation: Retired.   Occupation: retired  Tobacco Use   Smoking status: Former    Current packs/day:  0.00    Average packs/day: 1 pack/day for 20.0 years (20.0 ttl pk-yrs)    Types: Cigarettes    Start date: 11/30/1953    Quit date: 11/30/1973    Years since quitting: 50.9   Smokeless tobacco: Never  Vaping Use   Vaping status: Never Used  Substance and Sexual Activity   Alcohol use: Yes    Alcohol/week: 2.0 standard drinks of alcohol    Types: 2 Cans of beer per week    Comment: a beer 3-4 a week   Drug use: Never   Sexual activity: Not Currently  Other Topics Concern   Not on file  Social History Narrative   His grandson is staying with him. He has three children. He enjoys traveling.   Social Drivers of Corporate Investment Banker Strain: Low Risk  (07/22/2024)   Overall Financial Resource Strain (CARDIA)    Difficulty of Paying Living Expenses: Not hard at all  Food Insecurity: No Food  Insecurity (07/22/2024)   Hunger Vital Sign    Worried About Running Out of Food in the Last Year: Never true    Ran Out of Food in the Last Year: Never true  Transportation Needs: No Transportation Needs (07/22/2024)   PRAPARE - Administrator, Civil Service (Medical): No    Lack of Transportation (Non-Medical): No  Physical Activity: Insufficiently Active (07/22/2024)   Exercise Vital Sign    Days of Exercise per Week: 3 days    Minutes of Exercise per Session: 30 min  Stress: No Stress Concern Present (07/22/2024)   [employer] of Occupational Health - Occupational Stress Questionnaire    Feeling of Stress: Not at all  Social Connections: Moderately Integrated (07/22/2024)   Social Connection and Isolation Panel    Frequency of Communication with Friends and Family: More than three times a week    Frequency of Social Gatherings with Friends and Family: More than three times a week    Attends Religious Services: More than 4 times per year    Active Member of [employer] or Organizations: Yes    Attends Banker Meetings: More than 4 times per year    Marital Status: Widowed  Intimate Partner Violence: Not At Risk (01/26/2024)   Humiliation, Afraid, Rape, and Kick questionnaire    Fear of Current or Ex-Partner: No    Emotionally Abused: No    Physically Abused: No    Sexually Abused: No    Family History  Problem Relation Age of Onset   Breast cancer Mother    Cancer Mother    Heart failure Father    Heart disease Father    Healthy Sister    Diabetes Paternal Grandmother    Heart failure Paternal Grandfather    Heart disease Paternal Grandfather    Stomach cancer Neg Hx    Esophageal cancer Neg Hx    Colon cancer Neg Hx    Rectal cancer Neg Hx     ROS: no fevers or chills, productive cough, hemoptysis, dysphasia, odynophagia, melena, hematochezia, dysuria, hematuria, rash, seizure activity, orthopnea, PND, pedal edema, claudication. Remaining systems  are negative.  Physical Exam: Well-developed well-nourished in no acute distress.  Skin is warm and dry.  HEENT is normal.  Neck is supple.  Chest is clear to auscultation with normal expansion.  Cardiovascular exam is regular rate and rhythm.  Abdominal exam nontender or distended. No masses palpated. Extremities show no edema. neuro grossly intact  EKG Interpretation Date/Time:  Wednesday  October 25 2024 14:09:52 EST Ventricular Rate:  79 PR Interval:    QRS Duration:  162 QT Interval:  432 QTC Calculation: 495 R Axis:   110  Text Interpretation: Ventricular-paced rhythm Confirmed by Pietro Rogue ([id number]) on 10/25/2024 2:11:49 PM    A/P  1 coronary artery disease status post coronary bypass and graft-patient denies chest pain.  Continue statin.  2 hypertension-blood pressure controlled.  Continue present medical regimen.  3 hyperlipidemia-continue statin.  4 permanent atrial fibrillation-continue apixaban .  5 status post permanent pacemaker-per EP.  6 lower extremity edema-reasonably well-controlled.  Continue diuretic.  Rogue Pietro, MD

## 2024-10-23 ENCOUNTER — Telehealth: Payer: Self-pay | Admitting: Pharmacy Technician

## 2024-10-23 NOTE — Telephone Encounter (Signed)
   Patient does not have heart failure/cardiomyopathy  [facility] sent application for 2026

## 2024-10-25 ENCOUNTER — Encounter: Payer: Self-pay | Admitting: Cardiology

## 2024-10-25 ENCOUNTER — Ambulatory Visit: Admitting: Cardiology

## 2024-10-25 VITALS — BP 123/73 | HR 79 | Ht 70.0 in | Wt 220.0 lb

## 2024-10-25 DIAGNOSIS — E7800 Pure hypercholesterolemia, unspecified: Secondary | ICD-10-CM

## 2024-10-25 DIAGNOSIS — I4821 Permanent atrial fibrillation: Secondary | ICD-10-CM | POA: Diagnosis not present

## 2024-10-25 DIAGNOSIS — Z950 Presence of cardiac pacemaker: Secondary | ICD-10-CM

## 2024-10-25 DIAGNOSIS — I10 Essential (primary) hypertension: Secondary | ICD-10-CM | POA: Diagnosis not present

## 2024-10-25 DIAGNOSIS — I2510 Atherosclerotic heart disease of native coronary artery without angina pectoris: Secondary | ICD-10-CM | POA: Diagnosis not present

## 2024-10-25 DIAGNOSIS — I480 Paroxysmal atrial fibrillation: Secondary | ICD-10-CM

## 2024-10-25 NOTE — Patient Instructions (Signed)

## 2024-12-05 ENCOUNTER — Ambulatory Visit: Payer: Self-pay

## 2024-12-05 DIAGNOSIS — I4821 Permanent atrial fibrillation: Secondary | ICD-10-CM | POA: Diagnosis not present

## 2024-12-07 ENCOUNTER — Ambulatory Visit: Payer: Self-pay | Admitting: Cardiology

## 2024-12-07 LAB — CUP PACEART REMOTE DEVICE CHECK
Battery Remaining Longevity: 96 mo
Battery Voltage: 2.99 V
Brady Statistic RV Percent Paced: 61.43 %
Date Time Interrogation Session: 20260107143100
Implantable Pulse Generator Implant Date: 20210128
Lead Channel Impedance Value: 520 Ohm
Lead Channel Pacing Threshold Amplitude: 0.375 V
Lead Channel Pacing Threshold Pulse Width: 0.24 ms
Lead Channel Sensing Intrinsic Amplitude: 7.763 mV
Lead Channel Setting Pacing Amplitude: 1 V
Lead Channel Setting Pacing Pulse Width: 0.24 ms
Lead Channel Setting Sensing Sensitivity: 2 mV

## 2024-12-07 NOTE — Progress Notes (Signed)
 Remote PPM Transmission

## 2025-01-03 ENCOUNTER — Other Ambulatory Visit: Payer: Self-pay | Admitting: Family Medicine

## 2025-01-03 DIAGNOSIS — I10 Essential (primary) hypertension: Secondary | ICD-10-CM

## 2025-01-26 ENCOUNTER — Ambulatory Visit: Payer: Medicare Other | Admitting: Family Medicine

## 2025-01-29 ENCOUNTER — Ambulatory Visit: Payer: Medicare Other

## 2025-02-01 ENCOUNTER — Encounter
